# Patient Record
Sex: Male | Born: 1950 | Race: Black or African American | Hispanic: No | Marital: Married | Smoking: Former smoker
Health system: Southern US, Community
[De-identification: ages and names within clinical notes are randomized; demographics above are authoritative.]

## PROBLEM LIST (undated history)

## (undated) DIAGNOSIS — E785 Hyperlipidemia, unspecified: Secondary | ICD-10-CM

## (undated) DIAGNOSIS — I10 Essential (primary) hypertension: Secondary | ICD-10-CM

## (undated) DIAGNOSIS — Z973 Presence of spectacles and contact lenses: Secondary | ICD-10-CM

## (undated) DIAGNOSIS — H269 Unspecified cataract: Secondary | ICD-10-CM

## (undated) DIAGNOSIS — K219 Gastro-esophageal reflux disease without esophagitis: Secondary | ICD-10-CM

## (undated) DIAGNOSIS — H409 Unspecified glaucoma: Secondary | ICD-10-CM

## (undated) DIAGNOSIS — Z972 Presence of dental prosthetic device (complete) (partial): Secondary | ICD-10-CM

## (undated) DIAGNOSIS — M1990 Unspecified osteoarthritis, unspecified site: Secondary | ICD-10-CM

## (undated) DIAGNOSIS — F419 Anxiety disorder, unspecified: Secondary | ICD-10-CM

## (undated) DIAGNOSIS — D649 Anemia, unspecified: Secondary | ICD-10-CM

## (undated) DIAGNOSIS — N186 End stage renal disease: Secondary | ICD-10-CM

## (undated) DIAGNOSIS — J302 Other seasonal allergic rhinitis: Secondary | ICD-10-CM

## (undated) HISTORY — PX: MULTIPLE TOOTH EXTRACTIONS: SHX2053

## (undated) HISTORY — DX: Anemia, unspecified: D64.9

## (undated) HISTORY — PX: COLONOSCOPY: SHX174

## (undated) HISTORY — PX: HERNIA REPAIR: SHX51

---

## 2009-11-19 ENCOUNTER — Inpatient Hospital Stay (HOSPITAL_COMMUNITY): Admission: EM | Admit: 2009-11-19 | Discharge: 2009-12-13 | Payer: Self-pay | Admitting: Emergency Medicine

## 2009-11-19 ENCOUNTER — Ambulatory Visit: Payer: Self-pay | Admitting: Critical Care Medicine

## 2009-11-21 ENCOUNTER — Encounter (INDEPENDENT_AMBULATORY_CARE_PROVIDER_SITE_OTHER): Payer: Self-pay

## 2009-11-30 ENCOUNTER — Encounter (INDEPENDENT_AMBULATORY_CARE_PROVIDER_SITE_OTHER): Payer: Self-pay

## 2009-12-11 ENCOUNTER — Ambulatory Visit: Payer: Self-pay | Admitting: Vascular Surgery

## 2009-12-13 ENCOUNTER — Inpatient Hospital Stay: Admission: EM | Admit: 2009-12-13 | Discharge: 2010-01-03 | Payer: Self-pay | Admitting: Internal Medicine

## 2009-12-14 ENCOUNTER — Ambulatory Visit: Payer: Self-pay | Admitting: Infectious Diseases

## 2010-01-16 ENCOUNTER — Ambulatory Visit: Payer: Self-pay | Admitting: Vascular Surgery

## 2010-02-06 ENCOUNTER — Ambulatory Visit (HOSPITAL_COMMUNITY): Admission: RE | Admit: 2010-02-06 | Discharge: 2010-02-07 | Payer: Self-pay | Admitting: Surgery

## 2010-02-06 ENCOUNTER — Encounter (INDEPENDENT_AMBULATORY_CARE_PROVIDER_SITE_OTHER): Payer: Self-pay | Admitting: Surgery

## 2010-08-30 IMAGING — CR DG CHEST 2V
2 series · 2 of 2 positions shown · non-contrast
Comparison: 12/11/2009

CLINICAL DATA: Elevated white blood cell count.

CHEST - 2 VIEW

[w chest pa]
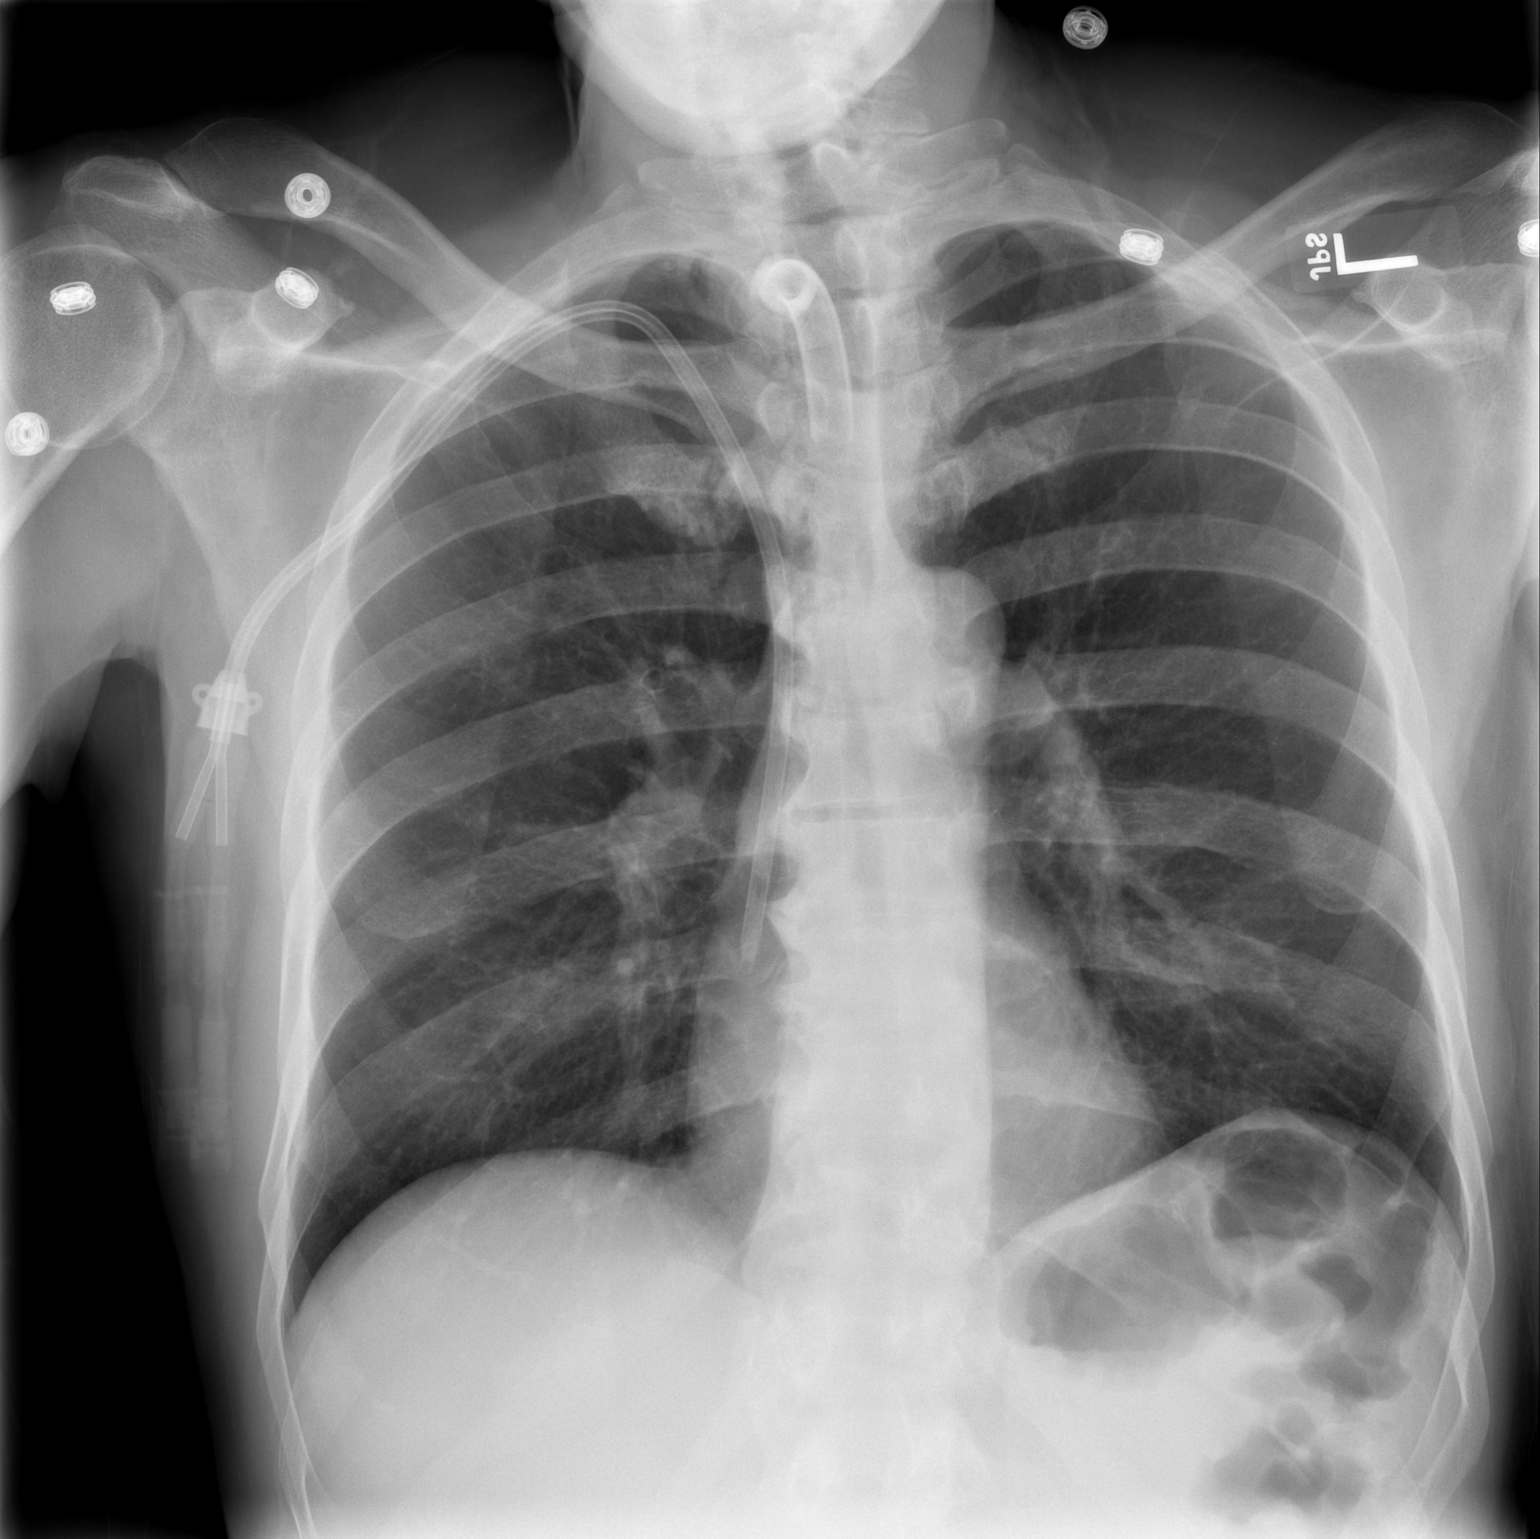

[w chest lat]
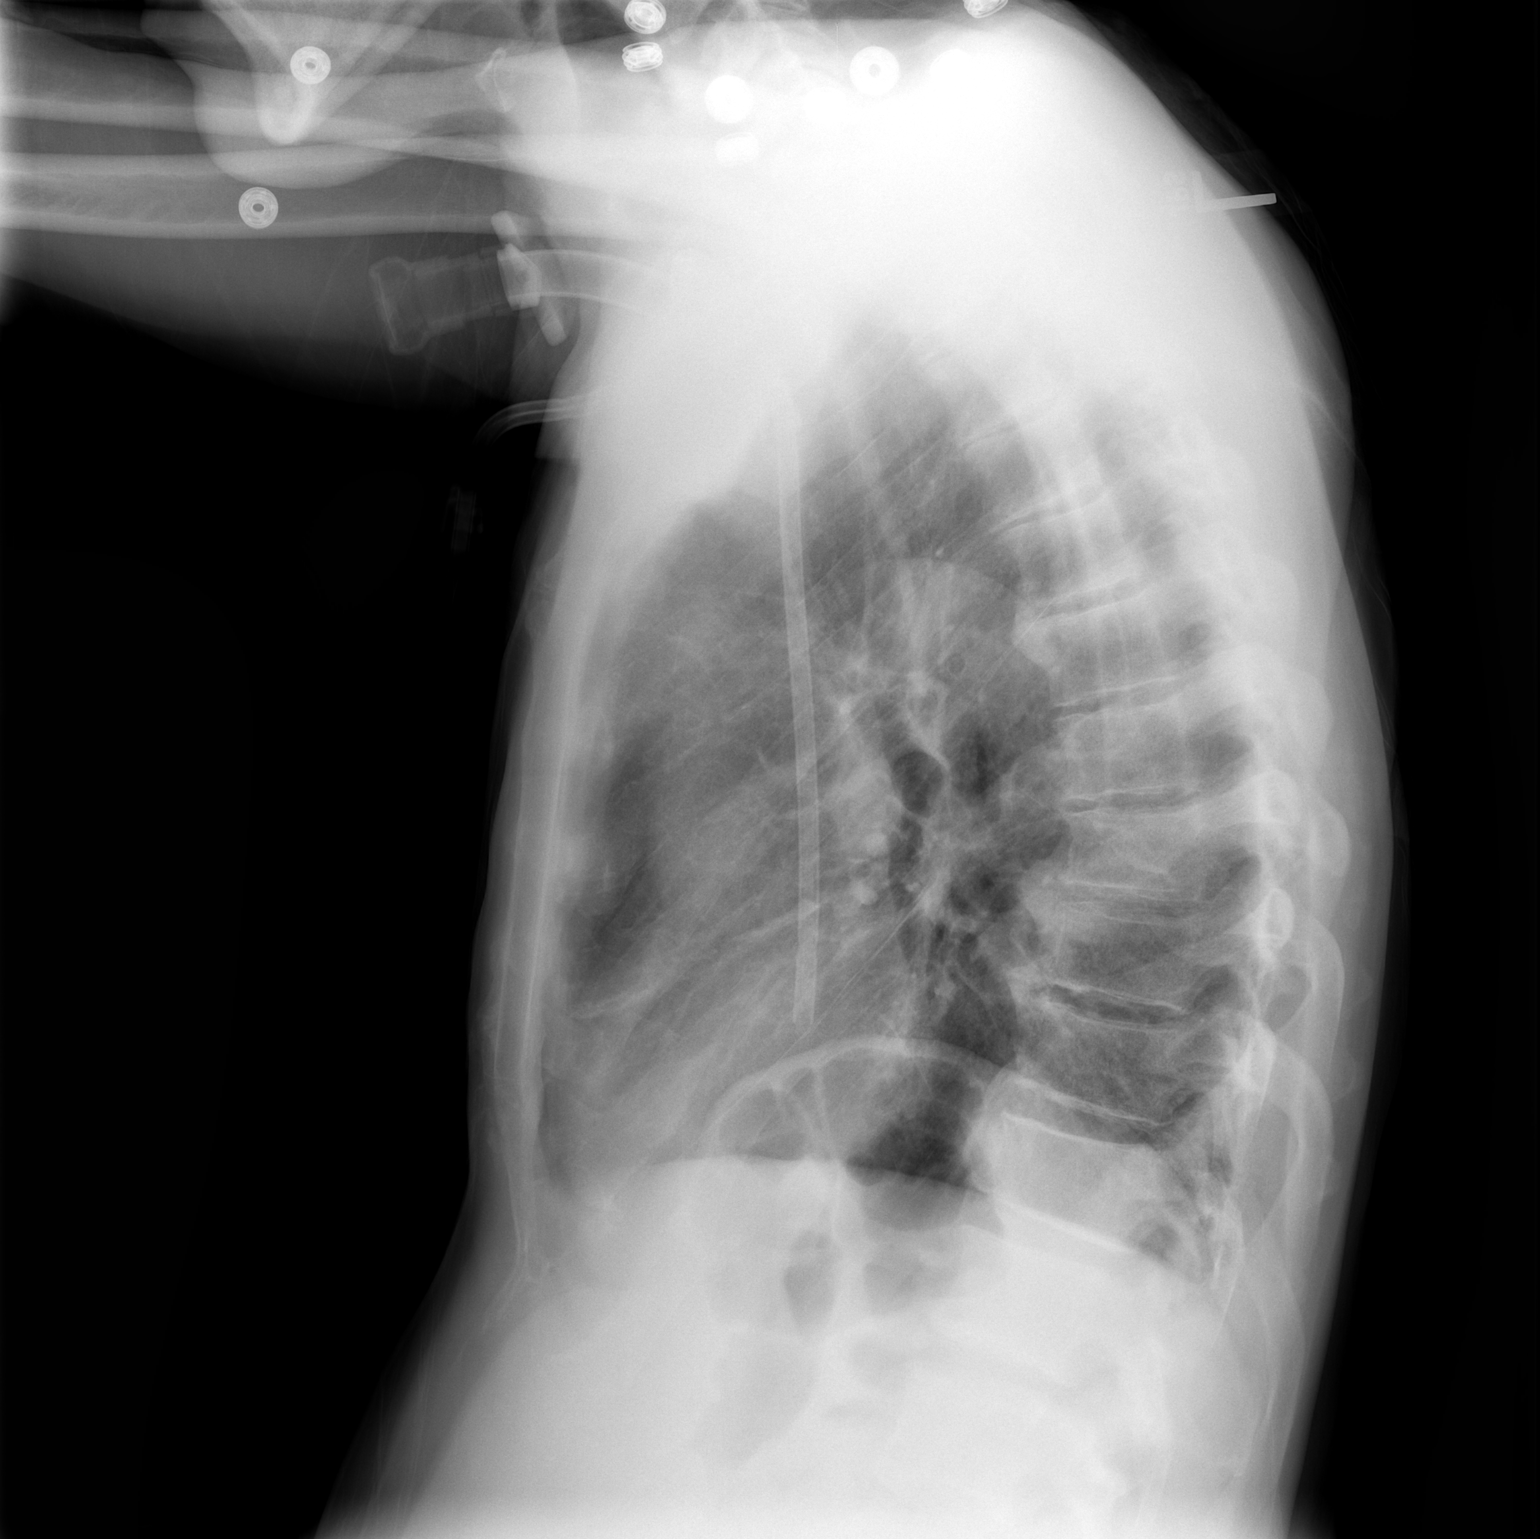

[2 of 2 positions shown; findings below may reference images not displayed]

FINDINGS: Right dialysis catheter and tracheostomy tube unchanged.
Heart and mediastinal contours are within normal limits.  No focal
opacities or effusions.  No acute bony abnormality.
IMPRESSION: No acute cardiopulmonary disease.

## 2010-09-01 IMAGING — NM NM LIVER FUNCTION STUDY
2 series · 12 of 12 positions shown · non-contrast
Comparison: Ultrasound 12/15/2009

CLINICAL DATA: Abdominal pain.  Evaluate for acute cholecystitis.

NUCLEAR MEDICINE HEPATOBILIARY IMAGING
TECHNIQUE: Sequential images of the abdomen were obtained [DATE] minutes following intravenous administration of
radiopharmaceutical.
Radiopharmaceutical:  5.0 mCi 3c-LLm Choletec

[he hepatobiliary · 3.43mm/px · 6 of 60 frames shown (1 of 2)]
[frame 6/60]
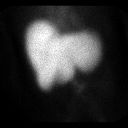
[frame 16/60]
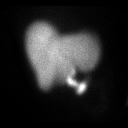
[frame 26/60]
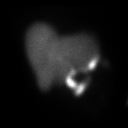
[frame 36/60]
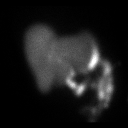
[frame 46/60]
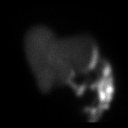
[frame 56/60]
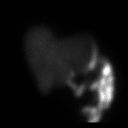

[he hepatobiliary · 3.43mm/px · 6 of 28 frames shown (2 of 2)]
[frame 3/28]
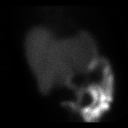
[frame 7/28]
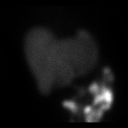
[frame 12/28]
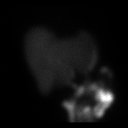
[frame 17/28]
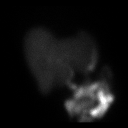
[frame 21/28]
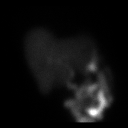
[frame 26/28]
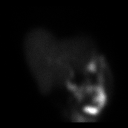

[12 of 12 positions shown; findings below may reference images not displayed]

FINDINGS: There is prompt uptake and excretion of radiotracer by
the liver.  Activity is seen in small bowel by 25 minutes.  No
filling of the gallbladder noted at 1 hour.  Therefore, the patient
was given 2.8 mg of morphine IV.  After 30 minutes, there is still
no filling of the gallbladder.
IMPRESSION: Nonfilling of the gallbladder despite morphine administration.
Findings compatible with cystic duct obstruction.

## 2011-01-08 LAB — COMPREHENSIVE METABOLIC PANEL
ALT: 21 U/L (ref 0–53)
AST: 25 U/L (ref 0–37)
Albumin: 4.6 g/dL (ref 3.5–5.2)
BUN: 7 mg/dL (ref 6–23)
Chloride: 102 mEq/L (ref 96–112)
Creatinine, Ser: 1 mg/dL (ref 0.4–1.5)
GFR calc non Af Amer: >60 mL/min (ref >60)
Sodium: 136 mEq/L (ref 135–145)
Total Bilirubin: 1 mg/dL (ref 0.3–1.2)

## 2011-01-08 LAB — DIFFERENTIAL
Basophils Absolute: 0 10*3/uL (ref 0.0–0.1)
Eosinophils Absolute: 0 10*3/uL (ref 0.0–0.7)
Eosinophils Relative: 0 % (ref 0–5)
Eosinophils Relative: 0 % (ref 0–5)
Lymphocytes Relative: 2 % — ABNORMAL LOW (ref 12–46)
Lymphs Abs: 0.1 10*3/uL — ABNORMAL LOW (ref 0.7–4.0)
Lymphs Abs: 0.4 10*3/uL — ABNORMAL LOW (ref 0.7–4.0)
Monocytes Absolute: 0.1 10*3/uL (ref 0.1–1.0)
Monocytes Relative: 2 % — ABNORMAL LOW (ref 3–12)
Neutrophils Relative %: 82 % — ABNORMAL HIGH (ref 43–77)

## 2011-01-08 LAB — BLOOD GAS, ARTERIAL
Acid-base deficit: 0.5 mmol/L (ref 0.0–2.0)
Drawn by: 13898
FIO2: 0.5 %
MECHVT: 600 mL
MECHVT: 600 mL
O2 Saturation: 84 %
PEEP: 8 cmH2O
Patient temperature: 101.6
RATE: 15 resp/min
TCO2: 24.9 mmol/L (ref 0–100)
TCO2: 25.5 mmol/L (ref 0–100)
pCO2 arterial: 43.5 mmHg (ref 35.0–45.0)
pCO2 arterial: 50.1 mmHg — ABNORMAL HIGH (ref 35.0–45.0)
pH, Arterial: 7.313 — ABNORMAL LOW (ref 7.350–7.450)
pO2, Arterial: 55.9 mmHg — ABNORMAL LOW (ref 80.0–100.0)

## 2011-01-08 LAB — CBC
HCT: 39 % (ref 39.0–52.0)
HCT: 42.5 % (ref 39.0–52.0)
Hemoglobin: 13.4 g/dL (ref 13.0–17.0)
Hemoglobin: 14.2 g/dL (ref 13.0–17.0)
MCHC: 33.5 g/dL (ref 30.0–36.0)
MCHC: 34.5 g/dL (ref 30.0–36.0)
MCV: 95.8 fL (ref 78.0–100.0)
Platelets: 128 10*3/uL — ABNORMAL LOW (ref 150–400)
RBC: 4 MIL/uL — ABNORMAL LOW (ref 4.22–5.81)
RBC: 4.44 MIL/uL (ref 4.22–5.81)
RDW: 12.1 % (ref 11.5–15.5)
RDW: 12.2 % (ref 11.5–15.5)
WBC: 5.8 10*3/uL (ref 4.0–10.5)

## 2011-01-08 LAB — CULTURE, BAL-QUANTITATIVE W GRAM STAIN: Colony Count: >=100000

## 2011-01-08 LAB — BASIC METABOLIC PANEL
BUN: 7 mg/dL (ref 6–23)
Calcium: 7.9 mg/dL — ABNORMAL LOW (ref 8.4–10.5)
Chloride: 106 mEq/L (ref 96–112)
Creatinine, Ser: 1.16 mg/dL (ref 0.4–1.5)
GFR calc Af Amer: >60 mL/min (ref >60)

## 2011-01-08 LAB — CULTURE, BLOOD (ROUTINE X 2)

## 2011-01-09 LAB — COMPREHENSIVE METABOLIC PANEL
Alkaline Phosphatase: 81 U/L (ref 39–117)
BUN: 11 mg/dL (ref 6–23)
Calcium: 8.2 mg/dL — ABNORMAL LOW (ref 8.4–10.5)
Creatinine, Ser: 1.9 mg/dL — ABNORMAL HIGH (ref 0.4–1.5)
Glucose, Bld: 78 mg/dL (ref 70–99)
Total Protein: 4.7 g/dL — ABNORMAL LOW (ref 6.0–8.3)

## 2011-01-09 LAB — CBC
HCT: 33.1 % — ABNORMAL LOW (ref 39.0–52.0)
Hemoglobin: 11.1 g/dL — ABNORMAL LOW (ref 13.0–17.0)
MCHC: 33.6 g/dL (ref 30.0–36.0)
MCV: 90.1 fL (ref 78.0–100.0)
RDW: 15.8 % — ABNORMAL HIGH (ref 11.5–15.5)

## 2011-01-10 LAB — DIFFERENTIAL
Basophils Absolute: 0 10*3/uL (ref 0.0–0.1)
Basophils Relative: 0 % (ref 0–1)
Eosinophils Absolute: 0.2 10*3/uL (ref 0.0–0.7)
Eosinophils Relative: 3 % (ref 0–5)
Lymphocytes Relative: 32 % (ref 12–46)

## 2011-01-10 LAB — COMPREHENSIVE METABOLIC PANEL
ALT: 24 U/L (ref 0–53)
AST: 21 U/L (ref 0–37)
CO2: 26 mEq/L (ref 19–32)
Chloride: 102 mEq/L (ref 96–112)
Creatinine, Ser: 2.09 mg/dL — ABNORMAL HIGH (ref 0.4–1.5)
GFR calc Af Amer: 40 mL/min — ABNORMAL LOW (ref >60)
GFR calc non Af Amer: 33 mL/min — ABNORMAL LOW (ref >60)
Total Bilirubin: 0.6 mg/dL (ref 0.3–1.2)

## 2011-01-10 LAB — CBC
HCT: 38.6 % — ABNORMAL LOW (ref 39.0–52.0)
MCV: 90.6 fL (ref 78.0–100.0)
RBC: 4.26 MIL/uL (ref 4.22–5.81)
WBC: 6.5 10*3/uL (ref 4.0–10.5)

## 2011-01-11 LAB — BASIC METABOLIC PANEL
BUN: 14 mg/dL (ref 6–23)
BUN: 19 mg/dL (ref 6–23)
BUN: 34 mg/dL — ABNORMAL HIGH (ref 6–23)
BUN: 56 mg/dL — ABNORMAL HIGH (ref 6–23)
BUN: 72 mg/dL — ABNORMAL HIGH (ref 6–23)
BUN: 88 mg/dL — ABNORMAL HIGH (ref 6–23)
CO2: 19 mEq/L (ref 19–32)
CO2: 22 mEq/L (ref 19–32)
CO2: 23 mEq/L (ref 19–32)
CO2: 24 mEq/L (ref 19–32)
CO2: 24 mEq/L (ref 19–32)
CO2: 30 mEq/L (ref 19–32)
Calcium: 7 mg/dL — ABNORMAL LOW (ref 8.4–10.5)
Calcium: 7.5 mg/dL — ABNORMAL LOW (ref 8.4–10.5)
Calcium: 7.5 mg/dL — ABNORMAL LOW (ref 8.4–10.5)
Calcium: 7.5 mg/dL — ABNORMAL LOW (ref 8.4–10.5)
Calcium: 8.1 mg/dL — ABNORMAL LOW (ref 8.4–10.5)
Calcium: 8.2 mg/dL — ABNORMAL LOW (ref 8.4–10.5)
Calcium: 8.6 mg/dL (ref 8.4–10.5)
Calcium: 8.7 mg/dL (ref 8.4–10.5)
Chloride: 100 mEq/L (ref 96–112)
Chloride: 111 mEq/L (ref 96–112)
Chloride: 111 mEq/L (ref 96–112)
Chloride: 112 mEq/L (ref 96–112)
Chloride: 112 mEq/L (ref 96–112)
Chloride: 97 mEq/L (ref 96–112)
Chloride: 97 mEq/L (ref 96–112)
Chloride: 99 mEq/L (ref 96–112)
Creatinine, Ser: 1.36 mg/dL (ref 0.4–1.5)
Creatinine, Ser: 11.66 mg/dL — ABNORMAL HIGH (ref 0.4–1.5)
Creatinine, Ser: 2.33 mg/dL — ABNORMAL HIGH (ref 0.4–1.5)
Creatinine, Ser: 5.21 mg/dL — ABNORMAL HIGH (ref 0.4–1.5)
Creatinine, Ser: 8.23 mg/dL — ABNORMAL HIGH (ref 0.4–1.5)
Creatinine, Ser: 8.64 mg/dL — ABNORMAL HIGH (ref 0.4–1.5)
Creatinine, Ser: 9.42 mg/dL — ABNORMAL HIGH (ref 0.4–1.5)
GFR calc Af Amer: 12 mL/min — ABNORMAL LOW (ref >60)
GFR calc Af Amer: 14 mL/min — ABNORMAL LOW (ref >60)
GFR calc Af Amer: 15 mL/min — ABNORMAL LOW (ref >60)
GFR calc Af Amer: 5 mL/min — ABNORMAL LOW (ref >60)
GFR calc Af Amer: 6 mL/min — ABNORMAL LOW (ref >60)
GFR calc Af Amer: 7 mL/min — ABNORMAL LOW (ref >60)
GFR calc Af Amer: 8 mL/min — ABNORMAL LOW (ref >60)
GFR calc Af Amer: 8 mL/min — ABNORMAL LOW (ref >60)
GFR calc non Af Amer: 10 mL/min — ABNORMAL LOW (ref >60)
GFR calc non Af Amer: 14 mL/min — ABNORMAL LOW (ref >60)
GFR calc non Af Amer: 29 mL/min — ABNORMAL LOW (ref >60)
GFR calc non Af Amer: 5 mL/min — ABNORMAL LOW (ref >60)
GFR calc non Af Amer: 54 mL/min — ABNORMAL LOW (ref >60)
GFR calc non Af Amer: 6 mL/min — ABNORMAL LOW (ref >60)
GFR calc non Af Amer: 6 mL/min — ABNORMAL LOW (ref >60)
GFR calc non Af Amer: 7 mL/min — ABNORMAL LOW (ref >60)
GFR calc non Af Amer: 8 mL/min — ABNORMAL LOW (ref >60)
Glucose, Bld: 110 mg/dL — ABNORMAL HIGH (ref 70–99)
Glucose, Bld: 125 mg/dL — ABNORMAL HIGH (ref 70–99)
Glucose, Bld: 131 mg/dL — ABNORMAL HIGH (ref 70–99)
Glucose, Bld: 142 mg/dL — ABNORMAL HIGH (ref 70–99)
Glucose, Bld: 152 mg/dL — ABNORMAL HIGH (ref 70–99)
Potassium: 3.4 mEq/L — ABNORMAL LOW (ref 3.5–5.1)
Potassium: 3.5 mEq/L (ref 3.5–5.1)
Potassium: 3.6 mEq/L (ref 3.5–5.1)
Potassium: 3.9 mEq/L (ref 3.5–5.1)
Potassium: 5.7 mEq/L — ABNORMAL HIGH (ref 3.5–5.1)
Sodium: 130 mEq/L — ABNORMAL LOW (ref 135–145)
Sodium: 132 mEq/L — ABNORMAL LOW (ref 135–145)
Sodium: 133 mEq/L — ABNORMAL LOW (ref 135–145)
Sodium: 136 mEq/L (ref 135–145)
Sodium: 137 mEq/L (ref 135–145)
Sodium: 138 mEq/L (ref 135–145)
Sodium: 139 mEq/L (ref 135–145)
Sodium: 139 mEq/L (ref 135–145)
Sodium: 139 mEq/L (ref 135–145)
Sodium: 139 mEq/L (ref 135–145)

## 2011-01-11 LAB — GLUCOSE, CAPILLARY
Glucose-Capillary: 100 mg/dL — ABNORMAL HIGH (ref 70–99)
Glucose-Capillary: 104 mg/dL — ABNORMAL HIGH (ref 70–99)
Glucose-Capillary: 105 mg/dL — ABNORMAL HIGH (ref 70–99)
Glucose-Capillary: 105 mg/dL — ABNORMAL HIGH (ref 70–99)
Glucose-Capillary: 105 mg/dL — ABNORMAL HIGH (ref 70–99)
Glucose-Capillary: 107 mg/dL — ABNORMAL HIGH (ref 70–99)
Glucose-Capillary: 109 mg/dL — ABNORMAL HIGH (ref 70–99)
Glucose-Capillary: 110 mg/dL — ABNORMAL HIGH (ref 70–99)
Glucose-Capillary: 116 mg/dL — ABNORMAL HIGH (ref 70–99)
Glucose-Capillary: 118 mg/dL — ABNORMAL HIGH (ref 70–99)
Glucose-Capillary: 120 mg/dL — ABNORMAL HIGH (ref 70–99)
Glucose-Capillary: 122 mg/dL — ABNORMAL HIGH (ref 70–99)
Glucose-Capillary: 123 mg/dL — ABNORMAL HIGH (ref 70–99)
Glucose-Capillary: 123 mg/dL — ABNORMAL HIGH (ref 70–99)
Glucose-Capillary: 125 mg/dL — ABNORMAL HIGH (ref 70–99)
Glucose-Capillary: 125 mg/dL — ABNORMAL HIGH (ref 70–99)
Glucose-Capillary: 126 mg/dL — ABNORMAL HIGH (ref 70–99)
Glucose-Capillary: 129 mg/dL — ABNORMAL HIGH (ref 70–99)
Glucose-Capillary: 132 mg/dL — ABNORMAL HIGH (ref 70–99)
Glucose-Capillary: 133 mg/dL — ABNORMAL HIGH (ref 70–99)
Glucose-Capillary: 133 mg/dL — ABNORMAL HIGH (ref 70–99)
Glucose-Capillary: 133 mg/dL — ABNORMAL HIGH (ref 70–99)
Glucose-Capillary: 134 mg/dL — ABNORMAL HIGH (ref 70–99)
Glucose-Capillary: 136 mg/dL — ABNORMAL HIGH (ref 70–99)
Glucose-Capillary: 136 mg/dL — ABNORMAL HIGH (ref 70–99)
Glucose-Capillary: 137 mg/dL — ABNORMAL HIGH (ref 70–99)
Glucose-Capillary: 138 mg/dL — ABNORMAL HIGH (ref 70–99)
Glucose-Capillary: 138 mg/dL — ABNORMAL HIGH (ref 70–99)
Glucose-Capillary: 138 mg/dL — ABNORMAL HIGH (ref 70–99)
Glucose-Capillary: 140 mg/dL — ABNORMAL HIGH (ref 70–99)
Glucose-Capillary: 141 mg/dL — ABNORMAL HIGH (ref 70–99)
Glucose-Capillary: 141 mg/dL — ABNORMAL HIGH (ref 70–99)
Glucose-Capillary: 141 mg/dL — ABNORMAL HIGH (ref 70–99)
Glucose-Capillary: 142 mg/dL — ABNORMAL HIGH (ref 70–99)
Glucose-Capillary: 144 mg/dL — ABNORMAL HIGH (ref 70–99)
Glucose-Capillary: 144 mg/dL — ABNORMAL HIGH (ref 70–99)
Glucose-Capillary: 146 mg/dL — ABNORMAL HIGH (ref 70–99)
Glucose-Capillary: 147 mg/dL — ABNORMAL HIGH (ref 70–99)
Glucose-Capillary: 149 mg/dL — ABNORMAL HIGH (ref 70–99)
Glucose-Capillary: 149 mg/dL — ABNORMAL HIGH (ref 70–99)
Glucose-Capillary: 151 mg/dL — ABNORMAL HIGH (ref 70–99)
Glucose-Capillary: 152 mg/dL — ABNORMAL HIGH (ref 70–99)
Glucose-Capillary: 152 mg/dL — ABNORMAL HIGH (ref 70–99)
Glucose-Capillary: 152 mg/dL — ABNORMAL HIGH (ref 70–99)
Glucose-Capillary: 154 mg/dL — ABNORMAL HIGH (ref 70–99)
Glucose-Capillary: 155 mg/dL — ABNORMAL HIGH (ref 70–99)
Glucose-Capillary: 155 mg/dL — ABNORMAL HIGH (ref 70–99)
Glucose-Capillary: 156 mg/dL — ABNORMAL HIGH (ref 70–99)
Glucose-Capillary: 156 mg/dL — ABNORMAL HIGH (ref 70–99)
Glucose-Capillary: 161 mg/dL — ABNORMAL HIGH (ref 70–99)
Glucose-Capillary: 167 mg/dL — ABNORMAL HIGH (ref 70–99)
Glucose-Capillary: 167 mg/dL — ABNORMAL HIGH (ref 70–99)
Glucose-Capillary: 168 mg/dL — ABNORMAL HIGH (ref 70–99)
Glucose-Capillary: 169 mg/dL — ABNORMAL HIGH (ref 70–99)
Glucose-Capillary: 172 mg/dL — ABNORMAL HIGH (ref 70–99)
Glucose-Capillary: 173 mg/dL — ABNORMAL HIGH (ref 70–99)
Glucose-Capillary: 181 mg/dL — ABNORMAL HIGH (ref 70–99)
Glucose-Capillary: 195 mg/dL — ABNORMAL HIGH (ref 70–99)
Glucose-Capillary: 227 mg/dL — ABNORMAL HIGH (ref 70–99)
Glucose-Capillary: 71 mg/dL (ref 70–99)
Glucose-Capillary: 72 mg/dL (ref 70–99)
Glucose-Capillary: 95 mg/dL (ref 70–99)
Glucose-Capillary: 95 mg/dL (ref 70–99)
Glucose-Capillary: 96 mg/dL (ref 70–99)
Glucose-Capillary: 98 mg/dL (ref 70–99)
Glucose-Capillary: 98 mg/dL (ref 70–99)

## 2011-01-11 LAB — BLOOD GAS, ARTERIAL
Acid-base deficit: 1.3 mmol/L (ref 0.0–2.0)
Acid-base deficit: 7 mmol/L — ABNORMAL HIGH (ref 0.0–2.0)
Acid-base deficit: 8.6 mmol/L — ABNORMAL HIGH (ref 0.0–2.0)
Acid-base deficit: 8.9 mmol/L — ABNORMAL HIGH (ref 0.0–2.0)
Bicarbonate: 16.1 mEq/L — ABNORMAL LOW (ref 20.0–24.0)
FIO2: 0.28 %
FIO2: 0.4 %
FIO2: 0.5 %
FIO2: 100 %
MECHVT: 450 mL
MECHVT: 450 mL
MECHVT: 450 mL
O2 Saturation: 91.6 %
O2 Saturation: 98 %
O2 Saturation: 99.5 %
PEEP: 5 cmH2O
PEEP: 5 cmH2O
Patient temperature: 100.2
RATE: 20 resp/min
RATE: 20 resp/min
RATE: 20 resp/min
TCO2: 17.1 mmol/L (ref 0–100)
TCO2: 17.4 mmol/L (ref 0–100)
TCO2: 24.7 mmol/L (ref 0–100)
pCO2 arterial: 33.3 mmHg — ABNORMAL LOW (ref 35.0–45.0)
pCO2 arterial: 35.6 mmHg (ref 35.0–45.0)
pCO2 arterial: 36.5 mmHg (ref 35.0–45.0)
pCO2 arterial: 39.1 mmHg (ref 35.0–45.0)
pH, Arterial: 7.42 (ref 7.350–7.450)
pO2, Arterial: 155 mmHg — ABNORMAL HIGH (ref 80.0–100.0)
pO2, Arterial: 56.9 mmHg — ABNORMAL LOW (ref 80.0–100.0)
pO2, Arterial: 64.2 mmHg — ABNORMAL LOW (ref 80.0–100.0)
pO2, Arterial: 89.2 mmHg (ref 80.0–100.0)

## 2011-01-11 LAB — CBC
HCT: 17.8 % — ABNORMAL LOW (ref 39.0–52.0)
HCT: 18.6 % — ABNORMAL LOW (ref 39.0–52.0)
HCT: 20.4 % — ABNORMAL LOW (ref 39.0–52.0)
HCT: 20.8 % — ABNORMAL LOW (ref 39.0–52.0)
HCT: 20.9 % — ABNORMAL LOW (ref 39.0–52.0)
HCT: 21.9 % — ABNORMAL LOW (ref 39.0–52.0)
HCT: 21.9 % — ABNORMAL LOW (ref 39.0–52.0)
HCT: 24.4 % — ABNORMAL LOW (ref 39.0–52.0)
HCT: 24.4 % — ABNORMAL LOW (ref 39.0–52.0)
HCT: 29.8 % — ABNORMAL LOW (ref 39.0–52.0)
HCT: 30 % — ABNORMAL LOW (ref 39.0–52.0)
HCT: 32.4 % — ABNORMAL LOW (ref 39.0–52.0)
Hemoglobin: 10.1 g/dL — ABNORMAL LOW (ref 13.0–17.0)
Hemoglobin: 10.2 g/dL — ABNORMAL LOW (ref 13.0–17.0)
Hemoglobin: 10.9 g/dL — ABNORMAL LOW (ref 13.0–17.0)
Hemoglobin: 6.3 g/dL — CL (ref 13.0–17.0)
Hemoglobin: 7 g/dL — ABNORMAL LOW (ref 13.0–17.0)
Hemoglobin: 7.3 g/dL — ABNORMAL LOW (ref 13.0–17.0)
Hemoglobin: 7.5 g/dL — ABNORMAL LOW (ref 13.0–17.0)
Hemoglobin: 7.6 g/dL — ABNORMAL LOW (ref 13.0–17.0)
Hemoglobin: 7.7 g/dL — ABNORMAL LOW (ref 13.0–17.0)
Hemoglobin: 7.9 g/dL — ABNORMAL LOW (ref 13.0–17.0)
Hemoglobin: 8.4 g/dL — ABNORMAL LOW (ref 13.0–17.0)
Hemoglobin: 8.5 g/dL — ABNORMAL LOW (ref 13.0–17.0)
Hemoglobin: 8.7 g/dL — ABNORMAL LOW (ref 13.0–17.0)
Hemoglobin: 9.4 g/dL — ABNORMAL LOW (ref 13.0–17.0)
Hemoglobin: 9.8 g/dL — ABNORMAL LOW (ref 13.0–17.0)
MCHC: 33.5 g/dL (ref 30.0–36.0)
MCHC: 34 g/dL (ref 30.0–36.0)
MCHC: 34 g/dL (ref 30.0–36.0)
MCHC: 34.2 g/dL (ref 30.0–36.0)
MCHC: 34.5 g/dL (ref 30.0–36.0)
MCHC: 34.8 g/dL (ref 30.0–36.0)
MCHC: 34.9 g/dL (ref 30.0–36.0)
MCHC: 35.2 g/dL (ref 30.0–36.0)
MCHC: 35.2 g/dL (ref 30.0–36.0)
MCHC: 35.4 g/dL (ref 30.0–36.0)
MCHC: 35.5 g/dL (ref 30.0–36.0)
MCV: 89.6 fL (ref 78.0–100.0)
MCV: 89.8 fL (ref 78.0–100.0)
MCV: 90 fL (ref 78.0–100.0)
MCV: 90.1 fL (ref 78.0–100.0)
MCV: 91.3 fL (ref 78.0–100.0)
MCV: 91.4 fL (ref 78.0–100.0)
MCV: 92 fL (ref 78.0–100.0)
MCV: 92.1 fL (ref 78.0–100.0)
MCV: 92.9 fL (ref 78.0–100.0)
MCV: 93.6 fL (ref 78.0–100.0)
MCV: 97.4 fL (ref 78.0–100.0)
MCV: 97.6 fL (ref 78.0–100.0)
MCV: 97.8 fL (ref 78.0–100.0)
MCV: 98.2 fL (ref 78.0–100.0)
Platelets: 124 10*3/uL — ABNORMAL LOW (ref 150–400)
Platelets: 177 10*3/uL (ref 150–400)
Platelets: 404 K/uL — ABNORMAL HIGH (ref 150–400)
Platelets: 501 10*3/uL — ABNORMAL HIGH (ref 150–400)
Platelets: 534 10*3/uL — ABNORMAL HIGH (ref 150–400)
Platelets: 534 10*3/uL — ABNORMAL HIGH (ref 150–400)
Platelets: 555 10*3/uL — ABNORMAL HIGH (ref 150–400)
Platelets: 559 10*3/uL — ABNORMAL HIGH (ref 150–400)
Platelets: 570 10*3/uL — ABNORMAL HIGH (ref 150–400)
Platelets: 609 10*3/uL — ABNORMAL HIGH (ref 150–400)
Platelets: 624 10*3/uL — ABNORMAL HIGH (ref 150–400)
Platelets: 635 10*3/uL — ABNORMAL HIGH (ref 150–400)
Platelets: 637 10*3/uL — ABNORMAL HIGH (ref 150–400)
Platelets: 756 10*3/uL — ABNORMAL HIGH (ref 150–400)
RBC: 2.13 MIL/uL — ABNORMAL LOW (ref 4.22–5.81)
RBC: 2.18 MIL/uL — ABNORMAL LOW (ref 4.22–5.81)
RBC: 2.25 MIL/uL — ABNORMAL LOW (ref 4.22–5.81)
RBC: 2.25 MIL/uL — ABNORMAL LOW (ref 4.22–5.81)
RBC: 2.38 MIL/uL — ABNORMAL LOW (ref 4.22–5.81)
RBC: 2.5 MIL/uL — ABNORMAL LOW (ref 4.22–5.81)
RBC: 2.54 MIL/uL — ABNORMAL LOW (ref 4.22–5.81)
RBC: 2.6 MIL/uL — ABNORMAL LOW (ref 4.22–5.81)
RBC: 2.68 MIL/uL — ABNORMAL LOW (ref 4.22–5.81)
RBC: 2.71 MIL/uL — ABNORMAL LOW (ref 4.22–5.81)
RBC: 2.87 MIL/uL — ABNORMAL LOW (ref 4.22–5.81)
RBC: 2.99 MIL/uL — ABNORMAL LOW (ref 4.22–5.81)
RBC: 3.02 MIL/uL — ABNORMAL LOW (ref 4.22–5.81)
RBC: 3.33 MIL/uL — ABNORMAL LOW (ref 4.22–5.81)
RDW: 12.9 % (ref 11.5–15.5)
RDW: 13 % (ref 11.5–15.5)
RDW: 13 % (ref 11.5–15.5)
RDW: 13 % (ref 11.5–15.5)
RDW: 13.1 % (ref 11.5–15.5)
RDW: 13.2 % (ref 11.5–15.5)
RDW: 13.9 % (ref 11.5–15.5)
RDW: 14 % (ref 11.5–15.5)
RDW: 14.2 % (ref 11.5–15.5)
RDW: 15.9 % — ABNORMAL HIGH (ref 11.5–15.5)
RDW: 16.3 % — ABNORMAL HIGH (ref 11.5–15.5)
RDW: 16.3 % — ABNORMAL HIGH (ref 11.5–15.5)
RDW: 17 % — ABNORMAL HIGH (ref 11.5–15.5)
WBC: 10.3 10*3/uL (ref 4.0–10.5)
WBC: 16.1 10*3/uL — ABNORMAL HIGH (ref 4.0–10.5)
WBC: 16.6 10*3/uL — ABNORMAL HIGH (ref 4.0–10.5)
WBC: 16.7 10*3/uL — ABNORMAL HIGH (ref 4.0–10.5)
WBC: 17.2 10*3/uL — ABNORMAL HIGH (ref 4.0–10.5)
WBC: 19 10*3/uL — ABNORMAL HIGH (ref 4.0–10.5)
WBC: 19.3 10*3/uL — ABNORMAL HIGH (ref 4.0–10.5)
WBC: 19.4 10*3/uL — ABNORMAL HIGH (ref 4.0–10.5)
WBC: 20.8 10*3/uL — ABNORMAL HIGH (ref 4.0–10.5)
WBC: 22.7 10*3/uL — ABNORMAL HIGH (ref 4.0–10.5)
WBC: 23.1 10*3/uL — ABNORMAL HIGH (ref 4.0–10.5)
WBC: 25.6 10*3/uL — ABNORMAL HIGH (ref 4.0–10.5)
WBC: 25.7 10*3/uL — ABNORMAL HIGH (ref 4.0–10.5)
WBC: 39.5 10*3/uL — ABNORMAL HIGH (ref 4.0–10.5)
WBC: 46.9 10*3/uL — ABNORMAL HIGH (ref 4.0–10.5)
WBC: 8.2 10*3/uL (ref 4.0–10.5)
WBC: 8.6 10*3/uL (ref 4.0–10.5)

## 2011-01-11 LAB — CULTURE, BLOOD (ROUTINE X 2)
Culture: NO GROWTH
Culture: NO GROWTH
Culture: NO GROWTH
Culture: NO GROWTH
Culture: NO GROWTH
Culture: NO GROWTH
Culture: NO GROWTH

## 2011-01-11 LAB — CROSSMATCH: ABO/RH(D): O POS

## 2011-01-11 LAB — POCT I-STAT EG7
Acid-base deficit: 3 mmol/L — ABNORMAL HIGH (ref 0.0–2.0)
Acid-base deficit: 4 mmol/L — ABNORMAL HIGH (ref 0.0–2.0)
Acid-base deficit: 9 mmol/L — ABNORMAL HIGH (ref 0.0–2.0)
Bicarbonate: 18.2 mEq/L — ABNORMAL LOW (ref 20.0–24.0)
Bicarbonate: 23 mEq/L (ref 20.0–24.0)
Bicarbonate: 25.1 mEq/L — ABNORMAL HIGH (ref 20.0–24.0)
Calcium, Ion: 0.46 mmol/L — CL (ref 1.12–1.32)
Calcium, Ion: 1.11 mmol/L — ABNORMAL LOW (ref 1.12–1.32)
Calcium, Ion: 1.14 mmol/L (ref 1.12–1.32)
HCT: 27 % — ABNORMAL LOW (ref 39.0–52.0)
HCT: 28 % — ABNORMAL LOW (ref 39.0–52.0)
HCT: 32 % — ABNORMAL LOW (ref 39.0–52.0)
Hemoglobin: 10.5 g/dL — ABNORMAL LOW (ref 13.0–17.0)
Hemoglobin: 9.2 g/dL — ABNORMAL LOW (ref 13.0–17.0)
Hemoglobin: 9.9 g/dL — ABNORMAL LOW (ref 13.0–17.0)
O2 Saturation: 50 %
O2 Saturation: 58 %
Patient temperature: 97.9
Patient temperature: 97.9
Patient temperature: 98
Potassium: 3.6 mEq/L (ref 3.5–5.1)
Potassium: 3.8 mEq/L (ref 3.5–5.1)
Sodium: 138 mEq/L (ref 135–145)
Sodium: 139 mEq/L (ref 135–145)
Sodium: 139 mEq/L (ref 135–145)
TCO2: 24 mmol/L (ref 0–100)
TCO2: 25 mmol/L (ref 0–100)
TCO2: 27 mmol/L (ref 0–100)
pCO2, Ven: 43.6 mmHg — ABNORMAL LOW (ref 45.0–50.0)
pCO2, Ven: 47.5 mmHg (ref 45.0–50.0)
pCO2, Ven: 49.6 mmHg (ref 45.0–50.0)
pH, Ven: 7.227 — ABNORMAL LOW (ref 7.250–7.300)
pH, Ven: 7.271 (ref 7.250–7.300)
pH, Ven: 7.282 (ref 7.250–7.300)
pO2, Ven: 32 mmHg (ref 30.0–45.0)
pO2, Ven: 33 mmHg (ref 30.0–45.0)
pO2, Ven: 34 mmHg (ref 30.0–45.0)

## 2011-01-11 LAB — IRON AND TIBC
Iron: 17 ug/dL — ABNORMAL LOW (ref 42–135)
TIBC: 217 ug/dL (ref 215–435)

## 2011-01-11 LAB — MAGNESIUM
Magnesium: 1.8 mg/dL (ref 1.5–2.5)
Magnesium: 1.9 mg/dL (ref 1.5–2.5)
Magnesium: 2.1 mg/dL (ref 1.5–2.5)
Magnesium: 2.2 mg/dL (ref 1.5–2.5)
Magnesium: 2.3 mg/dL (ref 1.5–2.5)
Magnesium: 2.6 mg/dL — ABNORMAL HIGH (ref 1.5–2.5)
Magnesium: 2.8 mg/dL — ABNORMAL HIGH (ref 1.5–2.5)

## 2011-01-11 LAB — COMPREHENSIVE METABOLIC PANEL
ALT: 12 U/L (ref 0–53)
ALT: 152 U/L — ABNORMAL HIGH (ref 0–53)
ALT: 28 U/L (ref 0–53)
ALT: 30 U/L (ref 0–53)
ALT: 33 U/L (ref 0–53)
AST: 24 U/L (ref 0–37)
AST: 53 U/L — ABNORMAL HIGH (ref 0–37)
AST: 64 U/L — ABNORMAL HIGH (ref 0–37)
AST: 85 U/L — ABNORMAL HIGH (ref 0–37)
Albumin: 1.7 g/dL — ABNORMAL LOW (ref 3.5–5.2)
Albumin: 1.7 g/dL — ABNORMAL LOW (ref 3.5–5.2)
Albumin: 1.7 g/dL — ABNORMAL LOW (ref 3.5–5.2)
Albumin: 2 g/dL — ABNORMAL LOW (ref 3.5–5.2)
Albumin: 2.3 g/dL — ABNORMAL LOW (ref 3.5–5.2)
Alkaline Phosphatase: 183 U/L — ABNORMAL HIGH (ref 39–117)
Alkaline Phosphatase: 201 U/L — ABNORMAL HIGH (ref 39–117)
Alkaline Phosphatase: 300 U/L — ABNORMAL HIGH (ref 39–117)
Alkaline Phosphatase: 54 U/L (ref 39–117)
Alkaline Phosphatase: 85 U/L (ref 39–117)
BUN: 32 mg/dL — ABNORMAL HIGH (ref 6–23)
BUN: 40 mg/dL — ABNORMAL HIGH (ref 6–23)
BUN: 65 mg/dL — ABNORMAL HIGH (ref 6–23)
BUN: 65 mg/dL — ABNORMAL HIGH (ref 6–23)
BUN: 94 mg/dL — ABNORMAL HIGH (ref 6–23)
CO2: 21 mEq/L (ref 19–32)
CO2: 24 mEq/L (ref 19–32)
CO2: 24 mEq/L (ref 19–32)
CO2: 25 mEq/L (ref 19–32)
Calcium: 8.2 mg/dL — ABNORMAL LOW (ref 8.4–10.5)
Calcium: 9.2 mg/dL (ref 8.4–10.5)
Chloride: 104 mEq/L (ref 96–112)
Chloride: 111 mEq/L (ref 96–112)
Chloride: 90 mEq/L — ABNORMAL LOW (ref 96–112)
Chloride: 90 mEq/L — ABNORMAL LOW (ref 96–112)
Chloride: 91 mEq/L — ABNORMAL LOW (ref 96–112)
Chloride: 94 mEq/L — ABNORMAL LOW (ref 96–112)
Chloride: 94 mEq/L — ABNORMAL LOW (ref 96–112)
Chloride: 98 mEq/L (ref 96–112)
Creatinine, Ser: 10.55 mg/dL — ABNORMAL HIGH (ref 0.4–1.5)
Creatinine, Ser: 5.2 mg/dL — ABNORMAL HIGH (ref 0.4–1.5)
Creatinine, Ser: 5.64 mg/dL — ABNORMAL HIGH (ref 0.4–1.5)
Creatinine, Ser: 5.83 mg/dL — ABNORMAL HIGH (ref 0.4–1.5)
Creatinine, Ser: 7.04 mg/dL — ABNORMAL HIGH (ref 0.4–1.5)
Creatinine, Ser: 9.91 mg/dL — ABNORMAL HIGH (ref 0.4–1.5)
Creatinine, Ser: 9.97 mg/dL — ABNORMAL HIGH (ref 0.4–1.5)
GFR calc Af Amer: 10 mL/min — ABNORMAL LOW (ref >60)
GFR calc Af Amer: 12 mL/min — ABNORMAL LOW (ref >60)
GFR calc Af Amer: 13 mL/min — ABNORMAL LOW (ref >60)
GFR calc Af Amer: 14 mL/min — ABNORMAL LOW (ref >60)
GFR calc Af Amer: 6 mL/min — ABNORMAL LOW (ref >60)
GFR calc non Af Amer: 10 mL/min — ABNORMAL LOW (ref >60)
GFR calc non Af Amer: 10 mL/min — ABNORMAL LOW (ref >60)
GFR calc non Af Amer: 11 mL/min — ABNORMAL LOW (ref >60)
GFR calc non Af Amer: 5 mL/min — ABNORMAL LOW (ref >60)
GFR calc non Af Amer: 8 mL/min — ABNORMAL LOW (ref >60)
Glucose, Bld: 119 mg/dL — ABNORMAL HIGH (ref 70–99)
Glucose, Bld: 138 mg/dL — ABNORMAL HIGH (ref 70–99)
Glucose, Bld: 138 mg/dL — ABNORMAL HIGH (ref 70–99)
Glucose, Bld: 153 mg/dL — ABNORMAL HIGH (ref 70–99)
Glucose, Bld: 154 mg/dL — ABNORMAL HIGH (ref 70–99)
Potassium: 3.5 mEq/L (ref 3.5–5.1)
Potassium: 4.5 mEq/L (ref 3.5–5.1)
Potassium: 4.7 mEq/L (ref 3.5–5.1)
Potassium: 4.7 mEq/L (ref 3.5–5.1)
Potassium: 6.1 mEq/L — ABNORMAL HIGH (ref 3.5–5.1)
Sodium: 130 mEq/L — ABNORMAL LOW (ref 135–145)
Sodium: 135 mEq/L (ref 135–145)
Sodium: 137 mEq/L (ref 135–145)
Sodium: 137 mEq/L (ref 135–145)
Sodium: 138 mEq/L (ref 135–145)
Total Bilirubin: 0.8 mg/dL (ref 0.3–1.2)
Total Bilirubin: 0.9 mg/dL (ref 0.3–1.2)
Total Bilirubin: 1.2 mg/dL (ref 0.3–1.2)
Total Bilirubin: 1.4 mg/dL — ABNORMAL HIGH (ref 0.3–1.2)
Total Bilirubin: 1.7 mg/dL — ABNORMAL HIGH (ref 0.3–1.2)
Total Bilirubin: 1.7 mg/dL — ABNORMAL HIGH (ref 0.3–1.2)
Total Bilirubin: 1.7 mg/dL — ABNORMAL HIGH (ref 0.3–1.2)
Total Bilirubin: 2.5 mg/dL — ABNORMAL HIGH (ref 0.3–1.2)
Total Protein: 5.4 g/dL — ABNORMAL LOW (ref 6.0–8.3)
Total Protein: 6.8 g/dL (ref 6.0–8.3)
Total Protein: 8.5 g/dL — ABNORMAL HIGH (ref 6.0–8.3)
Total Protein: 9.4 g/dL — ABNORMAL HIGH (ref 6.0–8.3)

## 2011-01-11 LAB — RENAL FUNCTION PANEL
Albumin: 1.8 g/dL — ABNORMAL LOW (ref 3.5–5.2)
Albumin: 2.1 g/dL — ABNORMAL LOW (ref 3.5–5.2)
Albumin: 2.4 g/dL — ABNORMAL LOW (ref 3.5–5.2)
BUN: 124 mg/dL — ABNORMAL HIGH (ref 6–23)
CO2: 18 mEq/L — ABNORMAL LOW (ref 19–32)
CO2: 22 mEq/L (ref 19–32)
Calcium: 7.8 mg/dL — ABNORMAL LOW (ref 8.4–10.5)
Calcium: 8.2 mg/dL — ABNORMAL LOW (ref 8.4–10.5)
Chloride: 93 mEq/L — ABNORMAL LOW (ref 96–112)
Chloride: 96 mEq/L (ref 96–112)
Creatinine, Ser: 6.69 mg/dL — ABNORMAL HIGH (ref 0.4–1.5)
Creatinine, Ser: 9.44 mg/dL — ABNORMAL HIGH (ref 0.4–1.5)
GFR calc Af Amer: 10 mL/min — ABNORMAL LOW (ref >60)
GFR calc Af Amer: 5 mL/min — ABNORMAL LOW (ref >60)
GFR calc non Af Amer: 4 mL/min — ABNORMAL LOW (ref >60)
GFR calc non Af Amer: 6 mL/min — ABNORMAL LOW (ref >60)
GFR calc non Af Amer: 9 mL/min — ABNORMAL LOW (ref >60)
Glucose, Bld: 138 mg/dL — ABNORMAL HIGH (ref 70–99)
Phosphorus: 15.3 mg/dL (ref 2.3–4.6)
Phosphorus: 4.4 mg/dL (ref 2.3–4.6)
Potassium: 5 mEq/L (ref 3.5–5.1)
Potassium: 5.6 mEq/L — ABNORMAL HIGH (ref 3.5–5.1)
Sodium: 133 mEq/L — ABNORMAL LOW (ref 135–145)

## 2011-01-11 LAB — CHOLESTEROL, TOTAL: Cholesterol: 118 mg/dL (ref 0–200)

## 2011-01-11 LAB — HEPATITIS PANEL, ACUTE
HCV Ab: NEGATIVE
Hep A IgM: NEGATIVE
Hep B C IgM: NEGATIVE
Hepatitis B Surface Ag: NEGATIVE

## 2011-01-11 LAB — DIFFERENTIAL
Basophils Absolute: 0 10*3/uL (ref 0.0–0.1)
Basophils Absolute: 0 10*3/uL (ref 0.0–0.1)
Basophils Absolute: 0 10*3/uL (ref 0.0–0.1)
Basophils Absolute: 0 10*3/uL (ref 0.0–0.1)
Basophils Absolute: 0 10*3/uL (ref 0.0–0.1)
Basophils Absolute: 0.2 10*3/uL — ABNORMAL HIGH (ref 0.0–0.1)
Basophils Relative: 0 % (ref 0–1)
Basophils Relative: 0 % (ref 0–1)
Basophils Relative: 0 % (ref 0–1)
Basophils Relative: 0 % (ref 0–1)
Basophils Relative: 0 % (ref 0–1)
Basophils Relative: 0 % (ref 0–1)
Basophils Relative: 0 % (ref 0–1)
Basophils Relative: 1 % (ref 0–1)
Eosinophils Absolute: 0.1 10*3/uL (ref 0.0–0.7)
Eosinophils Absolute: 0.1 10*3/uL (ref 0.0–0.7)
Eosinophils Absolute: 0.2 10*3/uL (ref 0.0–0.7)
Eosinophils Absolute: 0.3 10*3/uL (ref 0.0–0.7)
Eosinophils Absolute: 0.4 10*3/uL (ref 0.0–0.7)
Eosinophils Relative: 0 % (ref 0–5)
Eosinophils Relative: 0 % (ref 0–5)
Eosinophils Relative: 0 % (ref 0–5)
Eosinophils Relative: 1 % (ref 0–5)
Eosinophils Relative: 2 % (ref 0–5)
Lymphocytes Relative: 3 % — ABNORMAL LOW (ref 12–46)
Lymphocytes Relative: 5 % — ABNORMAL LOW (ref 12–46)
Lymphocytes Relative: 6 % — ABNORMAL LOW (ref 12–46)
Lymphs Abs: 0.9 10*3/uL (ref 0.7–4.0)
Lymphs Abs: 1.6 10*3/uL (ref 0.7–4.0)
Lymphs Abs: 1.7 10*3/uL (ref 0.7–4.0)
Lymphs Abs: 2.3 10*3/uL (ref 0.7–4.0)
Metamyelocytes Relative: 0 %
Monocytes Absolute: 1.3 10*3/uL — ABNORMAL HIGH (ref 0.1–1.0)
Monocytes Absolute: 1.5 10*3/uL — ABNORMAL HIGH (ref 0.1–1.0)
Monocytes Absolute: 1.6 10*3/uL — ABNORMAL HIGH (ref 0.1–1.0)
Monocytes Absolute: 1.7 10*3/uL — ABNORMAL HIGH (ref 0.1–1.0)
Monocytes Absolute: 2.4 10*3/uL — ABNORMAL HIGH (ref 0.1–1.0)
Monocytes Absolute: 2.9 10*3/uL — ABNORMAL HIGH (ref 0.1–1.0)
Monocytes Relative: 15 % — ABNORMAL HIGH (ref 3–12)
Monocytes Relative: 17 % — ABNORMAL HIGH (ref 3–12)
Monocytes Relative: 6 % (ref 3–12)
Monocytes Relative: 6 % (ref 3–12)
Monocytes Relative: 9 % (ref 3–12)
Monocytes Relative: 9 % (ref 3–12)
Neutro Abs: 14.6 10*3/uL — ABNORMAL HIGH (ref 1.7–7.7)
Neutro Abs: 14.9 10*3/uL — ABNORMAL HIGH (ref 1.7–7.7)
Neutro Abs: 15.1 10*3/uL — ABNORMAL HIGH (ref 1.7–7.7)
Neutro Abs: 23 10*3/uL — ABNORMAL HIGH (ref 1.7–7.7)
Neutro Abs: 23.8 10*3/uL — ABNORMAL HIGH (ref 1.7–7.7)
Neutro Abs: 35.5 10*3/uL — ABNORMAL HIGH (ref 1.7–7.7)
Neutro Abs: 42.7 10*3/uL — ABNORMAL HIGH (ref 1.7–7.7)
Neutro Abs: 7.8 10*3/uL — ABNORMAL HIGH (ref 1.7–7.7)
Neutrophils Relative %: 76 % (ref 43–77)
Neutrophils Relative %: 76 % (ref 43–77)
Neutrophils Relative %: 80 % — ABNORMAL HIGH (ref 43–77)
Neutrophils Relative %: 85 % — ABNORMAL HIGH (ref 43–77)
Neutrophils Relative %: 90 % — ABNORMAL HIGH (ref 43–77)
Smear Review: INCREASED
nRBC: 0 /100 WBC

## 2011-01-11 LAB — POCT I-STAT 3, ART BLOOD GAS (G3+)
Bicarbonate: 24.2 mEq/L — ABNORMAL HIGH (ref 20.0–24.0)
Patient temperature: 97.9
TCO2: 26 mmol/L (ref 0–100)
pCO2 arterial: 39.5 mmHg (ref 35.0–45.0)
pCO2 arterial: 42.5 mmHg (ref 35.0–45.0)
pH, Arterial: 7.326 — ABNORMAL LOW (ref 7.350–7.450)
pH, Arterial: 7.364 (ref 7.350–7.450)

## 2011-01-11 LAB — URINE MICROSCOPIC-ADD ON

## 2011-01-11 LAB — CLOSTRIDIUM DIFFICILE EIA

## 2011-01-11 LAB — BASIC METABOLIC PANEL WITH GFR
CO2: 24 meq/L (ref 19–32)
Calcium: 8.7 mg/dL (ref 8.4–10.5)
Glucose, Bld: 124 mg/dL — ABNORMAL HIGH (ref 70–99)
Potassium: 5.1 meq/L (ref 3.5–5.1)
Sodium: 139 meq/L (ref 135–145)

## 2011-01-11 LAB — URINE CULTURE
Colony Count: NO GROWTH
Culture: NO GROWTH

## 2011-01-11 LAB — COMPREHENSIVE METABOLIC PANEL WITH GFR
ALT: 27 U/L (ref 0–53)
Albumin: 1.4 g/dL — CL (ref 3.5–5.2)
Alkaline Phosphatase: 73 U/L (ref 39–117)
BUN: 44 mg/dL — ABNORMAL HIGH (ref 6–23)
Calcium: 8 mg/dL — ABNORMAL LOW (ref 8.4–10.5)
Potassium: 3.4 meq/L — ABNORMAL LOW (ref 3.5–5.1)
Sodium: 138 meq/L (ref 135–145)
Total Protein: 5.5 g/dL — ABNORMAL LOW (ref 6.0–8.3)

## 2011-01-11 LAB — PHOSPHORUS
Phosphorus: 2.6 mg/dL (ref 2.3–4.6)
Phosphorus: 2.7 mg/dL (ref 2.3–4.6)
Phosphorus: 3.1 mg/dL (ref 2.3–4.6)
Phosphorus: 3.9 mg/dL (ref 2.3–4.6)
Phosphorus: 3.9 mg/dL (ref 2.3–4.6)
Phosphorus: 4.2 mg/dL (ref 2.3–4.6)
Phosphorus: 4.8 mg/dL — ABNORMAL HIGH (ref 2.3–4.6)
Phosphorus: 5.1 mg/dL — ABNORMAL HIGH (ref 2.3–4.6)
Phosphorus: 5.3 mg/dL — ABNORMAL HIGH (ref 2.3–4.6)
Phosphorus: 5.4 mg/dL — ABNORMAL HIGH (ref 2.3–4.6)
Phosphorus: 5.5 mg/dL — ABNORMAL HIGH (ref 2.3–4.6)
Phosphorus: 5.6 mg/dL — ABNORMAL HIGH (ref 2.3–4.6)
Phosphorus: 6.2 mg/dL — ABNORMAL HIGH (ref 2.3–4.6)
Phosphorus: 6.9 mg/dL — ABNORMAL HIGH (ref 2.3–4.6)
Phosphorus: 7.2 mg/dL — ABNORMAL HIGH (ref 2.3–4.6)

## 2011-01-11 LAB — URINALYSIS, MICROSCOPIC ONLY
Nitrite: NEGATIVE
Protein, ur: >300 mg/dL — AB
Specific Gravity, Urine: 1.028 (ref 1.005–1.030)
Urobilinogen, UA: 1 mg/dL (ref 0.0–1.0)

## 2011-01-11 LAB — CULTURE, RESPIRATORY W GRAM STAIN

## 2011-01-11 LAB — TRIGLYCERIDES: Triglycerides: 250 mg/dL — ABNORMAL HIGH (ref <150)

## 2011-01-11 LAB — PREALBUMIN
Prealbumin: 11 mg/dL — ABNORMAL LOW (ref 18.0–45.0)
Prealbumin: 5.6 mg/dL — ABNORMAL LOW (ref 18.0–45.0)

## 2011-01-11 LAB — URINALYSIS, ROUTINE W REFLEX MICROSCOPIC
Nitrite: POSITIVE — AB
Protein, ur: >300 mg/dL — AB
Specific Gravity, Urine: 1.035 — ABNORMAL HIGH (ref 1.005–1.030)
Urobilinogen, UA: 1 mg/dL (ref 0.0–1.0)

## 2011-01-11 LAB — HEPATIC FUNCTION PANEL
ALT: 34 U/L (ref 0–53)
AST: 56 U/L — ABNORMAL HIGH (ref 0–37)
Albumin: 1.6 g/dL — ABNORMAL LOW (ref 3.5–5.2)
Bilirubin, Direct: 0.9 mg/dL — ABNORMAL HIGH (ref 0.0–0.3)
Total Bilirubin: 1.6 mg/dL — ABNORMAL HIGH (ref 0.3–1.2)

## 2011-01-11 LAB — PROTIME-INR
INR: 1.05 (ref 0.00–1.49)
INR: 1.07 (ref 0.00–1.49)
INR: 1.27 (ref 0.00–1.49)
Prothrombin Time: 13.8 seconds (ref 11.6–15.2)
Prothrombin Time: 15.8 seconds — ABNORMAL HIGH (ref 11.6–15.2)

## 2011-01-11 LAB — POCT I-STAT 4, (NA,K, GLUC, HGB,HCT)
HCT: 33 % — ABNORMAL LOW (ref 39.0–52.0)
Hemoglobin: 11.2 g/dL — ABNORMAL LOW (ref 13.0–17.0)
Potassium: 4.1 mEq/L (ref 3.5–5.1)
Sodium: 141 mEq/L (ref 135–145)

## 2011-01-11 LAB — APTT: aPTT: 40 seconds — ABNORMAL HIGH (ref 24–37)

## 2011-01-11 LAB — BODY FLUID CULTURE

## 2011-01-11 LAB — PROTEIN ELECTROPHORESIS, SERUM
Alpha-2-Globulin: 20.2 % — ABNORMAL HIGH (ref 7.1–11.8)
Beta 2: 7 % — ABNORMAL HIGH (ref 3.2–6.5)
Beta Globulin: 4.2 % — ABNORMAL LOW (ref 4.7–7.2)
M-Spike, %: NOT DETECTED g/dL

## 2011-01-11 LAB — ANAEROBIC CULTURE

## 2011-01-11 LAB — HEPATITIS B SURFACE ANTIBODY,QUALITATIVE: Hep B S Ab: NEGATIVE

## 2011-01-11 LAB — HEMOGLOBIN A1C
Hgb A1c MFr Bld: 4.5 % — ABNORMAL LOW (ref 4.6–6.1)
Mean Plasma Glucose: 82 mg/dL

## 2011-01-11 LAB — HEPATITIS B SURFACE ANTIGEN: Hepatitis B Surface Ag: NEGATIVE

## 2011-01-11 LAB — CULTURE, BAL-QUANTITATIVE W GRAM STAIN

## 2011-01-11 LAB — FERRITIN: Ferritin: 1871 ng/mL — ABNORMAL HIGH (ref 22–322)

## 2011-01-11 LAB — CREATININE, URINE, RANDOM: Creatinine, Urine: 139.7 mg/dL

## 2011-01-11 LAB — FOLATE: Folate: 10.3 ng/mL

## 2011-01-11 LAB — POTASSIUM: Potassium: 3.6 mEq/L (ref 3.5–5.1)

## 2011-01-11 LAB — PTH, INTACT AND CALCIUM: Calcium, Total (PTH): 8.3 mg/dL — ABNORMAL LOW (ref 8.4–10.5)

## 2011-01-15 LAB — COMPREHENSIVE METABOLIC PANEL
AST: 29 U/L (ref 0–37)
AST: 33 U/L (ref 0–37)
Albumin: 1.8 g/dL — ABNORMAL LOW (ref 3.5–5.2)
Albumin: 1.8 g/dL — ABNORMAL LOW (ref 3.5–5.2)
Alkaline Phosphatase: 161 U/L — ABNORMAL HIGH (ref 39–117)
BUN: 17 mg/dL (ref 6–23)
BUN: 32 mg/dL — ABNORMAL HIGH (ref 6–23)
BUN: 37 mg/dL — ABNORMAL HIGH (ref 6–23)
CO2: 27 mEq/L (ref 19–32)
Chloride: 91 mEq/L — ABNORMAL LOW (ref 96–112)
Chloride: 94 mEq/L — ABNORMAL LOW (ref 96–112)
Creatinine, Ser: 5.5 mg/dL — ABNORMAL HIGH (ref 0.4–1.5)
Creatinine, Ser: 9.03 mg/dL — ABNORMAL HIGH (ref 0.4–1.5)
GFR calc Af Amer: 10 mL/min — ABNORMAL LOW (ref >60)
GFR calc Af Amer: 7 mL/min — ABNORMAL LOW (ref >60)
GFR calc non Af Amer: 6 mL/min — ABNORMAL LOW (ref >60)
Glucose, Bld: 93 mg/dL (ref 70–99)
Potassium: 5.3 mEq/L — ABNORMAL HIGH (ref 3.5–5.1)
Total Bilirubin: 0.6 mg/dL (ref 0.3–1.2)
Total Bilirubin: 0.8 mg/dL (ref 0.3–1.2)
Total Bilirubin: 0.9 mg/dL (ref 0.3–1.2)
Total Protein: 5.1 g/dL — ABNORMAL LOW (ref 6.0–8.3)

## 2011-01-15 LAB — CROSSMATCH

## 2011-01-15 LAB — BASIC METABOLIC PANEL
CO2: 27 mEq/L (ref 19–32)
CO2: 27 mEq/L (ref 19–32)
Calcium: 8.3 mg/dL — ABNORMAL LOW (ref 8.4–10.5)
Chloride: 93 mEq/L — ABNORMAL LOW (ref 96–112)
Creatinine, Ser: 8.22 mg/dL — ABNORMAL HIGH (ref 0.4–1.5)
GFR calc non Af Amer: 11 mL/min — ABNORMAL LOW (ref >60)
Glucose, Bld: 141 mg/dL — ABNORMAL HIGH (ref 70–99)
Glucose, Bld: 92 mg/dL (ref 70–99)
Potassium: 2.9 mEq/L — ABNORMAL LOW (ref 3.5–5.1)
Potassium: 3.5 mEq/L (ref 3.5–5.1)
Sodium: 133 mEq/L — ABNORMAL LOW (ref 135–145)
Sodium: 136 mEq/L (ref 135–145)

## 2011-01-15 LAB — CBC
HCT: 21.8 % — ABNORMAL LOW (ref 39.0–52.0)
HCT: 30.3 % — ABNORMAL LOW (ref 39.0–52.0)
HCT: 31.4 % — ABNORMAL LOW (ref 39.0–52.0)
HCT: 31.7 % — ABNORMAL LOW (ref 39.0–52.0)
Hemoglobin: 10.4 g/dL — ABNORMAL LOW (ref 13.0–17.0)
Hemoglobin: 10.8 g/dL — ABNORMAL LOW (ref 13.0–17.0)
Hemoglobin: 10.8 g/dL — ABNORMAL LOW (ref 13.0–17.0)
Hemoglobin: 9.3 g/dL — ABNORMAL LOW (ref 13.0–17.0)
Hemoglobin: 9.6 g/dL — ABNORMAL LOW (ref 13.0–17.0)
MCHC: 33.3 g/dL (ref 30.0–36.0)
MCHC: 34.3 g/dL (ref 30.0–36.0)
MCHC: 34.8 g/dL (ref 30.0–36.0)
MCV: 91.2 fL (ref 78.0–100.0)
MCV: 91.3 fL (ref 78.0–100.0)
MCV: 91.3 fL (ref 78.0–100.0)
Platelets: 377 10*3/uL (ref 150–400)
Platelets: 580 10*3/uL — ABNORMAL HIGH (ref 150–400)
RBC: 2.92 MIL/uL — ABNORMAL LOW (ref 4.22–5.81)
RBC: 3.06 MIL/uL — ABNORMAL LOW (ref 4.22–5.81)
RBC: 3.32 MIL/uL — ABNORMAL LOW (ref 4.22–5.81)
RBC: 3.49 MIL/uL — ABNORMAL LOW (ref 4.22–5.81)
RDW: 15.9 % — ABNORMAL HIGH (ref 11.5–15.5)
RDW: 16.3 % — ABNORMAL HIGH (ref 11.5–15.5)
WBC: 11.5 10*3/uL — ABNORMAL HIGH (ref 4.0–10.5)
WBC: 17.4 10*3/uL — ABNORMAL HIGH (ref 4.0–10.5)
WBC: 9.8 10*3/uL (ref 4.0–10.5)

## 2011-01-15 LAB — PHOSPHORUS
Phosphorus: 4.4 mg/dL (ref 2.3–4.6)
Phosphorus: 4.8 mg/dL — ABNORMAL HIGH (ref 2.3–4.6)
Phosphorus: 6.3 mg/dL — ABNORMAL HIGH (ref 2.3–4.6)

## 2011-01-15 LAB — DIFFERENTIAL
Basophils Absolute: 0 10*3/uL (ref 0.0–0.1)
Basophils Absolute: 0.1 10*3/uL (ref 0.0–0.1)
Basophils Relative: 0 % (ref 0–1)
Basophils Relative: 0 % (ref 0–1)
Eosinophils Absolute: 0.1 10*3/uL (ref 0.0–0.7)
Eosinophils Relative: 1 % (ref 0–5)
Eosinophils Relative: 1 % (ref 0–5)
Lymphocytes Relative: 15 % (ref 12–46)
Lymphs Abs: 1.2 10*3/uL (ref 0.7–4.0)
Lymphs Abs: 1.3 10*3/uL (ref 0.7–4.0)
Monocytes Absolute: 1.3 10*3/uL — ABNORMAL HIGH (ref 0.1–1.0)
Monocytes Absolute: 1.5 10*3/uL — ABNORMAL HIGH (ref 0.1–1.0)
Monocytes Relative: 10 % (ref 3–12)
Monocytes Relative: 11 % (ref 3–12)
Neutro Abs: 7.3 10*3/uL (ref 1.7–7.7)
Neutro Abs: 8.6 10*3/uL — ABNORMAL HIGH (ref 1.7–7.7)
Neutrophils Relative %: 75 % (ref 43–77)
Neutrophils Relative %: 75 % (ref 43–77)

## 2011-01-15 LAB — AMYLASE: Amylase: 235 U/L — ABNORMAL HIGH (ref 0–105)

## 2011-01-15 LAB — LIPASE, BLOOD: Lipase: 155 U/L — ABNORMAL HIGH (ref 11–59)

## 2011-01-15 LAB — PROTIME-INR: Prothrombin Time: 15.8 seconds — ABNORMAL HIGH (ref 11.6–15.2)

## 2015-12-19 DIAGNOSIS — N183 Chronic kidney disease, stage 3 unspecified: Secondary | ICD-10-CM | POA: Insufficient documentation

## 2015-12-23 ENCOUNTER — Encounter: Payer: Self-pay | Admitting: Gastroenterology

## 2016-01-18 ENCOUNTER — Encounter: Payer: Self-pay | Admitting: Gastroenterology

## 2016-01-18 ENCOUNTER — Telehealth: Payer: Self-pay | Admitting: *Deleted

## 2016-01-18 NOTE — Telephone Encounter (Signed)
Please schedule him for office visit first at next available appt, ok to schedule with AP.

## 2016-01-18 NOTE — Telephone Encounter (Signed)
Dr. Silverio Decamp,  This patient is scheduled for a colonoscopy with you Tuesday 02/07/16. He is referred to [place] by Dr. Coletta Memos. Notes that were faxed to [place] but not yet scanned into the computer state: [id number] he had a complicated abdominal surgery which ended up with aspiration pneumonia, hypertension, tracheotomy and dialysis due to acute renal failure. Hospitalized 47 days. No other information. Do you want to proceed with Direct here at [facility] or would you like an office visit with him first. Additional notes regarding surgery?   Please advise. Thank you, Olivia Mackie

## 2016-01-18 NOTE — Telephone Encounter (Signed)
Phoned pt to schedule OV, pt denies any current problems and desires to discuss below with MD. OV scheduled 04/04/16. Records to be scanned from Dr. Coletta Memos. Encouraged patient to acquire and hospitalization an operative notes from 2011 and bring to appt with Dr. Silverio Decamp.

## 2016-02-07 ENCOUNTER — Encounter: Payer: Self-pay | Admitting: Gastroenterology

## 2016-04-04 ENCOUNTER — Ambulatory Visit: Payer: Self-pay | Admitting: Gastroenterology

## 2016-05-25 ENCOUNTER — Encounter (HOSPITAL_COMMUNITY): Payer: Self-pay | Admitting: Emergency Medicine

## 2016-05-25 ENCOUNTER — Emergency Department (HOSPITAL_COMMUNITY)
Admission: EM | Admit: 2016-05-25 | Discharge: 2016-05-25 | Disposition: A | Discharge status: Home / Self Care | Payer: Managed Care, Other (non HMO) | Attending: Emergency Medicine | Admitting: Emergency Medicine

## 2016-05-25 DIAGNOSIS — I10 Essential (primary) hypertension: Secondary | ICD-10-CM | POA: Diagnosis not present

## 2016-05-25 DIAGNOSIS — T783XXA Angioneurotic edema, initial encounter: Secondary | ICD-10-CM | POA: Diagnosis present

## 2016-05-25 HISTORY — DX: Essential (primary) hypertension: I10

## 2016-05-25 MED ORDER — PREDNISONE 20 MG PO TABS: 60.0000 mg | ORAL_TABLET | Freq: Every day | ORAL | 0 refills | Status: AC

## 2016-05-25 MED ORDER — PREDNISONE 20 MG PO TABS
60.0000 mg | ORAL_TABLET | Freq: Once | ORAL | Status: AC
  Administered 2016-05-25: 60 mg via ORAL
  Filled 2016-05-25: qty 3

## 2016-05-25 NOTE — ED Notes (Signed)
Upper and lower lip swollen. So swelling to tongue or throat.

## 2016-05-25 NOTE — ED Provider Notes (Signed)
[facility] DEPT Provider Note   CSN: [place]:[id number] Arrival date & time: 05/25/16  0114  First Provider Contact:  First MD Initiated Contact with Patient 05/25/16 0505        History   Chief Complaint Chief Complaint  Patient presents with  . Angioedema    HPI Jesus Ayala is a 65 y.o. male.  Patient presents with swelling of his upper lower lip which began yesterday afternoon.  He states that the swelling of his upper and lower lip began abruptly and since then has improved.  He states the same thing happened 6 months ago he went to an urgent care and no diagnosis was given and no changes to his medications occurred.  He is on enalapril.  No other complaints.  No shortness of breath.  Denies significant tongue swelling.  No shortness of breath.  Symptoms are improving and mild in severity   The history is provided by the patient.    Past Medical History:  Diagnosis Date  . Hypertension     There are no active problems to display for this patient.   History reviewed. No pertinent surgical history.     Home Medications    . amLODIPine (NORVASC) 10 MG tablet Take 1 tablet by mouth daily.  [name] BYSTOLIC 20 mg Tab Take 1 tablet by mouth daily.  . enalapril (VASOTEC) 10 MG tablet Take 1 tablet by mouth daily.  . furosemide (LASIX) 80 MG tablet Take 1.5 tablets by mouth daily.  [name] loratadine (CLARITIN) 10 mg tablet Take 1 tablet (10 mg total) by mouth daily. 30 tablet 11  . latanoprost (XALATAN) 0.005 % ophthalmic solution Administer 1 drop to both eyes daily. 1  . methocarbamol (ROBAXIN) 500 MG tablet Take 1 tablet by mouth three (3) times a day (at 6am, noon and 6pm). 0    Family History No family history on file.  Social History Social History  Substance Use Topics  . Smoking status: Never Smoker  . Smokeless tobacco: Not on file  . Alcohol use Yes     Allergies   Review of patient's allergies indicates no known allergies.   Review of Systems Review of  Systems  All other systems reviewed and are negative.    Physical Exam Updated Vital Signs BP 181/93 (BP Location: Left Arm)   Pulse 108   Temp 98.7 F (37.1 C) (Oral)   Resp 16   SpO2 100%   Physical Exam  Constitutional: He is oriented to person, place, and time. He appears well-developed and well-nourished.  HENT:  Swelling of both upper and lower lip.  Very mild swelling of the tongue.  Uvula is midline.  Oral airway patent.  Tolerating secretions.  Speech is normal.  Eyes: EOM are normal.  Neck: Normal range of motion.  Pulmonary/Chest: Effort normal.  Abdominal: He exhibits no distension.  Musculoskeletal: Normal range of motion.  Neurological: He is alert and oriented to person, place, and time.  Psychiatric: He has a normal mood and affect.  Nursing note and vitals reviewed.    ED Treatments / Results  Labs (all labs ordered are listed, but only abnormal results are displayed) Labs Reviewed - No data to display  EKG  EKG Interpretation None       Radiology No results found.  Procedures Procedures (including critical care time)  Medications Ordered in ED Medications - No data to display   Initial Impression / Assessment and Plan / ED Course  I have reviewed the triage  vital signs and the nursing notes.  Pertinent labs & imaging results that were available during my care of the patient were reviewed by me and considered in my medical decision making (see chart for details).  Clinical Course    Patient angioedema secondary to enalapril.  Patient told to discontinue enalapril.  He will contact his primary care physician for additional recommendations for blood pressure medications.  Home with 2 additional days of steroids.  Enalapril added to his allergy list.  Final Clinical Impressions(s) / ED Diagnoses   Final diagnoses:  Angioedema, initial encounter    New Prescriptions New Prescriptions   PREDNISONE (DELTASONE) 20 MG TABLET    Take 3  tablets (60 mg total) by mouth daily.     Jola Schmidt, MD 05/25/16 [phone]

## 2016-05-25 NOTE — ED Triage Notes (Signed)
Pt. reports upper/lower lip swelling onset yesterday afternoon , denies SOB , airway intact , denies injury .

## 2016-07-12 DIAGNOSIS — H6981 Other specified disorders of Eustachian tube, right ear: Secondary | ICD-10-CM | POA: Insufficient documentation

## 2016-07-12 DIAGNOSIS — H6991 Unspecified Eustachian tube disorder, right ear: Secondary | ICD-10-CM | POA: Insufficient documentation

## 2017-01-03 DIAGNOSIS — Z0000 Encounter for general adult medical examination without abnormal findings: Secondary | ICD-10-CM | POA: Insufficient documentation

## 2017-07-19 DIAGNOSIS — Z23 Encounter for immunization: Secondary | ICD-10-CM | POA: Diagnosis not present

## 2017-07-19 DIAGNOSIS — Z0000 Encounter for general adult medical examination without abnormal findings: Secondary | ICD-10-CM | POA: Diagnosis not present

## 2017-09-11 DIAGNOSIS — S46911A Strain of unspecified muscle, fascia and tendon at shoulder and upper arm level, right arm, initial encounter: Secondary | ICD-10-CM | POA: Diagnosis not present

## 2018-01-02 DIAGNOSIS — M542 Cervicalgia: Secondary | ICD-10-CM | POA: Diagnosis not present

## 2018-01-14 DIAGNOSIS — H2513 Age-related nuclear cataract, bilateral: Secondary | ICD-10-CM | POA: Diagnosis not present

## 2018-01-14 DIAGNOSIS — H463 Toxic optic neuropathy: Secondary | ICD-10-CM | POA: Diagnosis not present

## 2018-02-26 DIAGNOSIS — N183 Chronic kidney disease, stage 3 (moderate): Secondary | ICD-10-CM | POA: Diagnosis not present

## 2018-02-26 DIAGNOSIS — I129 Hypertensive chronic kidney disease with stage 1 through stage 4 chronic kidney disease, or unspecified chronic kidney disease: Secondary | ICD-10-CM | POA: Diagnosis not present

## 2018-02-26 DIAGNOSIS — T464X5A Adverse effect of angiotensin-converting-enzyme inhibitors, initial encounter: Secondary | ICD-10-CM | POA: Diagnosis not present

## 2018-02-26 DIAGNOSIS — T783XXA Angioneurotic edema, initial encounter: Secondary | ICD-10-CM | POA: Diagnosis not present

## 2018-03-13 DIAGNOSIS — N183 Chronic kidney disease, stage 3 (moderate): Secondary | ICD-10-CM | POA: Diagnosis not present

## 2018-09-11 DIAGNOSIS — Z23 Encounter for immunization: Secondary | ICD-10-CM | POA: Diagnosis not present

## 2018-12-15 DIAGNOSIS — M1811 Unilateral primary osteoarthritis of first carpometacarpal joint, right hand: Secondary | ICD-10-CM | POA: Insufficient documentation

## 2018-12-15 DIAGNOSIS — M79641 Pain in right hand: Secondary | ICD-10-CM | POA: Diagnosis not present

## 2018-12-15 DIAGNOSIS — R609 Edema, unspecified: Secondary | ICD-10-CM | POA: Diagnosis not present

## 2018-12-26 DIAGNOSIS — R609 Edema, unspecified: Secondary | ICD-10-CM | POA: Diagnosis not present

## 2018-12-26 DIAGNOSIS — M1811 Unilateral primary osteoarthritis of first carpometacarpal joint, right hand: Secondary | ICD-10-CM | POA: Diagnosis not present

## 2018-12-26 DIAGNOSIS — M79641 Pain in right hand: Secondary | ICD-10-CM | POA: Diagnosis not present

## 2019-01-07 DIAGNOSIS — M1A0410 Idiopathic chronic gout, right hand, without tophus (tophi): Secondary | ICD-10-CM | POA: Diagnosis not present

## 2019-03-27 DIAGNOSIS — N183 Chronic kidney disease, stage 3 (moderate): Secondary | ICD-10-CM | POA: Diagnosis not present

## 2019-03-27 DIAGNOSIS — M5489 Other dorsalgia: Secondary | ICD-10-CM | POA: Diagnosis not present

## 2019-03-27 DIAGNOSIS — R079 Chest pain, unspecified: Secondary | ICD-10-CM | POA: Diagnosis not present

## 2019-03-27 DIAGNOSIS — E7800 Pure hypercholesterolemia, unspecified: Secondary | ICD-10-CM | POA: Diagnosis not present

## 2019-03-27 DIAGNOSIS — R0789 Other chest pain: Secondary | ICD-10-CM | POA: Diagnosis not present

## 2019-03-27 DIAGNOSIS — Z87891 Personal history of nicotine dependence: Secondary | ICD-10-CM | POA: Diagnosis not present

## 2019-03-27 DIAGNOSIS — R0600 Dyspnea, unspecified: Secondary | ICD-10-CM | POA: Diagnosis not present

## 2019-03-27 DIAGNOSIS — I129 Hypertensive chronic kidney disease with stage 1 through stage 4 chronic kidney disease, or unspecified chronic kidney disease: Secondary | ICD-10-CM | POA: Diagnosis not present

## 2019-04-02 DIAGNOSIS — I129 Hypertensive chronic kidney disease with stage 1 through stage 4 chronic kidney disease, or unspecified chronic kidney disease: Secondary | ICD-10-CM | POA: Diagnosis not present

## 2019-04-02 DIAGNOSIS — E7800 Pure hypercholesterolemia, unspecified: Secondary | ICD-10-CM | POA: Diagnosis not present

## 2019-04-02 DIAGNOSIS — R079 Chest pain, unspecified: Secondary | ICD-10-CM | POA: Diagnosis not present

## 2019-04-02 DIAGNOSIS — N189 Chronic kidney disease, unspecified: Secondary | ICD-10-CM | POA: Diagnosis not present

## 2019-04-08 DIAGNOSIS — R079 Chest pain, unspecified: Secondary | ICD-10-CM | POA: Diagnosis not present

## 2019-04-18 DIAGNOSIS — R000 Tachycardia, unspecified: Secondary | ICD-10-CM | POA: Diagnosis not present

## 2019-04-18 DIAGNOSIS — I10 Essential (primary) hypertension: Secondary | ICD-10-CM | POA: Diagnosis not present

## 2019-04-18 DIAGNOSIS — R079 Chest pain, unspecified: Secondary | ICD-10-CM | POA: Diagnosis not present

## 2019-04-18 DIAGNOSIS — R0600 Dyspnea, unspecified: Secondary | ICD-10-CM | POA: Diagnosis not present

## 2019-04-19 ENCOUNTER — Encounter (HOSPITAL_COMMUNITY): Payer: Self-pay | Admitting: Internal Medicine

## 2019-04-19 ENCOUNTER — Observation Stay (HOSPITAL_COMMUNITY): Payer: Medicare Other

## 2019-04-19 ENCOUNTER — Inpatient Hospital Stay (HOSPITAL_COMMUNITY)
Admission: EM | Admit: 2019-04-19 | Discharge: 2019-04-22 | DRG: 683 | Disposition: A | Discharge status: Home / Self Care | Payer: Medicare Other | Attending: Internal Medicine | Admitting: Internal Medicine

## 2019-04-19 ENCOUNTER — Other Ambulatory Visit (HOSPITAL_COMMUNITY): Payer: Medicare Other

## 2019-04-19 ENCOUNTER — Other Ambulatory Visit: Payer: Self-pay

## 2019-04-19 ENCOUNTER — Emergency Department (HOSPITAL_COMMUNITY): Payer: Medicare Other

## 2019-04-19 DIAGNOSIS — D631 Anemia in chronic kidney disease: Secondary | ICD-10-CM | POA: Diagnosis present

## 2019-04-19 DIAGNOSIS — N184 Chronic kidney disease, stage 4 (severe): Secondary | ICD-10-CM | POA: Diagnosis not present

## 2019-04-19 DIAGNOSIS — Z87891 Personal history of nicotine dependence: Secondary | ICD-10-CM

## 2019-04-19 DIAGNOSIS — Z8249 Family history of ischemic heart disease and other diseases of the circulatory system: Secondary | ICD-10-CM

## 2019-04-19 DIAGNOSIS — I10 Essential (primary) hypertension: Secondary | ICD-10-CM | POA: Diagnosis not present

## 2019-04-19 DIAGNOSIS — N183 Chronic kidney disease, stage 3 unspecified: Secondary | ICD-10-CM | POA: Diagnosis present

## 2019-04-19 DIAGNOSIS — I200 Unstable angina: Secondary | ICD-10-CM | POA: Diagnosis present

## 2019-04-19 DIAGNOSIS — R079 Chest pain, unspecified: Secondary | ICD-10-CM | POA: Diagnosis present

## 2019-04-19 DIAGNOSIS — E872 Acidosis: Secondary | ICD-10-CM | POA: Diagnosis not present

## 2019-04-19 DIAGNOSIS — E785 Hyperlipidemia, unspecified: Secondary | ICD-10-CM | POA: Diagnosis not present

## 2019-04-19 DIAGNOSIS — D649 Anemia, unspecified: Secondary | ICD-10-CM | POA: Diagnosis not present

## 2019-04-19 DIAGNOSIS — R0602 Shortness of breath: Secondary | ICD-10-CM | POA: Diagnosis not present

## 2019-04-19 DIAGNOSIS — E8889 Other specified metabolic disorders: Secondary | ICD-10-CM | POA: Diagnosis present

## 2019-04-19 DIAGNOSIS — N179 Acute kidney failure, unspecified: Principal | ICD-10-CM | POA: Diagnosis present

## 2019-04-19 DIAGNOSIS — Z79899 Other long term (current) drug therapy: Secondary | ICD-10-CM

## 2019-04-19 DIAGNOSIS — Z888 Allergy status to other drugs, medicaments and biological substances status: Secondary | ICD-10-CM

## 2019-04-19 DIAGNOSIS — E876 Hypokalemia: Secondary | ICD-10-CM | POA: Diagnosis not present

## 2019-04-19 DIAGNOSIS — Z1159 Encounter for screening for other viral diseases: Secondary | ICD-10-CM

## 2019-04-19 DIAGNOSIS — R0789 Other chest pain: Secondary | ICD-10-CM | POA: Diagnosis not present

## 2019-04-19 DIAGNOSIS — Z7982 Long term (current) use of aspirin: Secondary | ICD-10-CM

## 2019-04-19 DIAGNOSIS — R600 Localized edema: Secondary | ICD-10-CM | POA: Diagnosis present

## 2019-04-19 DIAGNOSIS — Z801 Family history of malignant neoplasm of trachea, bronchus and lung: Secondary | ICD-10-CM

## 2019-04-19 DIAGNOSIS — I129 Hypertensive chronic kidney disease with stage 1 through stage 4 chronic kidney disease, or unspecified chronic kidney disease: Secondary | ICD-10-CM | POA: Diagnosis not present

## 2019-04-19 DIAGNOSIS — R809 Proteinuria, unspecified: Secondary | ICD-10-CM | POA: Diagnosis present

## 2019-04-19 HISTORY — DX: Hyperlipidemia, unspecified: E78.5

## 2019-04-19 LAB — IRON AND TIBC
Iron: 175 ug/dL (ref 45–182)
Saturation Ratios: 68 % — ABNORMAL HIGH (ref 17.9–39.5)
TIBC: 258 ug/dL (ref 250–450)
UIBC: 83 ug/dL

## 2019-04-19 LAB — BASIC METABOLIC PANEL
Anion gap: 17 — ABNORMAL HIGH (ref 5–15)
Anion gap: 11 (ref 5–15)
BUN: 92 mg/dL — ABNORMAL HIGH (ref 8–23)
BUN: 91 mg/dL — ABNORMAL HIGH (ref 8–23)
CO2: 14 mmol/L — ABNORMAL LOW (ref 22–32)
CO2: 18 mmol/L — ABNORMAL LOW (ref 22–32)
Calcium: 8.2 mg/dL — ABNORMAL LOW (ref 8.9–10.3)
Calcium: 8.4 mg/dL — ABNORMAL LOW (ref 8.9–10.3)
Chloride: 103 mmol/L (ref 98–111)
Chloride: 107 mmol/L (ref 98–111)
Creatinine, Ser: 9.72 mg/dL — ABNORMAL HIGH (ref 0.61–1.24)
Creatinine, Ser: 9.55 mg/dL — ABNORMAL HIGH (ref 0.61–1.24)
GFR calc Af Amer: 6 mL/min — ABNORMAL LOW (ref >60)
GFR calc Af Amer: 6 mL/min — ABNORMAL LOW (ref >60)
GFR calc non Af Amer: 5 mL/min — ABNORMAL LOW (ref >60)
GFR calc non Af Amer: 5 mL/min — ABNORMAL LOW (ref >60)
Glucose, Bld: 120 mg/dL — ABNORMAL HIGH (ref 70–99)
Glucose, Bld: 131 mg/dL — ABNORMAL HIGH (ref 70–99)
Potassium: 3.4 mmol/L — ABNORMAL LOW (ref 3.5–5.1)
Potassium: 3.6 mmol/L (ref 3.5–5.1)
Sodium: 134 mmol/L — ABNORMAL LOW (ref 135–145)
Sodium: 136 mmol/L (ref 135–145)

## 2019-04-19 LAB — LIPID PANEL
Cholesterol: 89 mg/dL (ref 0–200)
HDL: 47 mg/dL (ref >40)
LDL Cholesterol: 35 mg/dL (ref 0–99)
Total CHOL/HDL Ratio: 1.9 RATIO
Triglycerides: 36 mg/dL (ref <150)
VLDL: 7 mg/dL (ref 0–40)

## 2019-04-19 LAB — CBC
HCT: 18.6 % — ABNORMAL LOW (ref 39.0–52.0)
Hemoglobin: 6 g/dL — CL (ref 13.0–17.0)
MCH: 28.8 pg (ref 26.0–34.0)
MCHC: 32.3 g/dL (ref 30.0–36.0)
MCV: 89.4 fL (ref 80.0–100.0)
Platelets: 206 10*3/uL (ref 150–400)
RBC: 2.08 MIL/uL — ABNORMAL LOW (ref 4.22–5.81)
RDW: 12.7 % (ref 11.5–15.5)
WBC: 6.4 10*3/uL (ref 4.0–10.5)
nRBC: 0 % (ref 0.0–0.2)

## 2019-04-19 LAB — RETICULOCYTES
Immature Retic Fract: 14.1 % (ref 2.3–15.9)
RBC.: 2.39 MIL/uL — ABNORMAL LOW (ref 4.22–5.81)
Retic Count, Absolute: 32.5 10*3/uL (ref 19.0–186.0)
Retic Ct Pct: 1.4 % (ref 0.4–3.1)

## 2019-04-19 LAB — URINALYSIS, ROUTINE W REFLEX MICROSCOPIC
Bilirubin Urine: NEGATIVE
Glucose, UA: NEGATIVE mg/dL
Ketones, ur: NEGATIVE mg/dL
Leukocytes,Ua: NEGATIVE
Nitrite: NEGATIVE
Protein, ur: 100 mg/dL — AB
Specific Gravity, Urine: 1.01 (ref 1.005–1.030)
pH: 5 (ref 5.0–8.0)

## 2019-04-19 LAB — TROPONIN I (HIGH SENSITIVITY)
Troponin I (High Sensitivity): 12 ng/L (ref <18)
Troponin I (High Sensitivity): 40 ng/L — ABNORMAL HIGH (ref <18)

## 2019-04-19 LAB — FOLATE: Folate: 6.8 ng/mL (ref >5.9)

## 2019-04-19 LAB — POC OCCULT BLOOD, ED: Fecal Occult Bld: NEGATIVE

## 2019-04-19 LAB — PREPARE RBC (CROSSMATCH)

## 2019-04-19 LAB — RAPID URINE DRUG SCREEN, HOSP PERFORMED
Amphetamines: NOT DETECTED
Barbiturates: NOT DETECTED
Benzodiazepines: NOT DETECTED
Cocaine: NOT DETECTED
Opiates: NOT DETECTED
Tetrahydrocannabinol: NOT DETECTED

## 2019-04-19 LAB — PROTIME-INR
INR: 1 (ref 0.8–1.2)
Prothrombin Time: 13.4 seconds (ref 11.4–15.2)

## 2019-04-19 LAB — CREATININE, URINE, RANDOM: Creatinine, Urine: 79.43 mg/dL

## 2019-04-19 LAB — MAGNESIUM: Magnesium: 1.9 mg/dL (ref 1.7–2.4)

## 2019-04-19 LAB — HEMOGLOBIN AND HEMATOCRIT, BLOOD
HCT: 23.8 % — ABNORMAL LOW (ref 39.0–52.0)
Hemoglobin: 8.2 g/dL — ABNORMAL LOW (ref 13.0–17.0)

## 2019-04-19 LAB — VITAMIN B12: Vitamin B-12: 254 pg/mL (ref 180–914)

## 2019-04-19 LAB — HEMOGLOBIN A1C
Hgb A1c MFr Bld: 4.8 % (ref 4.8–5.6)
Mean Plasma Glucose: 91.06 mg/dL

## 2019-04-19 LAB — FERRITIN: Ferritin: 328 ng/mL (ref 24–336)

## 2019-04-19 MED ORDER — HEPARIN SODIUM (PORCINE) 5000 UNIT/ML IJ SOLN: 5000.0000 [IU] | Freq: Three times a day (TID) | INTRAMUSCULAR | Status: DC

## 2019-04-19 MED ORDER — ASPIRIN 81 MG PO CHEW
81.0000 mg | CHEWABLE_TABLET | Freq: Every day | ORAL | Status: DC
  Administered 2019-04-19 – 2019-04-22 (×4): 81 mg via ORAL
  Filled 2019-04-19 (×4): qty 1

## 2019-04-19 MED ORDER — SODIUM CHLORIDE 0.9 % IV SOLN
10.0000 mL/h | Freq: Once | INTRAVENOUS | Status: AC
  Administered 2019-04-19: 10 mL/h via INTRAVENOUS

## 2019-04-19 MED ORDER — ACETAMINOPHEN 325 MG PO TABS: 650.0000 mg | ORAL_TABLET | Freq: Four times a day (QID) | ORAL | Status: DC | PRN

## 2019-04-19 MED ORDER — ATORVASTATIN CALCIUM 40 MG PO TABS
40.0000 mg | ORAL_TABLET | Freq: Every day | ORAL | Status: DC
  Administered 2019-04-19 – 2019-04-21 (×3): 40 mg via ORAL
  Filled 2019-04-19 (×3): qty 1

## 2019-04-19 MED ORDER — ONDANSETRON HCL 4 MG/2ML IJ SOLN
4.0000 mg | Freq: Once | INTRAMUSCULAR | Status: AC
  Administered 2019-04-19: 4 mg via INTRAVENOUS
  Filled 2019-04-19: qty 2

## 2019-04-19 MED ORDER — ONDANSETRON HCL 4 MG/2ML IJ SOLN: 4.0000 mg | Freq: Three times a day (TID) | INTRAMUSCULAR | Status: DC | PRN

## 2019-04-19 MED ORDER — FUROSEMIDE 10 MG/ML IJ SOLN
80.0000 mg | Freq: Two times a day (BID) | INTRAMUSCULAR | Status: DC
  Administered 2019-04-19 – 2019-04-20 (×3): 80 mg via INTRAVENOUS
  Filled 2019-04-19 (×3): qty 8

## 2019-04-19 MED ORDER — PRAVASTATIN SODIUM 10 MG PO TABS: 20.0000 mg | ORAL_TABLET | Freq: Every day | ORAL | Status: DC

## 2019-04-19 MED ORDER — SODIUM CHLORIDE 0.9% IV SOLUTION
Freq: Once | INTRAVENOUS | Status: AC
  Administered 2019-04-19: 11:00:00 via INTRAVENOUS

## 2019-04-19 MED ORDER — MORPHINE SULFATE (PF) 4 MG/ML IV SOLN
4.0000 mg | Freq: Once | INTRAVENOUS | Status: AC
  Administered 2019-04-19: 4 mg via INTRAVENOUS
  Filled 2019-04-19: qty 1

## 2019-04-19 MED ORDER — HYDRALAZINE HCL 20 MG/ML IJ SOLN: 5.0000 mg | INTRAMUSCULAR | Status: DC | PRN

## 2019-04-19 MED ORDER — NITROGLYCERIN 0.4 MG SL SUBL: 0.4000 mg | SUBLINGUAL_TABLET | SUBLINGUAL | Status: DC | PRN

## 2019-04-19 MED ORDER — METOPROLOL TARTRATE 100 MG PO TABS
100.0000 mg | ORAL_TABLET | Freq: Two times a day (BID) | ORAL | Status: DC
  Administered 2019-04-19 – 2019-04-22 (×7): 100 mg via ORAL
  Filled 2019-04-19 (×8): qty 1

## 2019-04-19 MED ORDER — HYDRALAZINE HCL 25 MG PO TABS
25.0000 mg | ORAL_TABLET | Freq: Three times a day (TID) | ORAL | Status: DC
  Administered 2019-04-19 – 2019-04-22 (×11): 25 mg via ORAL
  Filled 2019-04-19 (×11): qty 1

## 2019-04-19 MED ORDER — HEPARIN SODIUM (PORCINE) 5000 UNIT/ML IJ SOLN
5000.0000 [IU] | Freq: Three times a day (TID) | INTRAMUSCULAR | Status: DC
  Administered 2019-04-19 – 2019-04-22 (×10): 5000 [IU] via SUBCUTANEOUS
  Filled 2019-04-19 (×10): qty 1

## 2019-04-19 MED ORDER — AMLODIPINE BESYLATE 10 MG PO TABS
10.0000 mg | ORAL_TABLET | Freq: Every day | ORAL | Status: DC
  Administered 2019-04-19 – 2019-04-22 (×4): 10 mg via ORAL
  Filled 2019-04-19 (×5): qty 1

## 2019-04-19 MED ORDER — POTASSIUM CHLORIDE CRYS ER 20 MEQ PO TBCR
40.0000 meq | EXTENDED_RELEASE_TABLET | Freq: Once | ORAL | Status: AC
  Administered 2019-04-19: 40 meq via ORAL
  Filled 2019-04-19: qty 2

## 2019-04-19 MED ORDER — MORPHINE SULFATE (PF) 2 MG/ML IV SOLN: 2.0000 mg | INTRAVENOUS | Status: DC | PRN

## 2019-04-19 MED ORDER — SODIUM CHLORIDE 0.9% FLUSH: 3.0000 mL | Freq: Once | INTRAVENOUS | Status: DC

## 2019-04-19 NOTE — Plan of Care (Signed)
  Problem: Pain Managment: Goal: General experience of comfort will improve Outcome: Progressing   Problem: Safety: Goal: Ability to remain free from injury will improve Outcome: Progressing   Problem: Skin Integrity: Goal: Risk for impaired skin integrity will decrease Outcome: Progressing   Problem: Clinical Measurements: Goal: Will remain free from infection Outcome: Progressing

## 2019-04-19 NOTE — Plan of Care (Signed)
  Problem: Clinical Measurements: Goal: Ability to maintain clinical measurements within normal limits will improve Outcome: Progressing Goal: Will remain free from infection Outcome: Progressing Goal: Diagnostic test results will improve Outcome: Progressing Goal: Respiratory complications will improve Outcome: Not Applicable Goal: Cardiovascular complication will be avoided Outcome: Progressing

## 2019-04-19 NOTE — Progress Notes (Signed)
PROGRESS NOTE    Jesus Ayala  YYT:[id number] DOB: 1951/07/06 DOA: 04/19/2019 PCP: Rudy Jew  Brief Narrative: 68 year old male with history of hypertension, stage III-IV kidney disease, dyslipidemia Presented to the emergency room with chest pain ongoing off-and-on with exertion for 2 weeks. -He was sent to see a cardiologist in [facility] who had ordered a stress test on the patient which was supposed to be done tomorrow, Presented to the emergency room with chest pain ongoing off-and-on with exertion for 2 weeks, and had an echocardiogram done on 6/17 in [facility] which showed EF of 55%, mildly dilated left atrium and mild MR was prescribed sublingual nitroglycerin and he had been using this for exertional chest pain with relief -Presented to the emergency room he EKG showed ST depressions in multiple leads, LVH, troponin was negative, chest x-ray was unremarkable, labs noted hemoglobin of 6.0, creatinine of 9.7  Assessment & Plan:   Chest pain concerning for unstable angina -Troponin negative x1, follow trend -Recent echocardiogram at outside hospital noted normal EF -EKG shows LVH with repolarization abnormalities, ST depression in inferior leads -Cardiology consulted and following  Acute normocytic anemia -No recent labs in our system, last CBC from 2011 ranged from 10-11 -No overt bleeding, Hemoccult negative, normal MCV and RDW argue against significant iron deficiency -Follow-up anemia panel, given 1 unit of PRBC, ordered another unit this morning -Suspect this may be from progressive CKD -Monitor  Acute kidney injury on stage IV CKD -Recent baseline creatinine was 2.5 per [place] MD notes, on admission creatinine was 9.7, with metabolic acidosis -Was on ARB and Lasix at baseline which were held, denies NSAID use -Has trace edema but his lungs are clear -Nephrology consulted, urine output is 600 cc overnight, renal ultrasound without hydronephrosis suggestive  of CKD -Has mild proteinuria on urinalysis, otherwise bland -no acute indications for dialysis at this time  Hypertension -Stable, losartan held, continue amlodipine and Lopressor  Dyslipidemia -Started on Lipitor  Hypokalemia -Replace  DVT prophylaxis: Add subcutaneous heparin Code Status: Full code Family Communication: No family at bedside Disposition Plan: Home pending improvement in AKI, anemia  Consultants:   Cardiology  Renal   Procedures:   Antimicrobials:    Subjective: -Denies any chest pain or dyspnea at this time  Objective: Vitals:   04/19/19 0415 04/19/19 0416 04/19/19 0616 04/19/19 0726  BP: (!) 144/80 (!) 144/80 131/85 134/84  Pulse: 83 85 77 82  Resp: 18 18 18 18   Temp: 98.2 F (36.8 C) 98.2 F (36.8 C) 97.8 F (36.6 C) 98.2 F (36.8 C)  TempSrc: Oral Oral Oral Oral  SpO2: 98% 98% 96% 95%  Weight: 78.5 kg     Height: 5\' 11"  (1.803 m)       Intake/Output Summary (Last 24 hours) at 04/19/2019 1018 Last data filed at 04/19/2019 1001 Gross per 24 hour  Intake 331.67 ml  Output 1000 ml  Net -668.33 ml   Filed Weights   04/19/19 0024 04/19/19 0415  Weight: 76.7 kg 78.5 kg    Examination:  General exam: Appears calm and comfortable  Respiratory system: Clear to auscultation. Respiratory effort normal. Cardiovascular system: S1 & S2 heard, RRR. No JVD, murmurs, rubs, gallops Gastrointestinal system: Abdomen is nondistended, soft and nontender.Normal bowel sounds heard. Central nervous system: Alert and oriented. No focal neurological deficits. Extremities: Symmetric 5 x 5 power. Skin: No rashes, lesions or ulcers Psychiatry: Judgement and insight appear normal. Mood & affect appropriate.  Data Reviewed:   CBC: Recent Labs  Lab 04/19/19 0033  WBC 6.4  HGB 6.0*  HCT 18.6*  MCV 89.4  PLT 496   Basic Metabolic Panel: Recent Labs  Lab 04/19/19 0033  NA 134*  K 3.4*  CL 103  CO2 14*  GLUCOSE 120*  BUN 92*  CREATININE  9.72*  CALCIUM 8.2*   GFR: Estimated Creatinine Clearance: 8 mL/min (A) (by C-G formula based on SCr of 9.72 mg/dL (H)). Liver Function Tests: No results for input(s): AST, ALT, ALKPHOS, BILITOT, PROT, ALBUMIN in the last 168 hours. No results for input(s): LIPASE, AMYLASE in the last 168 hours. No results for input(s): AMMONIA in the last 168 hours. Coagulation Profile: Recent Labs  Lab 04/19/19 0033  INR 1.0   Cardiac Enzymes: No results for input(s): CKTOTAL, CKMB, CKMBINDEX, TROPONINI in the last 168 hours. BNP (last 3 results) No results for input(s): PROBNP in the last 8760 hours. HbA1C: No results for input(s): HGBA1C in the last 72 hours. CBG: No results for input(s): GLUCAP in the last 168 hours. Lipid Profile: No results for input(s): CHOL, HDL, LDLCALC, TRIG, CHOLHDL, LDLDIRECT in the last 72 hours. Thyroid Function Tests: No results for input(s): TSH, T4TOTAL, FREET4, T3FREE, THYROIDAB in the last 72 hours. Anemia Panel: Recent Labs    04/19/19 0848  RETICCTPCT 1.4   Urine analysis:    Component Value Date/Time   COLORURINE STRAW (A) 04/19/2019 0505   APPEARANCEUR CLEAR 04/19/2019 0505   LABSPEC 1.010 04/19/2019 0505   PHURINE 5.0 04/19/2019 0505   GLUCOSEU NEGATIVE 04/19/2019 0505   HGBUR MODERATE (A) 04/19/2019 0505   BILIRUBINUR NEGATIVE 04/19/2019 0505   KETONESUR NEGATIVE 04/19/2019 0505   PROTEINUR 100 (A) 04/19/2019 0505   UROBILINOGEN 1.0 12/12/2009 0201   NITRITE NEGATIVE 04/19/2019 0505   LEUKOCYTESUR NEGATIVE 04/19/2019 0505   Sepsis Labs: @LABRCNTIP (procalcitonin:4,lacticidven:4)  )No results found for this or any previous visit (from the past 240 hour(s)).       Radiology Studies: Dg Chest 2 View  Result Date: 04/19/2019 CLINICAL DATA:  Chest pain EXAM: CHEST - 2 VIEW COMPARISON:  March 27, 2019 FINDINGS: The heart size and mediastinal contours are within normal limits. Both lungs are clear. The visualized skeletal structures are  unremarkable. There is stable height loss of several thoracic vertebral bodies. IMPRESSION: No active cardiopulmonary disease. Electronically Signed   By: Constance Holster M.D.   On: 04/19/2019 01:45   US Renal  Result Date: 04/19/2019 CLINICAL DATA:  Hypertension, CKD stage 3 EXAM: RENAL / URINARY TRACT ULTRASOUND COMPLETE COMPARISON:  None. FINDINGS: Right Kidney: Renal measurements: 8.9 x 4.2 x 4.3 cm = volume: 84 mL. Cortex is diffusely echogenic. No mass or hydronephrosis visualized. Left Kidney: Renal measurements: 8 x 5 x 4.8 cm = volume: 100 mL. Cortex is diffusely echogenic. No mass or hydronephrosis visualized. Bladder: Appears normal for degree of bladder distention. No ureteral jets visualized. IMPRESSION: 1. Both kidneys are diffusely echogenic compatible with given history of chronic kidney disease. 2. No acute findings.  No hydronephrosis. Electronically Signed   By: Franki Cabot M.D.   On: 04/19/2019 07:12        Scheduled Meds: . sodium chloride   Intravenous Once  . amLODipine  10 mg Oral Daily  . aspirin  81 mg Oral Daily  . atorvastatin  40 mg Oral q1800  . hydrALAZINE  25 mg Oral Q8H  . metoprolol tartrate  100 mg Oral BID  . sodium chloride flush  3 mL Intravenous Once   Continuous Infusions:   LOS: 0 days    Time spent: 32min    Domenic Polite, MD [facility] Page via [email or web address], password [email or web address] After 7PM please contact night-coverage  04/19/2019, 10:18 AM

## 2019-04-19 NOTE — Progress Notes (Signed)
Per CCMD pt had an accelerated idioventricular rhythm non sustained. Upon reassessing pt, he stated that he does not have any chest pains and states that does not feels any different. monitor EKG printed and placed in pts chart.   Spoke with Broadus John MD. EKG is to be done when pt has this non-sustained VT rhythms. Will pass this information to night shift nurse.

## 2019-04-19 NOTE — ED Provider Notes (Signed)
TIME SEEN: 12:40 AM  CHIEF COMPLAINT: Chest pain, shortness of breath  HPI: Patient is a 68 year old male with history of hypertension, hyperlipidemia, previous tobacco use who presents to the emergency department with complaints of chest pain and shortness of breath.  Chest pain is in the center of his chest without radiation and is described as a sharp, tight pain that is worse with exertion.  He has had shortness of breath, nausea and diaphoresis but no dizziness.  States pain is been intermittent for several days and worse with exertion but tonight came on at rest at 10:30 PM.  Denies any fevers or cough.  States he is scheduled for a stress test with his cardiologist with [facility] on Monday, June 29.  No history of CAD.  No history of cardiac catheterization.  No history of PE or DVT.  Took 3 nitroglycerin at home without relief.  Was given 2 nitroglycerin with EMS with minimal relief.  Also given 325 mg of aspirin with EMS.  spouse Susano Cleckler @ [phone]    ROS: See HPI Constitutional: no fever  Eyes: no drainage  ENT: no runny nose   Cardiovascular:   chest pain  Resp:  SOB  GI: no vomiting GU: no dysuria Integumentary: no rash  Allergy: no hives  Musculoskeletal: no leg swelling  Neurological: no slurred speech ROS otherwise negative  PAST MEDICAL HISTORY/PAST SURGICAL HISTORY:  Past Medical History:  Diagnosis Date  . Hypertension     MEDICATIONS:  Prior to Admission medications   Not on File    ALLERGIES:  Allergies  Allergen Reactions  . Enalapril Swelling    ANGIOEDEMA of lips    SOCIAL HISTORY:  Social History   Tobacco Use  . Smoking status: Never Smoker  Substance Use Topics  . Alcohol use: Yes    FAMILY HISTORY: No family history on file.  EXAM: BP (!) 146/91   Pulse (!) 102   Temp 98.5 F (36.9 C)   Resp 19   Ht 5\' 11"  (1.803 m)   Wt 76.7 kg   SpO2 100%   BMI 23.57 kg/m  CONSTITUTIONAL: Alert and oriented and  responds appropriately to questions. Well-appearing; well-nourished HEAD: Normocephalic EYES: Conjunctivae clear, pupils appear equal, EOMI ENT: normal nose; moist mucous membranes NECK: Supple, no meningismus, no nuchal rigidity, no LAD  CARD: RRR; S1 and S2 appreciated; no murmurs, no clicks, no rubs, no gallops RESP: Normal chest excursion without splinting or tachypnea; breath sounds clear and equal bilaterally; no wheezes, no rhonchi, no rales, no hypoxia or respiratory distress, speaking full sentences ABD/GI: Normal bowel sounds; non-distended; soft, non-tender, no rebound, no guarding, no peritoneal signs, no hepatosplenomegaly RECTAL:  Normal rectal tone, no gross blood or melena, guaiac negative, no hemorrhoids appreciated, nontender rectal exam, no fecal impaction BACK:  The back appears normal and is non-tender to palpation, there is no CVA tenderness EXT: Normal ROM in all joints; non-tender to palpation; no edema; normal capillary refill; no cyanosis, no calf tenderness or swelling    SKIN: Normal color for age and race; warm; no rash NEURO: Moves all extremities equally PSYCH: The patient's mood and manner are appropriate. Grooming and personal hygiene are appropriate.  MEDICAL DECISION MAKING: Patient here with chest pain and shortness of breath.  He has multiple risk factors for ACS.  Intermittent chest pain for the past several days worse with exertion but came on tonight at 10:30 PM while at rest.  EKG shows no ischemic abnormality.  Will obtain cardiac labs.  Will give morphine for pain control.  Doubt PE, dissection.  ED PROGRESS: Patient found to be anemic with a hemoglobin of 6.0.  His symptoms today may be related to symptomatic anemia.  He is guaiac negative and denies bloody stools or melena.  He is not on antiplatelets or anticoagulants.  It appears that he has a creatinine of greater than 9.  Looks like he has not had his creatinine checked since 2018.  His potassium  today is normal.  Will admit for acute renal failure and symptomatic anemia.  I have ordered 1 unit of packed red blood cells.  Patient's high sensitive troponin is 12.  2:30 AM  Updated patient's wife.  She states that he had renal failure 10 years ago and was in a coma for 1 month at [facility] after incarcerated inguinal hernia repair.  He saw a nephrologist in [place] afterwards.  Never was on dialysis.     2:41 AM Discussed patient's case with hospitalist, Dr. Blaine Hamper.  I have recommended admission and patient (and family if present) agree with this plan. Admitting physician will place admission orders.   I reviewed all nursing notes, vitals, pertinent previous records, EKGs, lab and urine results, imaging (as available).  Patient does not need urgent or emergent dialysis.  His blood pressure is normal.  He is not hyperkalemic.  He is not volume overloaded.   EKG Interpretation  Date/Time:  Sunday April 19 2019 00:51:45 EDT Ventricular Rate:  93 PR Interval:    QRS Duration: 106 QT Interval:  391 QTC Calculation: 487 R Axis:   -32 Text Interpretation:  Sinus rhythm Left atrial enlargement Abnormal R-wave progression, early transition LVH with secondary repolarization abnormality Borderline prolonged QT interval No significant change since last tracing Confirmed by Pryor Curia [phone]) on 04/19/2019 12:57:08 AM        CRITICAL CARE Performed by: Cyril Mourning Laquonda Welby   Total critical care time: 55 minutes  Critical care time was exclusive of separately billable procedures and treating other patients.  Critical care was necessary to treat or prevent imminent or life-threatening deterioration.  Critical care was time spent personally by me on the following activities: development of treatment plan with patient and/or surrogate as well as nursing, discussions with consultants, evaluation of patient's response to treatment, examination of patient, obtaining history from patient or  surrogate, ordering and performing treatments and interventions, ordering and review of laboratory studies, ordering and review of radiographic studies, pulse oximetry and re-evaluation of patient's condition.      Jaden Abreu, Delice Bison, DO 04/19/19 [phone]

## 2019-04-19 NOTE — ED Triage Notes (Signed)
Pt presents to ED with complaint of chest pain. Pt reports pt began early in the day and worsened through out. Pt states pain is centrally located and describes it as a pressure like sensation. He reports associated shortness of breath.  Pt reports taking nitro x3 and 324 ASA prior to arrival. Pain improves with nitro. VSS. Respirations even and unlabored.

## 2019-04-19 NOTE — Consult Note (Addendum)
Renal Service Consult Note Jesus Ayala  Jesus Ayala 04/19/2019 Sol Blazing Requesting Physician: Jesus Ayala  Reason for Consult:  AoCKD3 HPI: The patient is a 68 y.o. year-old with hx of HTN, HL and CKD III admitted for chest pain last night. In ED creat was 9.7, last creat in system from 2011 was AKI episode peak creat 12 and dc creat 1.9.  We are asked to see for renal failure.   Patient states was f/b Jesus Ayala at [facility] , last visit last year, has appt w/ new MD at [facility] ([place] in early July.  Does not know his baseline renal function. They had not talked about dialysis he says. Denies any recent CT scan/ nsaids or antibiotics.  Voiding ok w/o issues, no change in urine color. No fatigue or SOB, no loss of appetite or nausea/ vomiting.    EPIC  Date    Creat  1/29 - 12/20/2009 1.0 > 12.41> 1.9 at dc  April 2011  1.9- 2.1  04/19/19  9.72  ROS  denies CP  no joint pain   no HA  no blurry vision  no rash  no diarrhea  no nausea/ vomiting  no dysuria  no difficulty voiding  no change in urine color    Past Medical History  Past Medical History:  Diagnosis Date  . CKD (chronic kidney disease), stage III ([place])   . HLD (hyperlipidemia)   . Hypertension    Past Surgical History  Past Surgical History:  Procedure Laterality Date  . HERNIA REPAIR     Family History  Family History  Problem Relation Age of Onset  . Lung cancer Father   . Hypertension Brother    Social History  reports that he has quit smoking. He has never used smokeless tobacco. He reports current alcohol use. He reports that he does not use drugs. Allergies  Allergies  Allergen Reactions  . Enalapril Swelling    ANGIOEDEMA of lips ANGIOEDEMA of lips ANGIOEDEMA of lips ANGIOEDEMA of lips   Home medications Prior to Admission medications   Medication Sig Start Date End Date Taking? Authorizing Provider  amLODipine (NORVASC) 10 MG tablet Take 10 mg by mouth daily. 04/12/19  Yes  [provider]  aspirin 81 MG chewable tablet Chew 81 mg by mouth daily.   Yes [provider]  furosemide (LASIX) 80 MG tablet Take 80 mg by mouth daily. 01/22/19  Yes [provider]  losartan (COZAAR) 50 MG tablet Take 50 mg by mouth daily. 04/12/19  Yes [provider]  metoprolol tartrate (LOPRESSOR) 100 MG tablet Take 100 mg by mouth 2 (two) times a day. 04/12/19  Yes [provider]  nitroGLYCERIN (NITROSTAT) 0.4 MG SL tablet Place 0.4 mg under the tongue every 5 (five) minutes as needed for chest pain.  03/27/19  Yes [provider]  pravastatin (PRAVACHOL) 20 MG tablet Take 20 mg by mouth daily. 03/27/19  Yes [provider]   Liver Function Tests No results for input(s): AST, ALT, ALKPHOS, BILITOT, PROT, ALBUMIN in the last 168 hours. No results for input(s): LIPASE, AMYLASE in the last 168 hours. CBC Recent Labs  Lab 04/19/19 0033  WBC 6.4  HGB 6.0*  HCT 18.6*  MCV 89.4  PLT 449   Basic Metabolic Panel Recent Labs  Lab 04/19/19 0033  NA 134*  K 3.4*  CL 103  CO2 14*  GLUCOSE 120*  BUN 92*  CREATININE 9.72*  CALCIUM 8.2*   Iron/TIBC/Ferritin/ %  Sat    Component Value Date/Time   IRON 17 (L) 12/05/2009 1330   TIBC 217 12/05/2009 1330   FERRITIN 1871 Result confirmed by automatic dilution. (H) 12/05/2009 1330   IRONPCTSAT 8 (L) 12/05/2009 1330    Vitals:   04/19/19 0415 04/19/19 0416 04/19/19 0616 04/19/19 0726  BP: (!) 144/80 (!) 144/80 131/85 134/84  Pulse: 83 85 77 82  Resp: _0 Temp: 98.2 F (36.8 C) 98.2 F (36.8 C) 97.8 F (36.6 C) 98.2 F (36.8 C)  TempSrc: Oral Oral Oral Oral  SpO2: 98% 98% 96% 95%  Weight: 78.5 kg     Height: _1  (1.803 m)       Exam Gen alert, no distress, calm No rash, cyanosis or gangrene Sclera anicteric, throat clear  +JVD to angle of jaw Chest clear bilat to bases, no rales or wheezing RRR no MRG Abd soft ntnd no mass or ascites +bs GU normal  male MS no joint effusions or deformity Ext 1-2+ pretib/ pedal edema Neuro is alert, Ox 3 , nf, no asterixis   Na 134  K 3.4  BUN 92  Cr 9.72  eGFR 5   Hb 6  plt 206   Home meds:  - amlodipine 10/ metoprolol 100 bid/ losartan 50 qd/ furosemide 80 qd  - aspirin 81/ sl ntg prn/ pravastatin 20 qd  UA 100 prot, 0-5 rbc, no wbc  CXR 6/28 > IMPRESSION: No active cardiopulmonary disease  Renal US 6/28 > Right Kidney: 8.9 cm, Cortex is diffusely echogenic. No mass or hydronephrosis visualized. Left Kidney: 8 cm, cortex is diffusely echogenic. No mass or hydronephrosis visualized.  Bladder: Appears normal for degree of bladder distention. No ureteral jets visualized.  Assessment/ Plan: 1. Renal failure - pt w/ known CKD III from 2011 when Cr was 2.0. Last creat 1 yr ago in 2019 was 2.4 at [place].  Not sure cause of bump in creatinine. No dehydrating illness, nsaids/ abx.  Renal US > no hydro, echogenic smallish kidneys.  Looks vol overloaded on exam w/ jvd/ LE edema. CXR clear.  Get ECHO. Will start IV lasix 80 bid. Repeat creat in am.  No uremic symptoms just yet. Will follow.  2. HTN - holding ARB, cont other meds   3. HL      Kelly Splinter  MD 04/19/2019, 10:43 AM

## 2019-04-19 NOTE — H&P (Signed)
History and Physical    Jesus Ayala EHU:[id number] DOB: [birth year] DOA: 04/19/2019  Referring MD/NP/PA:   PCP: Seward Carol, PA-C   Patient coming from:  The patient is coming from home.  At baseline, pt is independent for most of ADL.        Chief Complaint: chest pain  HPI: Jesus Ayala is a 68 y.o. male with medical history significant of Hypertension, hyperlipidemia, former smoker, CKD stage III, who presents with chest pain.  Patient states that he has been having intermittent chest pain for several days, which has worsened today.  The chest pain is located in the substernal area, initially 8 out of 10 severity, currently 1 out of 10 in severity, pressure-like, nonradiating. He took aspirin 324 mg and nitroglycerin at home.  It is associated with mild shortness of breath.  No cough, fever or chills.  No runny nose or sore throat.  Patient denies any nausea vomiting, diarrhea, abdominal pain, symptoms of UTI or unilateral weakness.  No dark stool or bloody stool noted.  Patient states that she used to see Dr. Florene Glen [facility] [place] [facility], last seen was more than 1 year ago.  ED Course: pt was found to have pending COVID-19 test, high sensitive troponin 12, negative FOBT, hemoglobin dropped from 12.1 on 12/10/2016 6.0, worsening renal function with creatinine up from 1.57 on 12/10/2016 9.72, BUN 92, WBC 6.4, potassium 3.4, temperature normal, heart rate in 90s, oxygen saturation 97% on room air, blood pressure 141/87, chest x-ray negative.  Patient is placed on telemetry bed for observation.  Review of Systems:   General: no fevers, chills, no body weight gain, has fatigue HEENT: no blurry vision, hearing changes or sore throat Respiratory: has dyspnea, no coughing, wheezing CV: has chest pain, no palpitations GI: no nausea, vomiting, abdominal pain, diarrhea, constipation GU: no dysuria, burning on urination, increased urinary frequency, hematuria  Ext: has trace leg edema  Neuro: no unilateral weakness, numbness, or tingling, no vision change or hearing loss Skin: no rash, no skin tear. MSK: No muscle spasm, no deformity, no limitation of range of movement in spin Heme: No easy bruising.  Travel history: No recent long distant travel.  Allergy:  Allergies  Allergen Reactions  . Enalapril Swelling    ANGIOEDEMA of lips ANGIOEDEMA of lips ANGIOEDEMA of lips ANGIOEDEMA of lips    Past Medical History:  Diagnosis Date  . CKD (chronic kidney disease), stage III ([place])   . HLD (hyperlipidemia)   . Hypertension     Past Surgical History:  Procedure Laterality Date  . HERNIA REPAIR      Social History:  reports that he has quit smoking. He has never used smokeless tobacco. He reports current alcohol use. He reports that he does not use drugs.  Family History:  Family History  Problem Relation Age of Onset  . Lung cancer Father   . Hypertension Brother      Prior to Admission medications   Medication Sig Start Date End Date Taking? Authorizing Provider  amLODipine (NORVASC) 10 MG tablet Take 10 mg by mouth daily. 04/12/19  Yes [provider]  aspirin 81 MG chewable tablet Chew 81 mg by mouth daily.   Yes [provider]  furosemide (LASIX) 80 MG tablet Take 80 mg by mouth daily. 01/22/19  Yes [provider]  losartan (COZAAR) 50 MG tablet Take 50 mg by mouth daily. 04/12/19  Yes [provider]  metoprolol tartrate (LOPRESSOR) 100 MG tablet Take  100 mg by mouth 2 (two) times a day. 04/12/19  Yes [provider]  nitroGLYCERIN (NITROSTAT) 0.4 MG SL tablet Place 0.4 mg under the tongue every 5 (five) minutes as needed for chest pain.  03/27/19  Yes [provider]  pravastatin (PRAVACHOL) 20 MG tablet Take 20 mg by mouth daily. 03/27/19  Yes [provider]    Physical Exam: Vitals:   04/19/19 0251 04/19/19 0306 04/19/19 0315 04/19/19 0330  BP: 137/82 (!) 145/88 (!) 145/82 138/83  Pulse:  93 97 95 90  Resp: 16 15 19 20   Temp: 98.6 F (37 C) 98.4 F (36.9 C) 98.6 F (37 C)   TempSrc: Oral Oral    SpO2: 98% 98% 99% 98%  Weight:      Height:       General: Not in acute distress. Pale looking. HEENT:       Eyes: PERRL, EOMI, no scleral icterus.       ENT: No discharge from the ears and nose, no pharynx injection, no tonsillar enlargement.        Neck: No JVD, no bruit, no mass felt. Heme: No neck lymph node enlargement. Cardiac: S1/S2, RRR, No murmurs, No gallops or rubs. Respiratory: No rales, wheezing, rhonchi or rubs. GI: Soft, nondistended, nontender, no rebound pain, no organomegaly, BS present. GU: No hematuria Ext: has trace leg edema bilaterally. 2+DP/PT pulse bilaterally. Musculoskeletal: No joint deformities, No joint redness or warmth, no limitation of ROM in spin. Skin: No rashes.  Neuro: Alert, oriented X3, cranial nerves II-XII grossly intact, moves all extremities normally.  Psych: Patient is not psychotic, no suicidal or hemocidal ideation.  Labs on Admission: I have personally reviewed following labs and imaging studies  CBC: Recent Labs  Lab 04/19/19 0033  WBC 6.4  HGB 6.0*  HCT 18.6*  MCV 89.4  PLT 295   Basic Metabolic Panel: Recent Labs  Lab 04/19/19 0033  NA 134*  K 3.4*  CL 103  CO2 14*  GLUCOSE 120*  BUN 92*  CREATININE 9.72*  CALCIUM 8.2*   GFR: Estimated Creatinine Clearance: 8 mL/min (A) (by C-G formula based on SCr of 9.72 mg/dL (H)). Liver Function Tests: No results for input(s): AST, ALT, ALKPHOS, BILITOT, PROT, ALBUMIN in the last 168 hours. No results for input(s): LIPASE, AMYLASE in the last 168 hours. No results for input(s): AMMONIA in the last 168 hours. Coagulation Profile: Recent Labs  Lab 04/19/19 0033  INR 1.0   Cardiac Enzymes: No results for input(s): CKTOTAL, CKMB, CKMBINDEX, TROPONINI in the last 168 hours. BNP (last 3 results) No results for input(s): PROBNP in the last 8760 hours. HbA1C: No  results for input(s): HGBA1C in the last 72 hours. CBG: No results for input(s): GLUCAP in the last 168 hours. Lipid Profile: No results for input(s): CHOL, HDL, LDLCALC, TRIG, CHOLHDL, LDLDIRECT in the last 72 hours. Thyroid Function Tests: No results for input(s): TSH, T4TOTAL, FREET4, T3FREE, THYROIDAB in the last 72 hours. Anemia Panel: No results for input(s): VITAMINB12, FOLATE, FERRITIN, TIBC, IRON, RETICCTPCT in the last 72 hours. Urine analysis:    Component Value Date/Time   COLORURINE AMBER BIOCHEMICALS MAY BE AFFECTED BY COLOR (A) 12/12/2009 0201   APPEARANCEUR CLOUDY (A) 12/12/2009 0201   LABSPEC 1.035 (H) 12/12/2009 0201   PHURINE 5.0 12/12/2009 0201   GLUCOSEU 100 (A) 12/12/2009 0201   HGBUR TRACE (A) 12/12/2009 0201   BILIRUBINUR MODERATE (A) 12/12/2009 0201   KETONESUR 15 (A) 12/12/2009 0201   PROTEINUR >  300 (A) 12/12/2009 0201   UROBILINOGEN 1.0 12/12/2009 0201   NITRITE POSITIVE (A) 12/12/2009 0201   LEUKOCYTESUR SMALL (A) 12/12/2009 0201   Sepsis Labs: @LABRCNTIP (procalcitonin:4,lacticidven:4) )No results found for this or any previous visit (from the past 240 hour(s)).   Radiological Exams on Admission: Dg Chest 2 View  Result Date: 04/19/2019 CLINICAL DATA:  Chest pain EXAM: CHEST - 2 VIEW COMPARISON:  March 27, 2019 FINDINGS: The heart size and mediastinal contours are within normal limits. Both lungs are clear. The visualized skeletal structures are unremarkable. There is stable height loss of several thoracic vertebral bodies. IMPRESSION: No active cardiopulmonary disease. Electronically Signed   By: Constance Holster M.D.   On: 04/19/2019 01:45     EKG: Independently reviewed.  Sinus rhythm, QTC 487, LVH, LAD, LAE, early R wave progression.   Assessment/Plan Principal Problem:   Chest pain Active Problems:   HLD (hyperlipidemia)   Hypertension   Acute renal failure superimposed on stage 3 chronic kidney disease (HCC)   Hypokalemia   Normocytic  anemia   Chest pain: Patient has been having intermittent chest pain for several days, but troponin negative, EKG has no obvious ischemic change.  Chest x-ray negative.  Her chest pain has subsided after taking nitroglycerin and aspirin.  Possibly due to demand ischemia secondary to severe anemia with hemoglobin 6.0.  - will place on Tele bed for obs - trend trop, repeat EKG in the am  - prn Nitroglycerin, Morphine, and aspirin, pravastatin, metoprolol - Risk factor stratification: will check FLP and A1C, UDS - inpt card consult was requested via Epic  HLD (hyperlipidemia): -Pravastatin  Hypertension: Patient had 2D echo 04/07/2009 which showed EF of 55-60%. -Continue metoprolol -Hold Cozaar and Lasix due to worsening renal function -Start oral hydralazine 25 mg 3 times daily -IV hydralazine as needed  AoCKD-III: Baseline Cre is 1.57 on 12/10/16. Per Dr. Merilyn Baba clinic note in [place], pt had Cre of 2.5 in 2019. Pt's Cre is 9.72 and BUN 92 on admission.  Etiology is not clear.  Possibly due to progression of her chronic kidney disease.  Patient used to see Dr. Florene Glen for [place] [facility], but did not see him for more than 1 year.  Patient is taking Lasix and Cozaar, which may have contributed partially. - will not give more IVF, since patient is at high risk of developing fluid overload - pt is being transfused with 1 unit of blood - Follow up renal function by BMP - Check FeUrea - US-renal - check SPEP - please call renal in AM  Hypokalemia: mild. K=3.4 -will not give K now due to severely worsened renal function -Check magnesium level -Follow-up by BMP  Normocytic anemia: Hemoglobin dropped from 12.1 on 12/10/2016 to 6.0 today.  Patient did not notice any dark stool or bloody stool.  FOBT negative.  May be due to anemia secondary to chronic kidney disease. -Anemia panel -1U of blood was ordered by EDP     DVT ppx: SCD Code Status: Full code Family Communication: None at bed side  Disposition Plan:  Anticipate discharge back to previous home environment Consults called:  none Admission status: Obs / tele     Date of Service 04/19/2019    [facility]   If 7PM-7AM, please contact night-coverage [email or web address] Password [facility] 04/19/2019, 3:46 AM

## 2019-04-19 NOTE — Consult Note (Addendum)
Cardiology Consult    Patient ID: ANTAWAN MCHUGH MRN: [id number], DOB/AGE: 1951-01-01   Admit date: 04/19/2019 Date of Consult: 04/19/2019  Primary Physician: Seward Carol, PA-C Primary Cardiologist: [place] Requesting Provider: Ivor Costa, MD  Patient Profile    [place] Ayala is a 68 y.o. African-American male with a history of hypertension, hyperlipidemia, and CKD stage III, who is being seen today for the evaluation of chest pain at the request of Dr. Blaine Hamper.  History of Present Illness    [place] Ayala is a 68 year old male with the above history. He was recently seen by Dr. Layne Benton at [facility] on 04/02/2019 for intermittent chest pain that started 3-4 weeks prior to the visit. At that time, he described the pain as a sharp pain and reported that it was completely exertion. He would have pain every time he walked or tried to increased his pace that would resolve 3-4 minutes after stopping. He reported associated mild shortness of breath with the pain. At that visit, patient had a creatinine of 2.5. Echocardiogram was done on 04/08/2019 which showed [facility] showed LVEF of 55-60% mildly dilated left atrium and mild mitral regurgitation. Dr. Maryan Rued also ordered a stress test which is scheduled for tomorrow.  Patient presented to the ED early this morning around midnight for evaluation of chest pain. Patient reports continued exertional non-radiating chest pain since seeing Dr. Maryan Rued that occurs almost every other day now. Pain is almost always associated with exercise and resolves with rest. He has also noted improvement of chest pain with the use of sublingual Nitro. Patient states he has used a whole bottle of sublingual Nitro since seeing Dr. Maryan Rued. He reports he had an episode of exertional chest pain yesterday that resolved with Nitro but then chest pain returned as he was trying to go to sleep so patient decided to come to  the ED for further evaluation. He has some associated mild shortness of breath with the chest pain and notes feeling clammy but denies any true diaphoresis, nausea, or vomiting. He reports occasional palpitation ("fluttering") but no lightheadedness, dizziness, or syncope. He reports occasional episodes of PND but denies any orthopnea or edema. He states he snores at night but has never been diagnosed with sleep apnea. No recent fevers or illnesses. He reports he normal has mild nasal congestion in the morning that resolves throughout the day but denies any cough or known exposure to the coronavirus.   In the ED: Vitals stable. EKG showed normal sinus rhythm with minimal ST depression in multiple leads and LVH with some repolarization changes noted in anterior leads. Initial high-sensitivity troponin negative a 12. Chest x-ray showed no acute findings. WBC 6.4, Hgb 6.0, Plts 206. Na 134, K 3.4, Glucose 120, SCr 9.72. Negative hemoccult. COVID-19 testing pending. Patient was admitted for chest pain observation and further evaluation of acute renal failure and anemia. Patient received 1 unit of PRBCs this morning.  At the time of this evaluation, patient reports feeling tired but denies any chest pain or shortness of breath.   Patient has a remote smoking history but states he quit about 40 years ago. He reports drinking a couple of beers a night but denies any use of hard liquor or any recreational drug use. He has no known family history of heart disease.   Past Medical History   Past Medical History:  Diagnosis Date   CKD (chronic kidney disease),  stage III (HCC)    HLD (hyperlipidemia)    Hypertension     Past Surgical History:  Procedure Laterality Date   HERNIA REPAIR       Allergies  Allergies  Allergen Reactions   Enalapril Swelling    ANGIOEDEMA of lips ANGIOEDEMA of lips ANGIOEDEMA of lips ANGIOEDEMA of lips    Inpatient Medications     sodium chloride   Intravenous  Once   amLODipine  10 mg Oral Daily   aspirin  81 mg Oral Daily   hydrALAZINE  25 mg Oral Q8H   metoprolol tartrate  100 mg Oral BID   pravastatin  20 mg Oral q1800   sodium chloride flush  3 mL Intravenous Once    Family History    Family History  Problem Relation Age of Onset   Lung cancer Father    Hypertension Brother    He indicated that the status of his father is unknown. He indicated that the status of his brother is unknown.   Social History    Social History   Socioeconomic History   Marital status: Married    Spouse name: Not on file   Number of children: Not on file   Years of education: Not on file   Highest education level: Not on file  Occupational History   Not on file  Social Needs   Financial resource strain: Not on file   Food insecurity    Worry: Not on file    Inability: Not on file   Transportation needs    Medical: Not on file    Non-medical: Not on file  Tobacco Use   Smoking status: Former Smoker   Smokeless tobacco: Never Used  Substance and Sexual Activity   Alcohol use: Yes   Drug use: No   Sexual activity: Not on file  Lifestyle   Physical activity    Days per week: Not on file    Minutes per session: Not on file   Stress: Not on file  Relationships   Social connections    Talks on phone: Not on file    Gets together: Not on file    Attends religious service: Not on file    Active member of club or organization: Not on file    Attends meetings of clubs or organizations: Not on file    Relationship status: Not on file   Intimate partner violence    Fear of current or ex partner: Not on file    Emotionally abused: Not on file    Physically abused: Not on file    Forced sexual activity: Not on file  Other Topics Concern   Not on file  Social History Narrative   Not on file     Review of Systems    Review of Systems  Constitutional: Positive for malaise/fatigue. Negative for diaphoresis and  fever.  HENT: Positive for congestion.   Respiratory: Positive for shortness of breath. Negative for cough, hemoptysis and sputum production.   Cardiovascular: Positive for chest pain, palpitations and PND. Negative for orthopnea and leg swelling.  Gastrointestinal: Negative for blood in stool, melena, nausea and vomiting.  Genitourinary: Negative for hematuria.  Musculoskeletal: Negative for falls.  Neurological: Negative for dizziness and loss of consciousness.  Endo/Heme/Allergies: Does not bruise/bleed easily.  Psychiatric/Behavioral: Positive for substance abuse. Suicidal ideas: alcohol use and former tobacco use.    Physical Exam    Blood pressure 134/84, pulse 82, temperature 98.2 F (36.8 C), temperature source  Oral, resp. rate 18, height 5\' 11"  (1.803 m), weight 78.5 kg, SpO2 95 %.  General: 68 y.o. African-American male resting comfortably in no acute distress. Pleasant and cooperative. HEENT: Normocephalic and atraumatic. Sclera clear.  Neck: Supple. Prominent neck veins. Lungs: No increased work of breathing. Clear to auscultation bilaterally. No wheezes, rhonchi, or rales. Heart: RRR. Distinct S1 and S2. No significant murmurs, gallops, or rubs.  Abdomen: Soft, non-distended, and non-tender to palpation. Bowel sounds present. Extremities: No clubbing, cyanosis or edema. Radial  and distal pedal pulses 2+ and equal bilaterally. Skin: Warm and dry. Neuro: Alert and oriented x3. No focal deficits. Moves all extremities spontaneously. Psych: Normal affect. Responds appropriately.  Labs    Troponin (Point of Care Test) No results for input(s): TROPIPOC in the last 72 hours. No results for input(s): CKTOTAL, CKMB, TROPONINI in the last 72 hours. Lab Results  Component Value Date   WBC 6.4 04/19/2019   HGB 6.0 (LL) 04/19/2019   HCT 18.6 (L) 04/19/2019   MCV 89.4 04/19/2019   PLT 206 04/19/2019    Recent Labs  Lab 04/19/19 0033  NA 134*  K 3.4*  CL 103  CO2 14*    BUN 92*  CREATININE 9.72*  CALCIUM 8.2*  GLUCOSE 120*   Lab Results  Component Value Date   CHOL  11/28/2009    118        ATP III CLASSIFICATION:  <200     mg/dL   Desirable  200-239  mg/dL   Borderline High  >=240    mg/dL   High          TRIG 219 (H) 12/02/2009   No results found for: [facility]   Radiology Studies    Dg Chest 2 View  Result Date: 04/19/2019 CLINICAL DATA:  Chest pain EXAM: CHEST - 2 VIEW COMPARISON:  March 27, 2019 FINDINGS: The heart size and mediastinal contours are within normal limits. Both lungs are clear. The visualized skeletal structures are unremarkable. There is stable height loss of several thoracic vertebral bodies. IMPRESSION: No active cardiopulmonary disease. Electronically Signed   By: Constance Holster M.D.   On: 04/19/2019 01:45   US Renal  Result Date: 04/19/2019 CLINICAL DATA:  Hypertension, CKD stage 3 EXAM: RENAL / URINARY TRACT ULTRASOUND COMPLETE COMPARISON:  None. FINDINGS: Right Kidney: Renal measurements: 8.9 x 4.2 x 4.3 cm = volume: 84 mL. Cortex is diffusely echogenic. No mass or hydronephrosis visualized. Left Kidney: Renal measurements: 8 x 5 x 4.8 cm = volume: 100 mL. Cortex is diffusely echogenic. No mass or hydronephrosis visualized. Bladder: Appears normal for degree of bladder distention. No ureteral jets visualized. IMPRESSION: 1. Both kidneys are diffusely echogenic compatible with given history of chronic kidney disease. 2. No acute findings.  No hydronephrosis. Electronically Signed   By: Franki Cabot M.D.   On: 04/19/2019 07:12    EKG     EKG: EKG was personally reviewed and demonstrates: Normal sinus rhythm, rate 93 bpm, with LVH and early repolarization changes in the anterior leads, probable left atrial enlargement, poor R wave progression, and minimal <77mm ST depression in leads II, aVF, and V4-V6. No prior tracings available for comparison.   Telemetry: Telemetry was personally reviewed and demonstrates: Sinus rhythm  with rates in the high 70's to low 100's.  Cardiac Imaging    Echocardiogram 04/08/2019 [facility]): The left ventricular size is normal. Left ventricular systolic function is normal. LV ejection fraction = 55-60%. The right ventricular systolic function  is normal. The left atrium is mildly dilated. There is mild mitral regurgitation. The aortic sinus is normal size. IVC size was normal. There is no pericardial effusion. There is no comparison study available.  Assessment & Plan    Chest Pain Concerning for Unstable Angina - Patient presents with exertional chest pain over the last month that improves with rest and Nitroglycerin. Patient was seen by Dr. Maryan Rued at [facility] on 04/02/2019 and a stress test was ordered. Stress test scheduled but patient presented to the ED early this morning after an episode of chest pain at rest. - EKG shows normal sinus rhythm with LVH and early repolarization changes in the anterior leads, probable left atrial enlargement, poor R wave progression, and minimal <89mm ST depression in leads II, aVF, and V4-V6. No prior tracings available for comparison. - Initial high-sensitivity troponin negative in the ED. Continue to trend.  - Recent Echo from 04/08/2019 at [facility] showed LVEF of 55-60% mildly dilated left atrium and mild mitral regurgitation.  - Patient is currently chest pain free.  - Presentation concerning for unstable angina. Patient would ideally undergo cardiac catheterization; however, creatinine 9.72 on admission so cardiac catheterization not an option at this time. Per Dr. Maryan Rued note on 04/02/2019, creatinine was 2.5. Patient also had hemoglobin of 6.0 on admission. Even if we were to proceed with stress test, we could not perform intervention with current renal function. Will continue to treat medically at this time. Patient on low dose Aspirin (no signs of active bleeding), beta-blocker, and statin. MD to see.  Acute Anemia - Hemoglobin  6.0 on presentation. Last hemoglobin in our system from in 01/2010 was 11.1.  - Hemoccult negative. Patient denies any abnormal bleeding including hematochezia, melena, hematuria, and hemoptysis.  - Iron panel, Reticulocytes, Folate, and Vitamin B12 pending. - Patient received 1 unit of PRBCs this morning. - Management per primary team.  Acute Renal Failure Superimposed on CKD Stage III - Creatinine 9.72 on admission. Last creatinine in our system was 1.90 in 01/2010. Per Per Dr. Maryan Rued note on 04/02/2019, creatinine was 2.5. He was previously seen by Dr. Florene Glen at [facility] but has not been seen in over a year. He states he has a visit scheduled for early July. - Continue to avoid Nephrotoxic medications. - Primary team has consulted Nephrology. - Management per Nephrology.  Hypertension - BP relatively well controlled. Systolic BP in the 277'A to 140's.  - Home Losartan held on admission due to renal function. - Continue home Amlodipine 10mg  daily and Lopressor 100mg  twice daily.  - Primary team also added Hydralazine 25mg  three times daily for additional control.  Hyperlipidemia - Lipid panel pending. - Patient on Pravastatin 20mg  daily at home. Will go ahead and switch to Lipitor 40mg  daily in favor of high-intensity statin.  Otherwise, per primary team.   Signed, Darreld Mclean, PA-C 04/19/2019, 7:53 AM Pager: [phone] For questions or updates, please contact   Please consult [email or web address] for contact info under Cardiology/STEMI.  Attending note  Patient seen and discussed with PA Sarajane Jews, I agree with her documentation above. 68 yo male history of CKD, HTN, HL admitted with chest pain.    Followed at [facility]. Consult dated 04/02/2019 at [facility] seen for chest pain suggestive of angina.  03/2019 echo [facility]: LVEF 55-60%, no significant pathology -notes mention plans for nuclear stress test but I don't see where it has been completed    K 3.4 Cr 9.72 BUN 92 WBC  6.4  Hgb 6 Plt 206  UDS neg hstrop 12 -->40--> EKG SR, without acute ischemic changes CXR no acute process 03/2019 echo [facility]: LVEF 55-60%, no significant pathology  Presents with chest pain. He has severe renal failure, Cr 9.7. Looks like last Cr 11/2016 was 1.57 and severe anemia Hgb of 6 on admission. EKG without acute ischemic changes, fairly benign hstrop particularly when you consider the degree of renal failure and anemia, ACS is unlikely. Could have some chronic disease exacerbated by severe anemia and limited supply.  With renal failure and anemia really nothing diagnostic or intervention wise we could do. He was to have an outpatient nuclear stress at [facility]. I would optizimize his Hgb to 9-10 and follow symptoms, further workup of renal failure by primary team and nephrology. Can call us back if acute cardiac change this admission, otherwise would follow up with his [facility] cardiologist.   We will sign off inpatient care.    Carlyle Dolly MD

## 2019-04-19 NOTE — ED Notes (Signed)
ED TO INPATIENT HANDOFF REPORT  ED Nurse Name and Phone #: [phone] Name/Age/Gender [place] 68 y.o. male Room/Bed: 027C/027C  Code Status   Code Status: Not on file  Home/SNF/Other Home Patient oriented to: self, place, time and situation Is this baseline? Yes   Triage Complete: Triage complete  Chief Complaint Chest Pain   Triage Note Pt presents to ED with complaint of chest pain. Pt reports pt began early in the day and worsened through out. Pt states pain is centrally located and describes it as a pressure like sensation. He reports associated shortness of breath.  Pt reports taking nitro x3 and 324 ASA prior to arrival. Pain improves with nitro. VSS. Respirations even and unlabored.   Allergies Allergies  Allergen Reactions  . Enalapril Swelling    ANGIOEDEMA of lips ANGIOEDEMA of lips ANGIOEDEMA of lips ANGIOEDEMA of lips    Level of Care/Admitting Diagnosis ED Disposition    ED Disposition Condition Comment   Admit  Hospital Area: [facility] [[id number]]  Level of Care: Telemetry Cardiac [103]  I expect the patient will be discharged within 24 hours: No (not a candidate for 5C-Observation unit)  Covid Evaluation: Screening Protocol (No Symptoms)  Diagnosis: Chest pain [376283]  Admitting Physician: Ivor Costa [[id number]]  Attending Physician: Ivor Costa [[id number]]  PT Class (Do Not Modify): Observation [104]  PT Acc Code (Do Not Modify): Observation [10022]       B Medical/Surgery History Past Medical History:  Diagnosis Date  . CKD (chronic kidney disease), stage III ([place])   . HLD (hyperlipidemia)   . Hypertension    Past Surgical History:  Procedure Laterality Date  . HERNIA REPAIR       A IV Location/Drains/Wounds Patient Lines/Drains/Airways Status   Active Line/Drains/Airways    Name:   Placement date:   Placement time:   Site:   Days:   Peripheral IV 04/19/19 Left Arm   04/19/19    0036    Arm   less than 1           Intake/Output Last 24 hours No intake or output data in the 24 hours ending 04/19/19 0344  Labs/Imaging Results for orders placed or performed during the hospital encounter of 04/19/19 (from the past 48 hour(s))  Basic metabolic panel     Status: Abnormal   Collection Time: 04/19/19 12:33 AM  Result Value Ref Range   Sodium 134 (L) 135 - 145 mmol/L   Potassium 3.4 (L) 3.5 - 5.1 mmol/L   Chloride 103 98 - 111 mmol/L   CO2 14 (L) 22 - 32 mmol/L   Glucose, Bld 120 (H) 70 - 99 mg/dL   BUN 92 (H) 8 - 23 mg/dL   Creatinine, Ser 9.72 (H) 0.61 - 1.24 mg/dL   Calcium 8.2 (L) 8.9 - 10.3 mg/dL   GFR calc non Af Amer 5 (L) >60 mL/min   GFR calc Af Amer 6 (L) >60 mL/min   Anion gap 17 (H) 5 - 15    Comment: Performed at [facility], [address], [place], [place] [address]  CBC     Status: Abnormal   Collection Time: 04/19/19 12:33 AM  Result Value Ref Range   WBC 6.4 4.0 - 10.5 K/uL   RBC 2.08 (L) 4.22 - 5.81 MIL/uL   Hemoglobin 6.0 (LL) 13.0 - 17.0 g/dL    Comment: REPEATED TO VERIFY THIS CRITICAL RESULT HAS VERIFIED AND BEEN CALLED TO Kisha Messman  GUSMAN,RN BY ZELDA BEECH ON 06 28 2020 AT 0103, AND HAS BEEN READ BACK.     HCT 18.6 (L) 39.0 - 52.0 %   MCV 89.4 80.0 - 100.0 fL   MCH 28.8 26.0 - 34.0 pg   MCHC 32.3 30.0 - 36.0 g/dL   RDW 12.7 11.5 - 15.5 %   Platelets 206 150 - 400 K/uL   nRBC 0.0 0.0 - 0.2 %    Comment: Performed at [facility], [place] [address] [place], [place] [address]  Troponin I (High Sensitivity)     Status: None   Collection Time: 04/19/19 12:33 AM  Result Value Ref Range   Troponin I (High Sensitivity) 12 <18 ng/L    Comment: (NOTE) Elevated high sensitivity troponin I (hsTnI) values and significant  changes across serial measurements may suggest ACS but many other  chronic and acute conditions are known to elevate hsTnI results.  Refer to the "Links" section for chest pain algorithms and additional  guidance. Performed at [facility], [place] [address], [place], [place] [address]   Protime-INR     Status: None   Collection Time: 04/19/19 12:33 AM  Result Value Ref Range   Prothrombin Time 13.4 11.4 - 15.2 seconds   INR 1.0 0.8 - 1.2    Comment: (NOTE) INR goal varies based on device and disease states. Performed at [facility], [place] [address], [place], [place] [address]   Type and screen [place]     Status: None (Preliminary result)   Collection Time: 04/19/19  1:14 AM  Result Value Ref Range   ABO/RH(D) O POS    Antibody Screen NEG    Sample Expiration 04/22/2019,2359    Unit Number [id number]    Blood Component Type RED CELLS,LR    Unit division 00    Status of Unit ISSUED    Transfusion Status OK TO TRANSFUSE    Crossmatch Result      Compatible Performed at [facility], [place] [address] [place], [place] [address]   Prepare RBC     Status: None   Collection Time: 04/19/19  1:14 AM  Result Value Ref Range   Order Confirmation      ORDER PROCESSED BY BLOOD BANK Performed at [facility], [place] [address], [place], [place] [address]   POC occult blood, ED     Status: None   Collection Time: 04/19/19  1:56 AM  Result Value Ref Range   Fecal Occult Bld NEGATIVE NEGATIVE   Dg Chest 2 View  Result Date: 04/19/2019 CLINICAL DATA:  Chest pain EXAM: CHEST - 2 VIEW COMPARISON:  March 27, 2019 FINDINGS: The heart size and mediastinal contours are within normal limits. Both lungs are clear. The visualized skeletal structures are unremarkable. There is stable height loss of several thoracic vertebral bodies. IMPRESSION: No active cardiopulmonary disease. Electronically Signed   By: Constance Holster M.D.   On: 04/19/2019 01:45    Pending Labs Unresulted Labs (From admission, onward)    Start     Ordered   04/19/19 0500  Magnesium  Tomorrow morning,   R     04/19/19 0303   04/19/19 0500  Hemoglobin A1c  Tomorrow morning,   R     04/19/19 0303   04/19/19 0500   Lipid panel  Tomorrow morning,   R    Comments: Please obtain as a fasting lipid panel - should not have eaten/  drank food for 8 hours prior to labs.    04/19/19 0303   04/19/19 0303  Rapid urine drug screen (hospital performed)  Once,   STAT     04/19/19 0303   04/19/19 0303  Troponin I (High Sensitivity)  STAT Now then every 2 hours,   R (with STAT occurrences)    Question:  Indication  Answer:  Suspect ACS   04/19/19 0303   04/19/19 0302  Protein electrophoresis, serum  Once,   STAT     04/19/19 0303   04/19/19 0302  Creatinine, urine, random  Once,   STAT     04/19/19 0303   04/19/19 0302  Urea nitrogen, urine  Once,   STAT     04/19/19 0303   04/19/19 0301  Vitamin B12  (Anemia Panel (PNL))  Once,   STAT     04/19/19 0303   04/19/19 0301  Folate  (Anemia Panel (PNL))  Once,   STAT     04/19/19 0303   04/19/19 0301  Iron and TIBC  (Anemia Panel (PNL))  Once,   STAT     04/19/19 0303   04/19/19 0301  Ferritin  (Anemia Panel (PNL))  Once,   STAT     04/19/19 0303   04/19/19 0301  Reticulocytes  (Anemia Panel (PNL))  Once,   STAT     04/19/19 0303   04/19/19 0135  Novel Coronavirus,NAA,(SEND-OUT TO REF LAB - TAT 24-48 hrs); Hosp Order  (Asymptomatic Patients Labs)  ONCE - STAT,   STAT    Question:  Rule Out  Answer:  Yes   04/19/19 0134   Signed and Held  Urinalysis, Routine w reflex microscopic  Once,   R     Signed and Held   Signed and Held  HIV antibody (Routine Testing)  Once,   R     Signed and Held          Vitals/Pain Today's Vitals   04/19/19 0251 04/19/19 0257 04/19/19 0306 04/19/19 0315  BP: 137/82  (!) 145/88 (!) 145/82  Pulse: 93  97 95  Resp: 16  15 19   Temp: 98.6 F (37 C)  98.4 F (36.9 C) 98.6 F (37 C)  TempSrc: Oral  Oral   SpO2: 98%  98% 99%  Weight:      Height:      PainSc:  1       Isolation Precautions No active isolations  Medications Medications  sodium chloride flush (NS) 0.9 % injection 3 mL (has no administration in time range)   morphine 2 MG/ML injection 2 mg (has no administration in time range)  nitroGLYCERIN (NITROSTAT) SL tablet 0.4 mg (has no administration in time range)  aspirin chewable tablet 81 mg (has no administration in time range)  amLODipine (NORVASC) tablet 10 mg (has no administration in time range)  metoprolol tartrate (LOPRESSOR) tablet 100 mg (has no administration in time range)  pravastatin (PRAVACHOL) tablet 20 mg (has no administration in time range)  hydrALAZINE (APRESOLINE) tablet 25 mg (has no administration in time range)  morphine 4 MG/ML injection 4 mg (4 mg Intravenous Given 04/19/19 0129)  ondansetron (ZOFRAN) injection 4 mg (4 mg Intravenous Given 04/19/19 0127)  0.9 %  sodium chloride infusion (10 mL/hr Intravenous New Bag/Given 04/19/19 0132)    Mobility walks Low fall risk   Focused Assessments .   R Recommendations: See Admitting Provider Note  Report given to:   Additional Notes:

## 2019-04-20 ENCOUNTER — Observation Stay (HOSPITAL_COMMUNITY): Payer: Medicare Other

## 2019-04-20 DIAGNOSIS — E876 Hypokalemia: Secondary | ICD-10-CM

## 2019-04-20 DIAGNOSIS — I200 Unstable angina: Secondary | ICD-10-CM | POA: Diagnosis not present

## 2019-04-20 DIAGNOSIS — E785 Hyperlipidemia, unspecified: Secondary | ICD-10-CM | POA: Diagnosis not present

## 2019-04-20 DIAGNOSIS — N186 End stage renal disease: Secondary | ICD-10-CM | POA: Diagnosis not present

## 2019-04-20 DIAGNOSIS — Z888 Allergy status to other drugs, medicaments and biological substances status: Secondary | ICD-10-CM | POA: Diagnosis not present

## 2019-04-20 DIAGNOSIS — Z7982 Long term (current) use of aspirin: Secondary | ICD-10-CM | POA: Diagnosis not present

## 2019-04-20 DIAGNOSIS — I361 Nonrheumatic tricuspid (valve) insufficiency: Secondary | ICD-10-CM | POA: Diagnosis not present

## 2019-04-20 DIAGNOSIS — R809 Proteinuria, unspecified: Secondary | ICD-10-CM | POA: Diagnosis not present

## 2019-04-20 DIAGNOSIS — E8889 Other specified metabolic disorders: Secondary | ICD-10-CM | POA: Diagnosis present

## 2019-04-20 DIAGNOSIS — Z1159 Encounter for screening for other viral diseases: Secondary | ICD-10-CM | POA: Diagnosis not present

## 2019-04-20 DIAGNOSIS — I129 Hypertensive chronic kidney disease with stage 1 through stage 4 chronic kidney disease, or unspecified chronic kidney disease: Secondary | ICD-10-CM | POA: Diagnosis not present

## 2019-04-20 DIAGNOSIS — R079 Chest pain, unspecified: Secondary | ICD-10-CM | POA: Diagnosis not present

## 2019-04-20 DIAGNOSIS — N183 Chronic kidney disease, stage 3 (moderate): Secondary | ICD-10-CM | POA: Diagnosis not present

## 2019-04-20 DIAGNOSIS — Z8249 Family history of ischemic heart disease and other diseases of the circulatory system: Secondary | ICD-10-CM | POA: Diagnosis not present

## 2019-04-20 DIAGNOSIS — E872 Acidosis: Secondary | ICD-10-CM | POA: Diagnosis not present

## 2019-04-20 DIAGNOSIS — D649 Anemia, unspecified: Secondary | ICD-10-CM | POA: Diagnosis not present

## 2019-04-20 DIAGNOSIS — N179 Acute kidney failure, unspecified: Secondary | ICD-10-CM | POA: Diagnosis not present

## 2019-04-20 DIAGNOSIS — N184 Chronic kidney disease, stage 4 (severe): Secondary | ICD-10-CM | POA: Diagnosis not present

## 2019-04-20 DIAGNOSIS — D631 Anemia in chronic kidney disease: Secondary | ICD-10-CM | POA: Diagnosis not present

## 2019-04-20 DIAGNOSIS — Z801 Family history of malignant neoplasm of trachea, bronchus and lung: Secondary | ICD-10-CM | POA: Diagnosis not present

## 2019-04-20 DIAGNOSIS — I340 Nonrheumatic mitral (valve) insufficiency: Secondary | ICD-10-CM

## 2019-04-20 DIAGNOSIS — Z79899 Other long term (current) drug therapy: Secondary | ICD-10-CM | POA: Diagnosis not present

## 2019-04-20 DIAGNOSIS — Z87891 Personal history of nicotine dependence: Secondary | ICD-10-CM | POA: Diagnosis not present

## 2019-04-20 DIAGNOSIS — R600 Localized edema: Secondary | ICD-10-CM | POA: Diagnosis not present

## 2019-04-20 LAB — CBC
HCT: 23.7 % — ABNORMAL LOW (ref 39.0–52.0)
Hemoglobin: 8.1 g/dL — ABNORMAL LOW (ref 13.0–17.0)
MCH: 29.5 pg (ref 26.0–34.0)
MCHC: 34.2 g/dL (ref 30.0–36.0)
MCV: 86.2 fL (ref 80.0–100.0)
Platelets: 208 10*3/uL (ref 150–400)
RBC: 2.75 MIL/uL — ABNORMAL LOW (ref 4.22–5.81)
RDW: 12.6 % (ref 11.5–15.5)
WBC: 6.2 10*3/uL (ref 4.0–10.5)
nRBC: 0 % (ref 0.0–0.2)

## 2019-04-20 LAB — BASIC METABOLIC PANEL
Anion gap: 13 (ref 5–15)
BUN: 90 mg/dL — ABNORMAL HIGH (ref 8–23)
CO2: 17 mmol/L — ABNORMAL LOW (ref 22–32)
Calcium: 8.4 mg/dL — ABNORMAL LOW (ref 8.9–10.3)
Chloride: 107 mmol/L (ref 98–111)
Creatinine, Ser: 9.36 mg/dL — ABNORMAL HIGH (ref 0.61–1.24)
GFR calc Af Amer: 6 mL/min — ABNORMAL LOW (ref >60)
GFR calc non Af Amer: 5 mL/min — ABNORMAL LOW (ref >60)
Glucose, Bld: 88 mg/dL (ref 70–99)
Potassium: 3.8 mmol/L (ref 3.5–5.1)
Sodium: 137 mmol/L (ref 135–145)

## 2019-04-20 LAB — TYPE AND SCREEN
ABO/RH(D): O POS
Antibody Screen: NEGATIVE
Unit division: 0
Unit division: 0

## 2019-04-20 LAB — BPAM RBC
Blood Product Expiration Date: 202007012359
Blood Product Expiration Date: 202007262359
ISSUE DATE / TIME: 202006280236
ISSUE DATE / TIME: 202006281056
Unit Type and Rh: 5100
Unit Type and Rh: 5100

## 2019-04-20 LAB — HIV ANTIBODY (ROUTINE TESTING W REFLEX): HIV Screen 4th Generation wRfx: NONREACTIVE

## 2019-04-20 LAB — ECHOCARDIOGRAM COMPLETE
Height: 71 in
Weight: 2734.4 oz

## 2019-04-20 LAB — UREA NITROGEN, URINE: Urea Nitrogen, Ur: 410 mg/dL

## 2019-04-20 MED ORDER — DARBEPOETIN ALFA 100 MCG/0.5ML IJ SOSY
100.0000 ug | PREFILLED_SYRINGE | Freq: Once | INTRAMUSCULAR | Status: AC
  Administered 2019-04-20: 100 ug via SUBCUTANEOUS
  Filled 2019-04-20: qty 0.5

## 2019-04-20 MED ORDER — ZOLPIDEM TARTRATE 5 MG PO TABS
5.0000 mg | ORAL_TABLET | Freq: Every evening | ORAL | Status: DC | PRN
  Administered 2019-04-20 – 2019-04-21 (×2): 5 mg via ORAL
  Filled 2019-04-20 (×2): qty 1

## 2019-04-20 NOTE — Progress Notes (Addendum)
PROGRESS NOTE    Jesus Ayala  GGY:[id number] DOB: July 11, 1951 DOA: 04/19/2019 PCP: Rudy Jew  Brief Narrative: 68 year old male with history of hypertension, stage III-IV kidney disease, dyslipidemia Presented to the emergency room with chest pain ongoing off-and-on with exertion for 2 weeks. -He was sent to see a cardiologist in [facility] who had ordered a stress test on the patient which was supposed to be done tomorrow, Presented to the emergency room with chest pain ongoing off-and-on with exertion for 2 weeks, and had an echocardiogram done on 6/17 in [facility] which showed EF of 55%, mildly dilated left atrium and mild MR was prescribed sublingual nitroglycerin and he had been using this for exertional chest pain with relief -Presented to the emergency room he EKG showed ST depressions in multiple leads, LVH, troponin was negative, chest x-ray was unremarkable, labs noted hemoglobin of 6.0, creatinine of 9.7  Assessment & Plan:   Chest pain concerning for unstable angina -Troponin minimally elevated, no ACS -Recent echocardiogram at outside hospital noted normal EF -EKG shows LVH with repolarization abnormalities, ST depression in inferior leads -Cardiology consulted, recommended medical management considering severe AKI and anemia not appropriate for heart catheterization at this time  Acute normocytic anemia -No recent labs in our system, last CBC from 2011 ranged from 10-11 -No overt bleeding, Hemoccult negative, normal MCV and RDW  -Anemia panel suggestive of chronic disease, likely from CKD4 -Status post 2 units of PRBC, hemoglobin appropriately improved to 8 g/dL, nephrology following, will benefit from EPO  Acute kidney injury on stage IV CKD -Recent baseline creatinine was 2.5 per [place] MD notes, on admission creatinine was 9.7, with metabolic acidosis -Was on ARB and Lasix at baseline which were held, denies NSAID use -Has trace edema but his lungs are  clear -Nephrology consulted, given high dose IV lasix x1, urine output is 2000 last 24 hours, no indication for acute hemodialysis yet, but will need this in the near future  Hypertension -Stable, losartan held, continue amlodipine and Lopressor  Dyslipidemia -Started on Lipitor  Hypokalemia -Replace  DVT prophylaxis:  subcutaneous heparin Code Status: Full code Family Communication: No family at bedside Disposition Plan: Home pending improvement in AKI, anemia  Consultants:   Cardiology  Renal   Procedures:   Antimicrobials:    Subjective: -Feels better today, more energy after blood transfusion yesterday  Objective: Vitals:   04/20/19 0140 04/20/19 0528 04/20/19 0655 04/20/19 0904  BP: (!) 143/82 134/82  (!) 153/82  Pulse: 80 80  81  Resp: 20 20    Temp: 98.5 F (36.9 C) 98.2 F (36.8 C)  98.4 F (36.9 C)  TempSrc: Oral Oral  Oral  SpO2: 99% 98%    Weight:   77.5 kg   Height:        Intake/Output Summary (Last 24 hours) at 04/20/2019 1048 Last data filed at 04/20/2019 0600 Gross per 24 hour  Intake 1314 ml  Output 1650 ml  Net -336 ml   Filed Weights   04/19/19 0024 04/19/19 0415 04/20/19 0655  Weight: 76.7 kg 78.5 kg 77.5 kg    Examination:  Gen: Awake, Alert, Oriented X 3,  HEENT: PERRLA, Neck supple, no JVD Lungs: Clear bilaterally CVS: RRR,No Gallops,Rubs or new Murmurs Abd: soft, Non tender, non distended, BS present Extremities: Trace edema Skin: no new rashes Psychiatry: Judgement and insight appear normal. Mood & affect appropriate.     Data Reviewed:   CBC: Recent Labs  Lab 04/19/19  0033 04/19/19 1813 04/20/19 0548  WBC 6.4  --  6.2  HGB 6.0* 8.2* 8.1*  HCT 18.6* 23.8* 23.7*  MCV 89.4  --  86.2  PLT 206  --  163   Basic Metabolic Panel: Recent Labs  Lab 04/19/19 0033 04/19/19 0848 04/19/19 1813 04/20/19 0548  NA 134*  --  136 137  K 3.4*  --  3.6 3.8  CL 103  --  107 107  CO2 14*  --  18* 17*  GLUCOSE 120*  --   131* 88  BUN 92*  --  91* 90*  CREATININE 9.72*  --  9.55* 9.36*  CALCIUM 8.2*  --  8.4* 8.4*  MG  --  1.9  --   --    GFR: Estimated Creatinine Clearance: 8.3 mL/min (A) (by C-G formula based on SCr of 9.36 mg/dL (H)). Liver Function Tests: No results for input(s): AST, ALT, ALKPHOS, BILITOT, PROT, ALBUMIN in the last 168 hours. No results for input(s): LIPASE, AMYLASE in the last 168 hours. No results for input(s): AMMONIA in the last 168 hours. Coagulation Profile: Recent Labs  Lab 04/19/19 0033  INR 1.0   Cardiac Enzymes: No results for input(s): CKTOTAL, CKMB, CKMBINDEX, TROPONINI in the last 168 hours. BNP (last 3 results) No results for input(s): PROBNP in the last 8760 hours. HbA1C: Recent Labs    04/19/19 0848  HGBA1C 4.8   CBG: No results for input(s): GLUCAP in the last 168 hours. Lipid Profile: Recent Labs    04/19/19 0848  CHOL 89  HDL 47  LDLCALC 35  TRIG 36  CHOLHDL 1.9   Thyroid Function Tests: No results for input(s): TSH, T4TOTAL, FREET4, T3FREE, THYROIDAB in the last 72 hours. Anemia Panel: Recent Labs    04/19/19 0848  VITAMINB12 254  FOLATE 6.8  FERRITIN 328  TIBC 258  IRON 175  RETICCTPCT 1.4   Urine analysis:    Component Value Date/Time   COLORURINE STRAW (A) 04/19/2019 0505   APPEARANCEUR CLEAR 04/19/2019 0505   LABSPEC 1.010 04/19/2019 0505   PHURINE 5.0 04/19/2019 0505   GLUCOSEU NEGATIVE 04/19/2019 0505   HGBUR MODERATE (A) 04/19/2019 0505   BILIRUBINUR NEGATIVE 04/19/2019 0505   KETONESUR NEGATIVE 04/19/2019 0505   PROTEINUR 100 (A) 04/19/2019 0505   UROBILINOGEN 1.0 12/12/2009 0201   NITRITE NEGATIVE 04/19/2019 0505   LEUKOCYTESUR NEGATIVE 04/19/2019 0505   Sepsis Labs: @LABRCNTIP (procalcitonin:4,lacticidven:4)  )No results found for this or any previous visit (from the past 240 hour(s)).       Radiology Studies: Dg Chest 2 View  Result Date: 04/19/2019 CLINICAL DATA:  Chest pain EXAM: CHEST - 2 VIEW  COMPARISON:  March 27, 2019 FINDINGS: The heart size and mediastinal contours are within normal limits. Both lungs are clear. The visualized skeletal structures are unremarkable. There is stable height loss of several thoracic vertebral bodies. IMPRESSION: No active cardiopulmonary disease. Electronically Signed   By: Constance Holster M.D.   On: 04/19/2019 01:45   US Renal  Result Date: 04/19/2019 CLINICAL DATA:  Hypertension, CKD stage 3 EXAM: RENAL / URINARY TRACT ULTRASOUND COMPLETE COMPARISON:  None. FINDINGS: Right Kidney: Renal measurements: 8.9 x 4.2 x 4.3 cm = volume: 84 mL. Cortex is diffusely echogenic. No mass or hydronephrosis visualized. Left Kidney: Renal measurements: 8 x 5 x 4.8 cm = volume: 100 mL. Cortex is diffusely echogenic. No mass or hydronephrosis visualized. Bladder: Appears normal for degree of bladder distention. No ureteral jets visualized. IMPRESSION: 1. Both kidneys are  diffusely echogenic compatible with given history of chronic kidney disease. 2. No acute findings.  No hydronephrosis. Electronically Signed   By: Franki Cabot M.D.   On: 04/19/2019 07:12        Scheduled Meds: . amLODipine  10 mg Oral Daily  . aspirin  81 mg Oral Daily  . atorvastatin  40 mg Oral q1800  . furosemide  80 mg Intravenous Q12H  . heparin injection (subcutaneous)  5,000 Units Subcutaneous Q8H  . hydrALAZINE  25 mg Oral Q8H  . metoprolol tartrate  100 mg Oral BID  . sodium chloride flush  3 mL Intravenous Once   Continuous Infusions:   LOS: 0 days    Time spent: 85min    Domenic Polite, MD [facility] Page via [email or web address], password [email or web address] After 7PM please contact night-coverage  04/20/2019, 10:48 AM

## 2019-04-20 NOTE — Progress Notes (Signed)
[facility] NEPHROLOGY PROGRESS NOTE  Assessment/ Plan: Pt is a 68 y.o. yo male hypertension, hyperlipidemia, CKD used to follow with Dr. Florene Glen with last creatinine level of 2.5 in 02/2018, AKI in 2011 required brief dialysis, presented to with unstable angina and was seen by cardiologist.  We are consulted for AKI with creatinine level 9.72.  #AKI on CKD in the setting of CHF exacerbation/ARB use versus progressive CKD: Peaked creatinine level of 9.7 trending down to 9.3 today with IV Lasix.  Urine output 2 L in last 24 hours.  He has trace lower extremity edema and has no uremic symptoms.  Plan to continue IV Lasix today and monitor BMP daily.  Urinalysis chronic proteinuria but no cells.  Kidney US showed diffusely echogenic kidney consistent with CKD but no obstruction. -Order bladder scan -Monitor BMP and urine output.  I have discussed with the patient that if no improvement in renal function he will need dialysis treatment.  No urgent indication for dialysis today.  #Unstable angina: Seen by cardiology.  No chest pain today.  Follow-up echocardiogram.  #Hypertension: Pressure acceptable.  Discontinue losartan.  Continue current medication.  #Anemia of chronic kidney disease: Hemoglobin is 6 on admission received 2 unit of blood transfusion.  Iron saturation 68%.  Ordered a dose of Aranesp.  #Metabolic bone disease: Check phosphorus and PTH level.  Subjective: Seen and examined at bedside.  He was sitting on chair comfortable.  He reports feeling much better and asking when he can go home.  Denied chest pain, shortness of breath, nausea vomiting.  No urinary complaint. Objective Vital signs in last 24 hours: Vitals:   04/20/19 0528 04/20/19 0655 04/20/19 0904 04/20/19 1147  BP: 134/82  (!) 153/82 (!) 142/90  Pulse: 80  81 75  Resp: 20   18  Temp: 98.2 F (36.8 C)  98.4 F (36.9 C) 97.8 F (36.6 C)  TempSrc: Oral  Oral Oral  SpO2: 98%   97%  Weight:  77.5 kg     Height:       Weight change: 0.862 kg  Intake/Output Summary (Last 24 hours) at 04/20/2019 1221 Last data filed at 04/20/2019 0600 Gross per 24 hour  Intake 1314 ml  Output 1650 ml  Net -336 ml       Labs: Basic Metabolic Panel: Recent Labs  Lab 04/19/19 0033 04/19/19 1813 04/20/19 0548  NA 134* 136 137  K 3.4* 3.6 3.8  CL 103 107 107  CO2 14* 18* 17*  GLUCOSE 120* 131* 88  BUN 92* 91* 90*  CREATININE 9.72* 9.55* 9.36*  CALCIUM 8.2* 8.4* 8.4*   Liver Function Tests: No results for input(s): AST, ALT, ALKPHOS, BILITOT, PROT, ALBUMIN in the last 168 hours. No results for input(s): LIPASE, AMYLASE in the last 168 hours. No results for input(s): AMMONIA in the last 168 hours. CBC: Recent Labs  Lab 04/19/19 0033 04/19/19 1813 04/20/19 0548  WBC 6.4  --  6.2  HGB 6.0* 8.2* 8.1*  HCT 18.6* 23.8* 23.7*  MCV 89.4  --  86.2  PLT 206  --  208   Cardiac Enzymes: No results for input(s): CKTOTAL, CKMB, CKMBINDEX, TROPONINI in the last 168 hours. CBG: No results for input(s): GLUCAP in the last 168 hours.  Iron Studies:  Recent Labs    04/19/19 0848  IRON 175  TIBC 258  FERRITIN 328   Studies/Results: Dg Chest 2 View  Result Date: 04/19/2019 CLINICAL DATA:  Chest pain EXAM: CHEST - 2  VIEW COMPARISON:  March 27, 2019 FINDINGS: The heart size and mediastinal contours are within normal limits. Both lungs are clear. The visualized skeletal structures are unremarkable. There is stable height loss of several thoracic vertebral bodies. IMPRESSION: No active cardiopulmonary disease. Electronically Signed   By: Constance Holster M.D.   On: 04/19/2019 01:45   US Renal  Result Date: 04/19/2019 CLINICAL DATA:  Hypertension, CKD stage 3 EXAM: RENAL / URINARY TRACT ULTRASOUND COMPLETE COMPARISON:  None. FINDINGS: Right Kidney: Renal measurements: 8.9 x 4.2 x 4.3 cm = volume: 84 mL. Cortex is diffusely echogenic. No mass or hydronephrosis visualized. Left Kidney: Renal  measurements: 8 x 5 x 4.8 cm = volume: 100 mL. Cortex is diffusely echogenic. No mass or hydronephrosis visualized. Bladder: Appears normal for degree of bladder distention. No ureteral jets visualized. IMPRESSION: 1. Both kidneys are diffusely echogenic compatible with given history of chronic kidney disease. 2. No acute findings.  No hydronephrosis. Electronically Signed   By: Franki Cabot M.D.   On: 04/19/2019 07:12    Medications: Infusions:   Scheduled Medications: . amLODipine  10 mg Oral Daily  . aspirin  81 mg Oral Daily  . atorvastatin  40 mg Oral q1800  . darbepoetin (ARANESP) injection - NON-DIALYSIS  100 mcg Subcutaneous Once  . furosemide  80 mg Intravenous Q12H  . heparin injection (subcutaneous)  5,000 Units Subcutaneous Q8H  . hydrALAZINE  25 mg Oral Q8H  . metoprolol tartrate  100 mg Oral BID  . sodium chloride flush  3 mL Intravenous Once    have reviewed scheduled and prn medications.  Physical Exam: General:NAD, comfortable Heart:RRR, s1s2 nl, no rubs Lungs:clear b/l, no crackle Abdomen:soft, Non-tender, non-distended Extremities: Trace LE edema Neurology: Alert, awake, following commands, no asterixis   Tanna Furry 04/20/2019,12:21 PM  LOS: 0 days  Pager: [phone]

## 2019-04-20 NOTE — Progress Notes (Signed)
Pt requests med for sleep. MD paged.

## 2019-04-20 NOTE — Progress Notes (Signed)
  Echocardiogram 2D Echocardiogram has been performed.  Jesus Ayala 04/20/2019, 10:21 AM

## 2019-04-21 DIAGNOSIS — D649 Anemia, unspecified: Secondary | ICD-10-CM

## 2019-04-21 LAB — NOVEL CORONAVIRUS, NAA (HOSP ORDER, SEND-OUT TO REF LAB; TAT 18-24 HRS): SARS-CoV-2, NAA: NOT DETECTED

## 2019-04-21 LAB — PROTEIN ELECTROPHORESIS, SERUM
A/G Ratio: 1.2 (ref 0.7–1.7)
Albumin ELP: 3.7 g/dL (ref 2.9–4.4)
Alpha-1-Globulin: 0.3 g/dL (ref 0.0–0.4)
Alpha-2-Globulin: 0.9 g/dL (ref 0.4–1.0)
Beta Globulin: 0.9 g/dL (ref 0.7–1.3)
Gamma Globulin: 1.1 g/dL (ref 0.4–1.8)
Globulin, Total: 3.1 g/dL (ref 2.2–3.9)
Total Protein ELP: 6.8 g/dL (ref 6.0–8.5)

## 2019-04-21 LAB — RENAL FUNCTION PANEL
Albumin: 3.4 g/dL — ABNORMAL LOW (ref 3.5–5.0)
Anion gap: 14 (ref 5–15)
BUN: 94 mg/dL — ABNORMAL HIGH (ref 8–23)
CO2: 16 mmol/L — ABNORMAL LOW (ref 22–32)
Calcium: 8.9 mg/dL (ref 8.9–10.3)
Chloride: 107 mmol/L (ref 98–111)
Creatinine, Ser: 9.21 mg/dL — ABNORMAL HIGH (ref 0.61–1.24)
GFR calc Af Amer: 6 mL/min — ABNORMAL LOW (ref >60)
GFR calc non Af Amer: 5 mL/min — ABNORMAL LOW (ref >60)
Glucose, Bld: 92 mg/dL (ref 70–99)
Phosphorus: 6 mg/dL — ABNORMAL HIGH (ref 2.5–4.6)
Potassium: 3.6 mmol/L (ref 3.5–5.1)
Sodium: 137 mmol/L (ref 135–145)

## 2019-04-21 MED ORDER — SODIUM BICARBONATE 650 MG PO TABS
1300.0000 mg | ORAL_TABLET | Freq: Two times a day (BID) | ORAL | Status: DC
  Administered 2019-04-21 – 2019-04-22 (×3): 1300 mg via ORAL
  Filled 2019-04-21 (×3): qty 2

## 2019-04-21 MED ORDER — FUROSEMIDE 10 MG/ML IJ SOLN
80.0000 mg | Freq: Two times a day (BID) | INTRAMUSCULAR | Status: DC
  Administered 2019-04-21: 80 mg via INTRAVENOUS
  Filled 2019-04-21: qty 8

## 2019-04-21 MED ORDER — CALCIUM ACETATE (PHOS BINDER) 667 MG PO CAPS
667.0000 mg | ORAL_CAPSULE | Freq: Three times a day (TID) | ORAL | Status: DC
  Administered 2019-04-21 – 2019-04-22 (×4): 667 mg via ORAL
  Filled 2019-04-21 (×4): qty 1

## 2019-04-21 MED ORDER — FUROSEMIDE 80 MG PO TABS
80.0000 mg | ORAL_TABLET | Freq: Every day | ORAL | Status: DC
  Administered 2019-04-22: 80 mg via ORAL
  Filled 2019-04-21: qty 1

## 2019-04-21 NOTE — Progress Notes (Signed)
[facility] NEPHROLOGY PROGRESS NOTE  Assessment/ Plan: Pt is a 68 y.o. yo male hypertension, hyperlipidemia, CKD used to follow with Dr. Florene Glen with last creatinine level of 2.5 in 02/2018, AKI in 2011 required brief dialysis, presented to with unstable angina and was seen by cardiologist.  We are consulted for AKI with creatinine level 9.72.  #AKI on CKD in the setting of CHF exacerbation/ARB use versus progressive CKD: Peaked creatinine level of 9.7 trending down to 9.2 today with IV Lasix.  - Urine output 3.1 L in last 24 hours. The LE edema is improved therefore switch to oral Lasix today.  He is very comfortable without any uremic symptoms.   -I have discussed with him that he is nearing to require dialysis. He is very hesitant to start dialysis treatment but agreed for vein mapping.  He has follow-up planned with me at [facility] in the first week of July.  No urgent indication for dialysis today. -Urinalysis chronic proteinuria but no cells.  Kidney US showed diffusely echogenic kidney consistent with CKD but no obstruction.  #Metabolic acidosis: Start sodium bicarbonate.  #Unstable angina: Seen by cardiology.  No chest pain today.  Echo with EF of 60 to 65%.  #Hypertension: BP acceptable.  Discontinue losartan.  Continue current medication.  #Anemia of chronic kidney disease: Hemoglobin is 6 on admission received 2 unit of blood transfusion.  Iron saturation 68%.  Received a dose of Aranesp on 6/29.  #Metabolic bone disease: Phosphorus mildly elevated, follow-up PTH.  Start binders.  Discussed with primary team.  Subjective: Seen and examined at bedside.  He was sitting on chair comfortable.  He reports feeling good and has no complaint.  He is eager to go home.  Denies nausea, vomiting, loss of appetite, dysgeusia, chest pain, shortness of breath or weakness.  He does not want to initiate dialysis this time and wants to follow-up outpatient.  Objective Vital  signs in last 24 hours: Vitals:   04/20/19 2322 04/21/19 0509 04/21/19 0511 04/21/19 1001  BP: (!) 150/83 (!) 149/88  138/85  Pulse:  84  79  Resp:  19    Temp: 98.7 F (37.1 C) 98.6 F (37 C)  98.4 F (36.9 C)  TempSrc: Oral Oral  Oral  SpO2: 99% 98%  100%  Weight:   76.1 kg   Height:       Weight change: -1.406 kg  Intake/Output Summary (Last 24 hours) at 04/21/2019 1014 Last data filed at 04/21/2019 0946 Gross per 24 hour  Intake 1080 ml  Output 3150 ml  Net -2070 ml       Labs: Basic Metabolic Panel: Recent Labs  Lab 04/19/19 1813 04/20/19 0548 04/21/19 0646  NA 136 137 137  K 3.6 3.8 3.6  CL 107 107 107  CO2 18* 17* 16*  GLUCOSE 131* 88 92  BUN 91* 90* 94*  CREATININE 9.55* 9.36* 9.21*  CALCIUM 8.4* 8.4* 8.9  PHOS  --   --  6.0*   Liver Function Tests: Recent Labs  Lab 04/21/19 0646  ALBUMIN 3.4*   No results for input(s): LIPASE, AMYLASE in the last 168 hours. No results for input(s): AMMONIA in the last 168 hours. CBC: Recent Labs  Lab 04/19/19 0033 04/19/19 1813 04/20/19 0548  WBC 6.4  --  6.2  HGB 6.0* 8.2* 8.1*  HCT 18.6* 23.8* 23.7*  MCV 89.4  --  86.2  PLT 206  --  208   Cardiac Enzymes: No results for input(s):  CKTOTAL, CKMB, CKMBINDEX, TROPONINI in the last 168 hours. CBG: No results for input(s): GLUCAP in the last 168 hours.  Iron Studies:  Recent Labs    04/19/19 0848  IRON 175  TIBC 258  FERRITIN 328   Studies/Results: No results found.  Medications: Infusions:   Scheduled Medications: . amLODipine  10 mg Oral Daily  . aspirin  81 mg Oral Daily  . atorvastatin  40 mg Oral q1800  . [START ON 04/22/2019] furosemide  80 mg Oral Daily  . heparin injection (subcutaneous)  5,000 Units Subcutaneous Q8H  . hydrALAZINE  25 mg Oral Q8H  . metoprolol tartrate  100 mg Oral BID  . sodium bicarbonate  1,300 mg Oral BID  . sodium chloride flush  3 mL Intravenous Once    have reviewed scheduled and prn  medications.  Physical Exam: General: Sitting on chair comfortable, not in distress Heart:RRR, s1s2 nl, no rubs Lungs:clear b/l, no crackle Abdomen:soft, Non-tender, non-distended Extremities: No LE edema Neurology: Alert, awake, following commands, no asterixis  Dron Prasad Bhandari 04/21/2019,10:14 AM  LOS: 1 day  Pager: [phone]

## 2019-04-21 NOTE — Progress Notes (Signed)
PROGRESS NOTE    Jesus Ayala  KZS:[id number] DOB: Feb 09, 1951 DOA: 04/19/2019 PCP: Rudy Jew  Brief Narrative: 68 year old male with history of hypertension, stage III-IV kidney disease, dyslipidemia Presented to the emergency room with chest pain ongoing off-and-on with exertion for 2 weeks. -He was sent to see a cardiologist in [facility] who had ordered a stress test on the patient which was supposed to be done tomorrow, Presented to the emergency room with chest pain ongoing off-and-on with exertion for 2 weeks, and had an echocardiogram done on 6/17 in [facility] which showed EF of 55%, mildly dilated left atrium and mild MR was prescribed sublingual nitroglycerin and he had been using this for exertional chest pain with relief -Presented to the emergency room he EKG showed ST depressions in multiple leads, LVH, troponin was negative, chest x-ray was unremarkable, labs noted hemoglobin of 6.0, creatinine of 9.7  Assessment & Plan:   Chest pain concerning for unstable angina -Troponin minimally elevated, no ACS, felt to be due to demand from severe anemia -Recent echocardiogram at outside hospital noted normal EF -EKG shows LVH with repolarization abnormalities, ST depression in inferior leads -Cardiology consulted, recommended medical management considering severe AKI and anemia not appropriate for heart catheterization at this time  Acute normocytic anemia -No recent labs in our system, last CBC from 2011 ranged from 10-11 -No overt bleeding, Hemoccult negative, normal MCV and RDW  -Anemia panel suggestive of chronic disease, likely from CKD4 -Status post 2 units of PRBC, hemoglobin appropriately improved to 8 g/dL, nephrology following, given EPo 6/29  Acute kidney injury on stage IV CKD -Recent baseline creatinine was 2.5 per [place] MD notes, on admission creatinine was 9.7, with metabolic acidosis -Was on ARB and Lasix at baseline which were held, denies NSAID  use -Had trace edema but his lungs are clear -Nephrology consulted, given couple of doses of high dose IV lasix, volume status improved, no indication for HD yet, but will need this in the very near future, no uremia symptoms -Renal plans vein mapping today and referral to [facility] -Dietician consult for Renal diet education  Hypertension -Stable, losartan held, continue amlodipine and Lopressor  Dyslipidemia -Started on Lipitor  Hypokalemia -Replace  DVT prophylaxis:  subcutaneous heparin Code Status: Full code Family Communication: No family at bedside, will update wife today Disposition Plan: Home in 1-2days if stable  Consultants:   Cardiology  Renal   Procedures:   Antimicrobials:    Subjective: -feels ok, denies any dyspnea today, no N/V, no chest pain  Objective: Vitals:   04/20/19 2322 04/21/19 0509 04/21/19 0511 04/21/19 1001  BP: (!) 150/83 (!) 149/88  138/85  Pulse:  84  79  Resp:  19    Temp: 98.7 F (37.1 C) 98.6 F (37 C)  98.4 F (36.9 C)  TempSrc: Oral Oral  Oral  SpO2: 99% 98%  100%  Weight:   76.1 kg   Height:        Intake/Output Summary (Last 24 hours) at 04/21/2019 1110 Last data filed at 04/21/2019 1027 Gross per 24 hour  Intake 1080 ml  Output 3450 ml  Net -2370 ml   Filed Weights   04/19/19 0415 04/20/19 0655 04/21/19 0511  Weight: 78.5 kg 77.5 kg 76.1 kg    Examination:  Gen: Awake, Alert, Oriented X 3, no distress HEENT: PERRLA, Neck supple, no JVD Lungs: Clear today CVS: S1S2/RRR Abd: soft, Non tender, non distended, BS present Extremities: trace edema Skin:  no new rashes Psychiatry: Judgement and insight appear normal. Mood & affect appropriate.     Data Reviewed:   CBC: Recent Labs  Lab 04/19/19 0033 04/19/19 1813 04/20/19 0548  WBC 6.4  --  6.2  HGB 6.0* 8.2* 8.1*  HCT 18.6* 23.8* 23.7*  MCV 89.4  --  86.2  PLT 206  --  093   Basic Metabolic Panel: Recent Labs  Lab 04/19/19 0033 04/19/19 0848 04/19/19  1813 04/20/19 0548 04/21/19 0646  NA 134*  --  136 137 137  K 3.4*  --  3.6 3.8 3.6  CL 103  --  107 107 107  CO2 14*  --  18* 17* 16*  GLUCOSE 120*  --  131* 88 92  BUN 92*  --  91* 90* 94*  CREATININE 9.72*  --  9.55* 9.36* 9.21*  CALCIUM 8.2*  --  8.4* 8.4* 8.9  MG  --  1.9  --   --   --   PHOS  --   --   --   --  6.0*   GFR: Estimated Creatinine Clearance: 8.4 mL/min (A) (by C-G formula based on SCr of 9.21 mg/dL (H)). Liver Function Tests: Recent Labs  Lab 04/21/19 0646  ALBUMIN 3.4*   No results for input(s): LIPASE, AMYLASE in the last 168 hours. No results for input(s): AMMONIA in the last 168 hours. Coagulation Profile: Recent Labs  Lab 04/19/19 0033  INR 1.0   Cardiac Enzymes: No results for input(s): CKTOTAL, CKMB, CKMBINDEX, TROPONINI in the last 168 hours. BNP (last 3 results) No results for input(s): PROBNP in the last 8760 hours. HbA1C: Recent Labs    04/19/19 0848  HGBA1C 4.8   CBG: No results for input(s): GLUCAP in the last 168 hours. Lipid Profile: Recent Labs    04/19/19 0848  CHOL 89  HDL 47  LDLCALC 35  TRIG 36  CHOLHDL 1.9   Thyroid Function Tests: No results for input(s): TSH, T4TOTAL, FREET4, T3FREE, THYROIDAB in the last 72 hours. Anemia Panel: Recent Labs    04/19/19 0848  VITAMINB12 254  FOLATE 6.8  FERRITIN 328  TIBC 258  IRON 175  RETICCTPCT 1.4   Urine analysis:    Component Value Date/Time   COLORURINE STRAW (A) 04/19/2019 0505   APPEARANCEUR CLEAR 04/19/2019 0505   LABSPEC 1.010 04/19/2019 0505   PHURINE 5.0 04/19/2019 0505   GLUCOSEU NEGATIVE 04/19/2019 0505   HGBUR MODERATE (A) 04/19/2019 0505   BILIRUBINUR NEGATIVE 04/19/2019 0505   KETONESUR NEGATIVE 04/19/2019 0505   PROTEINUR 100 (A) 04/19/2019 0505   UROBILINOGEN 1.0 12/12/2009 0201   NITRITE NEGATIVE 04/19/2019 0505   LEUKOCYTESUR NEGATIVE 04/19/2019 0505   Sepsis Labs: @LABRCNTIP (procalcitonin:4,lacticidven:4)  ) Recent Results (from the  past 240 hour(s))  Novel Coronavirus,NAA,(SEND-OUT TO REF LAB - TAT 24-48 hrs); Hosp Order     Status: None   Collection Time: 04/19/19  2:05 AM   Specimen: Nasopharyngeal Swab; Respiratory  Result Value Ref Range Status   SARS-CoV-2, NAA NOT DETECTED NOT DETECTED Final    Comment: (NOTE) This test was developed and its performance characteristics determined by [employer]. This test has not been FDA cleared or approved. This test has been authorized by FDA under an Emergency Use Authorization (EUA). This test is only authorized for the duration of time the declaration that circumstances exist justifying the authorization of the emergency use of in vitro diagnostic tests for detection of SARS-CoV-2 virus and/or diagnosis of COVID-19 infection under section  564(b)(1) of the Act, 21 U.S.C. 657XUX-8(B)(3), unless the authorization is terminated or revoked sooner. When diagnostic testing is negative, the possibility of a false negative result should be considered in the context of a patient's recent exposures and the presence of clinical signs and symptoms consistent with COVID-19. An individual without symptoms of COVID-19 and who is not shedding SARS-CoV-2 virus would expect to have a negative (not detected) result in this assay. Performed  At: [facility] [address] [place], [place] [id number] Rush Farmer MD OM:[id number]    [place]  Final    Comment: Performed at [facility], [place] [address] [place], [place] [address]         Radiology Studies: No results found.      Scheduled Meds: . amLODipine  10 mg Oral Daily  . aspirin  81 mg Oral Daily  . atorvastatin  40 mg Oral q1800  . calcium acetate  667 mg Oral TID WC  . [START ON 04/22/2019] furosemide  80 mg Oral Daily  . heparin injection (subcutaneous)  5,000 Units Subcutaneous Q8H  . hydrALAZINE  25 mg Oral Q8H  . metoprolol tartrate  100 mg Oral BID  . sodium  bicarbonate  1,300 mg Oral BID  . sodium chloride flush  3 mL Intravenous Once   Continuous Infusions:   LOS: 1 day    Time spent: 38min    Domenic Polite, MD [facility] Page via [email or web address], password [email or web address] After 7PM please contact night-coverage  04/21/2019, 11:10 AM

## 2019-04-22 ENCOUNTER — Inpatient Hospital Stay (HOSPITAL_COMMUNITY): Payer: Medicare Other

## 2019-04-22 DIAGNOSIS — N186 End stage renal disease: Secondary | ICD-10-CM

## 2019-04-22 LAB — RENAL FUNCTION PANEL
Albumin: 3.3 g/dL — ABNORMAL LOW (ref 3.5–5.0)
Anion gap: 14 (ref 5–15)
BUN: 99 mg/dL — ABNORMAL HIGH (ref 8–23)
CO2: 18 mmol/L — ABNORMAL LOW (ref 22–32)
Calcium: 8.9 mg/dL (ref 8.9–10.3)
Chloride: 104 mmol/L (ref 98–111)
Creatinine, Ser: 9.39 mg/dL — ABNORMAL HIGH (ref 0.61–1.24)
GFR calc Af Amer: 6 mL/min — ABNORMAL LOW (ref >60)
GFR calc non Af Amer: 5 mL/min — ABNORMAL LOW (ref >60)
Glucose, Bld: 95 mg/dL (ref 70–99)
Phosphorus: 5.8 mg/dL — ABNORMAL HIGH (ref 2.5–4.6)
Potassium: 3.6 mmol/L (ref 3.5–5.1)
Sodium: 136 mmol/L (ref 135–145)

## 2019-04-22 LAB — CBC
HCT: 24.5 % — ABNORMAL LOW (ref 39.0–52.0)
Hemoglobin: 8.4 g/dL — ABNORMAL LOW (ref 13.0–17.0)
MCH: 29.5 pg (ref 26.0–34.0)
MCHC: 34.3 g/dL (ref 30.0–36.0)
MCV: 86 fL (ref 80.0–100.0)
Platelets: 213 10*3/uL (ref 150–400)
RBC: 2.85 MIL/uL — ABNORMAL LOW (ref 4.22–5.81)
RDW: 12.2 % (ref 11.5–15.5)
WBC: 5.8 10*3/uL (ref 4.0–10.5)
nRBC: 0 % (ref 0.0–0.2)

## 2019-04-22 LAB — PTH, INTACT AND CALCIUM
Calcium, Total (PTH): 8.7 mg/dL (ref 8.6–10.2)
PTH: 105 pg/mL — ABNORMAL HIGH (ref 15–65)

## 2019-04-22 MED ORDER — HYDRALAZINE HCL 25 MG PO TABS: 25.0000 mg | ORAL_TABLET | Freq: Three times a day (TID) | ORAL | 0 refills | Status: AC

## 2019-04-22 MED ORDER — CALCIUM ACETATE (PHOS BINDER) 667 MG PO CAPS: 667.0000 mg | ORAL_CAPSULE | Freq: Three times a day (TID) | ORAL | 0 refills | Status: AC

## 2019-04-22 MED ORDER — ATORVASTATIN CALCIUM 40 MG PO TABS: 40.0000 mg | ORAL_TABLET | Freq: Every day | ORAL | 0 refills | Status: DC

## 2019-04-22 MED ORDER — SODIUM BICARBONATE 650 MG PO TABS: 1300.0000 mg | ORAL_TABLET | Freq: Two times a day (BID) | ORAL | 0 refills | Status: DC

## 2019-04-22 NOTE — Progress Notes (Signed)
Upper extremity vein mapping has been completed.   Preliminary results in CV Proc.   Abram Sander 04/22/2019 10:53 AM

## 2019-04-22 NOTE — Plan of Care (Signed)
  Problem: Education: Goal: Knowledge of General Education information will improve Description Including pain rating scale, medication(s)/side effects and non-pharmacologic comfort measures Outcome: Progressing   

## 2019-04-22 NOTE — Discharge Instructions (Signed)
Food Basics for Chronic Kidney Disease °When your kidneys are not working well, they cannot remove waste and excess substances from your blood as effectively as they did before. This can lead to a buildup and imbalance of these substances, which can worsen kidney damage and affect how your body functions. Certain foods lead to a buildup of these substances in the body. By changing your diet as recommended by your diet and nutrition specialist (dietitian) or health care provider, you could help prevent further kidney damage and delay or prevent the need for dialysis. °What are tips for following this plan? °General instructions ° °· Work with your health care provider and dietitian to develop a meal plan that is right for you. Foods you can eat, limit, or avoid will be different for each person depending on the stage of kidney disease and any other existing health conditions. °· Talk with your health care provider about whether you should take a vitamin and mineral supplement. °· Use standard measuring cups and spoons to measure servings of foods. Use a kitchen scale to measure portions of protein foods. °· If directed by your health care provider, avoid drinking too much fluid. Measure and count all liquids, including water, ice, soups, flavored gelatin, and frozen desserts such as popsicles or ice cream. °Reading food labels °· Check the amount of sodium in foods. Choose foods that have less than 300 milligrams (mg) per serving. °· Check the ingredient list for phosphorus or potassium-based additives or preservatives. °· Check the amount of saturated and trans fat. Limit or avoid these fats as told by your dietitian. °Shopping °· Avoid buying foods that are: °? Processed, frozen, or prepackaged. °? Calcium-enriched or fortified. °· Do not buy foods that have salt or sodium listed among the first five ingredients. °· Do not buy canned vegetables. °Cooking °· Replace animal proteins, such as meat, fish, eggs, or  dairy, with plant proteins from beans, nuts, and soy. °? Use soy milk instead of cow's milk. °? Add beans or tofu to soups, casseroles, or pasta dishes instead of meat. °· Soak vegetables, such as potatoes, before cooking to reduce potassium. To do this: °? Peel and cut into small pieces. °? Soak in warm water for at least 2 hours. For every 1 cup of vegetables, use 10 cups of water. °? Drain and rinse with warm water. °? Boil for at least 5 minutes. °Meal planning °· Limit the amount of protein from plant and animal sources you eat each day. °· Do not add salt to food when cooking or before eating. °· Eat meals and snacks at around the same time each day. °If you have diabetes: °· If you have diabetes (diabetes mellitus) and chronic kidney disease, it is important to keep your blood glucose in the target range recommended by your health care provider. Follow your diabetes management plan. This may include: °? Checking your blood glucose regularly. °? Taking oral medicines, insulin, or both. °? Exercising for at least 30 minutes on 5 or more days each week, or as told by your health care provider. °? Tracking how many servings of carbohydrates you eat at each meal. °· You may be given specific guidelines on how much of certain foods and nutrients you may eat, depending on your stage of kidney disease and whether you have high blood pressure (hypertension). Follow your meal plan as told by your dietitian. °What nutrients should be limited? °The items listed are not a complete list. Talk with your dietitian   about what dietary choices are best for you. °Potassium °Potassium affects how steadily your heart beats. If too much potassium builds up in your blood, it can cause an irregular heartbeat or even a heart attack. °You may need to eat less potassium, depending on your blood potassium levels and the stage of kidney disease. Talk to your dietitian about how much potassium you may have each day. °You may need to limit  or avoid foods that are high in potassium, such as: °· Milk and soy milk. °· Fruits, such as bananas, papaya, apricots, nectarines, melon, prunes, raisins, kiwi, and oranges. °· Vegetables, such as potatoes, sweet potatoes, yams, tomatoes, leafy greens, beets, okra, avocado, pumpkin, and winter squash. °· White and lima beans. °Phosphorus °Phosphorus is a mineral found in your bones. A balance between calcium and phosphorous is needed to build and maintain healthy bones. Too much phosphorus pulls calcium from your bones. This can make your bones weak and more likely to break. Too much phosphorus can also make your skin itch. °You may need to eat less phosphorus depending on your blood phosphorus levels and the stage of kidney disease. Talk to your dietitian about how much potassium you may have each day. You may need to take medicine to lower your blood phosphorus levels if diet changes do not help. °You may need to limit or avoid foods that are high in phosphorus, such as: °· Milk and dairy products. °· Dried beans and peas. °· Tofu, soy milk, and other soy-based meat replacements. °· Colas. °· Nuts and peanut butter. °· Meat, poultry, and fish. °· Bran cereals and oatmeals. °Protein °Protein helps you to make and keep muscle. It also helps in the repair of your body’s cells and tissues. One of the natural breakdown products of protein is a waste product called urea. When your kidneys are not working properly, they cannot remove wastes, such as urea, like they did before you developed chronic kidney disease. Reducing how much protein you eat can help prevent a buildup of urea in your blood. °Depending on your stage of kidney disease, you may need to limit foods that are high in protein. Sources of animal protein include: °· Meat (all types). °· Fish and seafood. °· Poultry. °· Eggs. °· Dairy. °Other protein foods include: °· Beans and legumes. °· Nuts and nut butter. °· Soy and tofu. °Sodium °Sodium, which is found  in salt, helps maintain a healthy balance of fluids in your body. Too much sodium can increase your blood pressure and have a negative effect on the function of your heart and lungs. Too much sodium can also cause your body to retain too much fluid, making your kidneys work harder. °Most people should have less than 2,300 milligrams (mg) of sodium each day. If you have hypertension, you may need to limit your sodium to 1,500 mg each day. Talk to your dietitian about how much sodium you may have each day. °You may need to limit or avoid foods that are high in sodium, such as: °· Salt seasonings. °· Soy sauce. °· Cured and processed meats. °· Salted crackers and snack foods. °· Fast food. °· Canned soups and most canned foods. °· Pickled foods. °· Vegetable juice. °· Boxed mixes or ready-to-eat boxed meals and side dishes. °· Bottled dressings, sauces, and marinades. °Summary °· Chronic kidney disease can lead to a buildup and imbalance of waste and excess substances in the body. Certain foods lead to a buildup of these substances. By adjusting   your intake of these foods, you could help prevent more kidney damage and delay or prevent the need for dialysis. °· Food adjustments are different for each person with chronic kidney disease. Work with a dietitian to set up nutrient goals and a meal plan that is right for you. °· If you have diabetes and chronic kidney disease, it is important to keep your blood glucose in the target range recommended by your health care provider. °This information is not intended to replace advice given to you by your health care provider. Make sure you discuss any questions you have with your health care provider. °Document Released: 12/29/2002 Document Revised: 01/29/2019 Document Reviewed: 10/03/2016 °Elsevier Patient Education © 2020 [employer] ° °

## 2019-04-22 NOTE — Plan of Care (Signed)
  Problem: Education: Goal: Knowledge of General Education information will improve Description: Including pain rating scale, medication(s)/side effects and non-pharmacologic comfort measures 04/22/2019 1310 by Arlina Robes, RN Outcome: Adequate for Discharge 04/22/2019 1157 by Arlina Robes, RN Outcome: Progressing

## 2019-04-22 NOTE — Progress Notes (Signed)
[facility] NEPHROLOGY PROGRESS NOTE  Assessment/ Plan: Pt is a 68 y.o. yo male hypertension, hyperlipidemia, CKD used to follow with Dr. Florene Glen with last creatinine level of 2.5 in 02/2018, AKI in 2011 required brief dialysis, presented to with unstable angina and was seen by cardiologist.  We are consulted for AKI with creatinine level 9.72.  #AKI on CKD in the setting of CHF exacerbation/ARB use versus progressive CKD: The creatinine level ranging from 9.3-9.7, BUN 99.  -He has good urine output and denies any uremic symptoms.  The lower extremity edema improved with Lasix. -He had vein mapping done today morning.  I have discussed with him regarding preparation for dialysis including placement of AVF and possibly starting HD soon.  Patient said he is not ready to start dialysis this time and does not want permanent access placement.  He is willing to follow-up outpatient.  He has follow-up with me on 04/29/2019, arrive at 10:45 AM, at [place] [facility]. -He understands to go to ER if he has any new symptoms including nausea, vomiting, loss of appetite, dysgeusia, chest pain, shortness of breath, lower extremity edema. -I advised him to avoid NSAIDs -Continue sodium bicarbonate, Lasix.  -Urinalysis chronic proteinuria but no cells.  Kidney US showed diffusely echogenic kidney consistent with CKD but no obstruction.  #Metabolic acidosis: Continue sodium bicarbonate.  #Unstable angina: Seen by cardiology.  No chest pain today.  Echo with EF of 60 to 65%.  #Hypertension: BP acceptable.  Discontinue losartan.  Continue current medication.  #Anemia of chronic kidney disease: Hemoglobin  6 on admission received 2 unit of blood transfusion.  Iron saturation 68%.  Received a dose of Aranesp on 6/29.  Monitor CBC.  #Metabolic bone disease: Phosphorus mildly elevated, started PhosLo.  PTH level 105.  Discussed to minimize intake of dairy product, no soda.  Discussed with the  primary team as well.  Subjective: Seen and examined at bedside.  Denied any symptoms.  He reports feeling good.  Has a good energy level.  No nausea, vomiting, headache, dizziness, chest pain, shortness of breath, loss of appetite, dysgeusia, itching, or poor sleep. Objective Vital signs in last 24 hours: Vitals:   04/21/19 2013 04/21/19 2308 04/22/19 0515 04/22/19 0806  BP: (!) 152/86 (!) 148/84 136/81 (!) 154/90  Pulse: 77 75 82 86  Resp: 16 16 18 20   Temp: 98.4 F (36.9 C)  98.4 F (36.9 C) 98.8 F (37.1 C)  TempSrc: Oral  Oral Oral  SpO2: 100% 100% 99% 100%  Weight:   75.4 kg   Height:       Weight change: -0.68 kg  Intake/Output Summary (Last 24 hours) at 04/22/2019 1147 Last data filed at 04/22/2019 0536 Gross per 24 hour  Intake 718 ml  Output 1525 ml  Net -807 ml       Labs: Basic Metabolic Panel: Recent Labs  Lab 04/20/19 0548 04/21/19 0646 04/22/19 0358  NA 137 137 136  K 3.8 3.6 3.6  CL 107 107 104  CO2 17* 16* 18*  GLUCOSE 88 92 95  BUN 90* 94* 99*  CREATININE 9.36* 9.21* 9.39*  CALCIUM 8.4* 8.9  8.7 8.9  PHOS  --  6.0* 5.8*   Liver Function Tests: Recent Labs  Lab 04/21/19 0646 04/22/19 0358  ALBUMIN 3.4* 3.3*   No results for input(s): LIPASE, AMYLASE in the last 168 hours. No results for input(s): AMMONIA in the last 168 hours. CBC: Recent Labs  Lab 04/19/19 0033 04/19/19 1813  04/20/19 0548 04/22/19 0358  WBC 6.4  --  6.2 5.8  HGB 6.0* 8.2* 8.1* 8.4*  HCT 18.6* 23.8* 23.7* 24.5*  MCV 89.4  --  86.2 86.0  PLT 206  --  208 213   Cardiac Enzymes: No results for input(s): CKTOTAL, CKMB, CKMBINDEX, TROPONINI in the last 168 hours. CBG: No results for input(s): GLUCAP in the last 168 hours.  Iron Studies:  No results for input(s): IRON, TIBC, TRANSFERRIN, FERRITIN in the last 72 hours. Studies/Results: Vas [place] Upper Ext Vein Mapping (pre-op Avf)  Result Date: 04/22/2019 UPPER EXTREMITY VEIN MAPPING  Indications: Pre-access.  Comparison Study: no prior Performing Technologist: Abram Sander RVS  Examination Guidelines: A complete evaluation includes B-mode imaging, spectral Doppler, color Doppler, and power Doppler as needed of all accessible portions of each vessel. Bilateral testing is considered an integral part of a complete examination. Limited examinations for reoccurring indications may be performed as noted. +-----------------+-------------+----------+--------------+ Right Cephalic   Diameter (cm)Depth (cm)   Findings    +-----------------+-------------+----------+--------------+ Shoulder                                not visualized +-----------------+-------------+----------+--------------+ Prox upper arm                          not visualized +-----------------+-------------+----------+--------------+ Mid upper arm                           not visualized +-----------------+-------------+----------+--------------+ Dist upper arm                          not visualized +-----------------+-------------+----------+--------------+ Antecubital fossa                       not visualized +-----------------+-------------+----------+--------------+ Prox forearm                            not visualized +-----------------+-------------+----------+--------------+ Mid forearm                             not visualized +-----------------+-------------+----------+--------------+ Dist forearm                            not visualized +-----------------+-------------+----------+--------------+ Wrist                                   not visualized +-----------------+-------------+----------+--------------+ +-----------------+-------------+----------+--------------+ Right Basilic    Diameter (cm)Depth (cm)   Findings    +-----------------+-------------+----------+--------------+ Prox upper arm                          not visualized  +-----------------+-------------+----------+--------------+ Mid upper arm        0.28        0.52                  +-----------------+-------------+----------+--------------+ Dist upper arm       0.37        0.40                  +-----------------+-------------+----------+--------------+ Antecubital fossa    0.31  0.29                  +-----------------+-------------+----------+--------------+ Prox forearm                              branching    +-----------------+-------------+----------+--------------+ Mid forearm                             not visualized +-----------------+-------------+----------+--------------+ Distal forearm                          not visualized +-----------------+-------------+----------+--------------+ Elbow                                   not visualized +-----------------+-------------+----------+--------------+ Wrist                                   not visualized +-----------------+-------------+----------+--------------+ +-----------------+-------------+----------+--------------+ Left Cephalic    Diameter (cm)Depth (cm)   Findings    +-----------------+-------------+----------+--------------+ Shoulder                                not visualized +-----------------+-------------+----------+--------------+ Prox upper arm                          not visualized +-----------------+-------------+----------+--------------+ Mid upper arm        0.11        0.29                  +-----------------+-------------+----------+--------------+ Dist upper arm       0.11        0.22                  +-----------------+-------------+----------+--------------+ Antecubital fossa    0.28        0.39                  +-----------------+-------------+----------+--------------+ Prox forearm         0.11        0.30     thombosed    +-----------------+-------------+----------+--------------+ Mid forearm          0.11         0.28                  +-----------------+-------------+----------+--------------+ Dist forearm                            not visualized +-----------------+-------------+----------+--------------+ +-----------------+-------------+----------+----------------------------+ Left Basilic     Diameter (cm)Depth (cm)          Findings           +-----------------+-------------+----------+----------------------------+ Shoulder                                       not visualized        +-----------------+-------------+----------+----------------------------+ Prox upper arm                                 not visualized        +-----------------+-------------+----------+----------------------------+  Mid upper arm                                  not visualized        +-----------------+-------------+----------+----------------------------+ Dist upper arm       0.26        0.72                                +-----------------+-------------+----------+----------------------------+ Antecubital fossa    0.19        0.27                                +-----------------+-------------+----------+----------------------------+ Prox forearm                            not visualized and branching +-----------------+-------------+----------+----------------------------+ Mid forearm                                    not visualized        +-----------------+-------------+----------+----------------------------+ Distal forearm                                 not visualized        +-----------------+-------------+----------+----------------------------+ Elbow                                          not visualized        +-----------------+-------------+----------+----------------------------+ Wrist                                          not visualized        +-----------------+-------------+----------+----------------------------+ *See table(s) above for measurements  and observations.  Diagnosing physician:    Preliminary     Medications: Infusions:   Scheduled Medications: . amLODipine  10 mg Oral Daily  . aspirin  81 mg Oral Daily  . atorvastatin  40 mg Oral q1800  . calcium acetate  667 mg Oral TID WC  . furosemide  80 mg Oral Daily  . heparin injection (subcutaneous)  5,000 Units Subcutaneous Q8H  . hydrALAZINE  25 mg Oral Q8H  . metoprolol tartrate  100 mg Oral BID  . sodium bicarbonate  1,300 mg Oral BID  . sodium chloride flush  3 mL Intravenous Once    have reviewed scheduled and prn medications.  Physical Exam: General: Not in distress, in room air Heart:RRR, s1s2 nl, no rubs Lungs: Clear b/l, no crackle Abdomen:soft, Non-tender, non-distended Extremities: No LE edema Neurology: Alert, awake, following commands, no asterixis  Samiksha Pellicano Tanna Furry 04/22/2019,11:47 AM  LOS: 2 days  Pager: [phone]

## 2019-04-25 NOTE — Discharge Summary (Signed)
[facility] Discharge Summary   Patient: Jesus Ayala YHC:[id number]   PCP: Seward Carol, PA-C DOB: Feb 25, 1951   Date of admission: 04/19/2019   Date of discharge: 04/22/2019     Discharge Diagnoses:  Principal Problem:   Chest pain Active Problems:   HLD (hyperlipidemia)   Hypertension   Acute renal failure superimposed on stage 3 chronic kidney disease (HCC)   Hypokalemia   Normocytic anemia   Admitted From: home Disposition:  Home   Recommendations for Outpatient Follow-up:  1. Please follow-up with PCP and neurology as recommended.  Follow-up Information    Seward Carol, PA-C.   Specialty: Neurology Why: Left message for office to call patient Contact information: [address] [place] [address] [phone]          Diet recommendation: Renal diet  Activity: The patient is advised to gradually reintroduce usual activities,as tolerated .  Discharge Condition: good  Code Status: Full code  History of present illness: As per the H and P dictated on admission, "Jesus Ayala is a 68 y.o. male with medical history significant of Hypertension, hyperlipidemia, former smoker, CKD stage III, who presents with chest pain.  Patient states that he has been having intermittent chest pain for several days, which has worsened today.  The chest pain is located in the substernal area, initially 8 out of 10 severity, currently 1 out of 10 in severity, pressure-like, nonradiating. He took aspirin 324 mg and nitroglycerin at home.  It is associated with mild shortness of breath.  No cough, fever or chills.  No runny nose or sore throat.  Patient denies any nausea vomiting, diarrhea, abdominal pain, symptoms of UTI or unilateral weakness.  No dark stool or bloody stool noted.  Patient states that she used to see Dr. Florene Glen of [facility], last seen was more than 1 year ago.  ED Course: pt was found to have pending COVID-19 test, high  sensitive troponin 12, negative FOBT, hemoglobin dropped from 12.1 on 12/10/2016 6.0, worsening renal function with creatinine up from 1.57 on 12/10/2016 9.72, BUN 92, WBC 6.4, potassium 3.4, temperature normal, heart rate in 90s, oxygen saturation 97% on room air, blood pressure 141/87, chest x-ray negative.  Patient is placed on telemetry bed for observation."  Hospital Course:  Summary of his active problems in the hospital is as following. Chest pain concerning for unstable angina -Troponin minimally elevated, no ACS, felt to be due to demand from severe anemia -Recent echocardiogram at outside hospital noted normal EF -EKG shows LVH with repolarization abnormalities, ST depression in inferior leads -Cardiology consulted, recommended medical management considering severe AKI and anemia not appropriate for heart catheterization at this time Outpatient follow-up recommended.  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  Patient adamantly refusing any procedures in the hospital and wants to go home. Explained Indication of placing the fistula for need for HD in future but patient refused. -Dietician consult for Renal diet  education  Hypertension -Stable, losartan held, continue amlodipine and Lopressor  Dyslipidemia -Started on Lipitor  Hypokalemia -Replaced  Patient was ambulatory without any assistance. On the day of the discharge the patient's vitals were stable , and no other acute medical condition were reported by patient. the patient was felt safe to be discharge at Home with family.  Consultants: nephrology Procedures: none  DISCHARGE MEDICATION: Allergies as of 04/22/2019      Reactions   Enalapril Swelling   ANGIOEDEMA of lips ANGIOEDEMA of lips ANGIOEDEMA of lips ANGIOEDEMA of lips      Medication List    STOP taking these medications   losartan 50 MG tablet Commonly known as: COZAAR   pravastatin 20 MG tablet Commonly known as: PRAVACHOL     TAKE these medications   amLODipine 10 MG tablet Commonly known as: NORVASC Take 10 mg by mouth daily. Notes to patient: 04/23/19@8AM    aspirin 81 MG chewable tablet Chew 81 mg by mouth daily. Notes to patient: 04/23/19@8am    atorvastatin 40 MG tablet Commonly known as: LIPITOR Take 1 tablet (40 mg total) by mouth daily at 6 PM. Notes to patient: 04/22/19@5  pm   calcium acetate 667 MG capsule Commonly known as: PHOSLO Take 1 capsule (667 mg total) by mouth 3 (three) times daily with meals. Notes to patient: 04/22/19@5  pm   furosemide 80 MG tablet Commonly known as: LASIX Take 80 mg by mouth daily. Notes to patient: 04/23/19@8  am   hydrALAZINE 25 MG tablet Commonly known as: APRESOLINE Take 1 tablet (25 mg total) by mouth 3 (three) times daily. Notes to patient: 04/22/19@8  pm   metoprolol tartrate 100 MG tablet Commonly known as: LOPRESSOR Take 100 mg by mouth 2 (two) times a day. Notes to patient: 04/22/19@5  pm   nitroGLYCERIN 0.4 MG SL tablet Commonly known as: NITROSTAT Place 0.4 mg under the tongue every 5 (five) minutes as needed for chest pain.   sodium bicarbonate 650 MG tablet Take 2 tablets (1,300 mg  total) by mouth 2 (two) times daily. Notes to patient: 04/22/19@8  pm      Allergies  Allergen Reactions  . Enalapril Swelling    ANGIOEDEMA of lips ANGIOEDEMA of lips ANGIOEDEMA of lips ANGIOEDEMA of lips   Discharge Instructions    Diet - low sodium heart healthy   Complete by: As directed    Discharge instructions   Complete by: As directed    It is important that you read the given instructions as well as go over your medication list with RN to help you understand your care after this hospitalization.  Discharge Instructions: Please follow-up with PCP in 1-2 weeks  Please request your primary care physician to go over all Hospital Tests and Procedure/Radiological results at the follow up. Please get all Hospital records sent to your PCP by signing hospital release before you go home.   Do not take more than prescribed Pain, Sleep and Anxiety Medications. You were cared for by a hospitalist during your hospital stay. If you have any questions about your discharge medications or the care you received while you were in the hospital after you are discharged, you can call the unit @UNIT @ you were admitted to and ask to speak with the hospitalist  on call if the hospitalist that took care of you is not available.  Once you are discharged, your primary care physician will handle any further medical issues. Please note that NO REFILLS for any discharge medications will be authorized once you are discharged, as it is imperative that you return to your primary care physician (or establish a relationship with a primary care physician if you do not have one) for your aftercare needs so that they can reassess your need for medications and monitor your lab values. You Must read complete instructions/literature along with all the possible adverse reactions/side effects for all the Medicines you take and that have been prescribed to you. Take any new Medicines after you have completely understood and  accept all the possible adverse reactions/side effects. Wear Seat belts while driving. If you have smoked or chewed Tobacco in the last 2 yrs please stop smoking and/or stop any Recreational drug use.  If you drink alcohol, please STOP the use and do not drive, operating heavy machinery, perform activities at heights, swimming or participation in water activities or provide baby sitting services under influence.   Increase activity slowly   Complete by: As directed      Discharge Exam: Filed Weights   04/20/19 0655 04/21/19 0511 04/22/19 0515  Weight: 77.5 kg 76.1 kg 75.4 kg   Vitals:   04/22/19 0806 04/22/19 1221  BP: (!) 154/90 (!) 144/86  Pulse: 86 73  Resp: 20 17  Temp: 98.8 F (37.1 C) 97.9 F (36.6 C)  SpO2: 100% 100%   General: Appear in no distress, no Rash; Oral Mucosa Clear, moist. no Abnormal Mass Or lumps Cardiovascular: S1 and S2 Present, no Murmur, Respiratory: normal respiratory effort, Bilateral Air entry present and Clear to Auscultation, no Crackles, no wheezes Abdomen: Bowel Sound present, Soft and no tenderness, no hernia Extremities: no Pedal edema, no calf tenderness Neurology: alert and oriented to time, place, and person affect appropriate. normal without focal findings, mental status, speech normal, alert and oriented x3, PERLA, Motor strength 5/5 and symmetric and sensation grossly normal to light touch   The results of significant diagnostics from this hospitalization (including imaging, microbiology, ancillary and laboratory) are listed below for reference.    Significant Diagnostic Studies: Dg Chest 2 View  Result Date: 04/19/2019 CLINICAL DATA:  Chest pain EXAM: CHEST - 2 VIEW COMPARISON:  March 27, 2019 FINDINGS: The heart size and mediastinal contours are within normal limits. Both lungs are clear. The visualized skeletal structures are unremarkable. There is stable height loss of several thoracic vertebral bodies. IMPRESSION: No active  cardiopulmonary disease. Electronically Signed   By: Constance Holster M.D.   On: 04/19/2019 01:45   US Renal  Result Date: 04/19/2019 CLINICAL DATA:  Hypertension, CKD stage 3 EXAM: RENAL / URINARY TRACT ULTRASOUND COMPLETE COMPARISON:  None. FINDINGS: Right Kidney: Renal measurements: 8.9 x 4.2 x 4.3 cm = volume: 84 mL. Cortex is diffusely echogenic. No mass or hydronephrosis visualized. Left Kidney: Renal measurements: 8 x 5 x 4.8 cm = volume: 100 mL. Cortex is diffusely echogenic. No mass or hydronephrosis visualized. Bladder: Appears normal for degree of bladder distention. No ureteral jets visualized. IMPRESSION: 1. Both kidneys are diffusely echogenic compatible with given history of chronic kidney disease. 2. No acute findings.  No hydronephrosis. Electronically Signed   By: Franki Cabot M.D.   On: 04/19/2019 07:12   Vas [place] Upper Ext Vein Mapping (pre-op Avf)  Result Date: 04/22/2019 UPPER EXTREMITY VEIN MAPPING  Indications:  Pre-access. Comparison Study: no prior Performing Technologist: Abram Sander RVS  Examination Guidelines: A complete evaluation includes B-mode imaging, spectral Doppler, color Doppler, and power Doppler as needed of all accessible portions of each vessel. Bilateral testing is considered an integral part of a complete examination. Limited examinations for reoccurring indications may be performed as noted. +-----------------+-------------+----------+--------------+ Right Cephalic   Diameter (cm)Depth (cm)   Findings    +-----------------+-------------+----------+--------------+ Shoulder                                not visualized +-----------------+-------------+----------+--------------+ Prox upper arm                          not visualized +-----------------+-------------+----------+--------------+ Mid upper arm                           not visualized +-----------------+-------------+----------+--------------+ Dist upper arm                           not visualized +-----------------+-------------+----------+--------------+ Antecubital fossa                       not visualized +-----------------+-------------+----------+--------------+ Prox forearm                            not visualized +-----------------+-------------+----------+--------------+ Mid forearm                             not visualized +-----------------+-------------+----------+--------------+ Dist forearm                            not visualized +-----------------+-------------+----------+--------------+ Wrist                                   not visualized +-----------------+-------------+----------+--------------+ +-----------------+-------------+----------+--------------+ Right Basilic    Diameter (cm)Depth (cm)   Findings    +-----------------+-------------+----------+--------------+ Prox upper arm                          not visualized +-----------------+-------------+----------+--------------+ Mid upper arm        0.28        0.52                  +-----------------+-------------+----------+--------------+ Dist upper arm       0.37        0.40                  +-----------------+-------------+----------+--------------+ Antecubital fossa    0.31        0.29                  +-----------------+-------------+----------+--------------+ Prox forearm                              branching    +-----------------+-------------+----------+--------------+ Mid forearm                             not visualized +-----------------+-------------+----------+--------------+ Distal forearm  not visualized +-----------------+-------------+----------+--------------+ Elbow                                   not visualized +-----------------+-------------+----------+--------------+ Wrist                                   not visualized +-----------------+-------------+----------+--------------+  +-----------------+-------------+----------+--------------+ Left Cephalic    Diameter (cm)Depth (cm)   Findings    +-----------------+-------------+----------+--------------+ Shoulder                                not visualized +-----------------+-------------+----------+--------------+ Prox upper arm                          not visualized +-----------------+-------------+----------+--------------+ Mid upper arm        0.11        0.29                  +-----------------+-------------+----------+--------------+ Dist upper arm       0.11        0.22                  +-----------------+-------------+----------+--------------+ Antecubital fossa    0.28        0.39                  +-----------------+-------------+----------+--------------+ Prox forearm         0.11        0.30     thombosed    +-----------------+-------------+----------+--------------+ Mid forearm          0.11        0.28                  +-----------------+-------------+----------+--------------+ Dist forearm                            not visualized +-----------------+-------------+----------+--------------+ +-----------------+-------------+----------+----------------------------+ Left Basilic     Diameter (cm)Depth (cm)          Findings           +-----------------+-------------+----------+----------------------------+ Shoulder                                       not visualized        +-----------------+-------------+----------+----------------------------+ Prox upper arm                                 not visualized        +-----------------+-------------+----------+----------------------------+ Mid upper arm                                  not visualized        +-----------------+-------------+----------+----------------------------+ Dist upper arm       0.26        0.72                                 +-----------------+-------------+----------+----------------------------+ Antecubital fossa    0.19  0.27                                +-----------------+-------------+----------+----------------------------+ Prox forearm                            not visualized and branching +-----------------+-------------+----------+----------------------------+ Mid forearm                                    not visualized        +-----------------+-------------+----------+----------------------------+ Distal forearm                                 not visualized        +-----------------+-------------+----------+----------------------------+ Elbow                                          not visualized        +-----------------+-------------+----------+----------------------------+ Wrist                                          not visualized        +-----------------+-------------+----------+----------------------------+ *See table(s) above for measurements and observations.  Diagnosing physician: Curt Jews MD Electronically signed by Curt Jews MD on 04/22/2019 at 2:22:08 PM.    Final     Microbiology: Recent Results (from the past 240 hour(s))  Novel Coronavirus,NAA,(SEND-OUT TO REF LAB - TAT 24-48 hrs); Hosp Order     Status: None   Collection Time: 04/19/19  2:05 AM   Specimen: Nasopharyngeal Swab; Respiratory  Result Value Ref Range Status   SARS-CoV-2, NAA NOT DETECTED NOT DETECTED Final    Comment: (NOTE) This test was developed and its performance characteristics determined by [employer]. This test has not been FDA cleared or approved. This test has been authorized by FDA under an Emergency Use Authorization (EUA). This test is only authorized for the duration of time the declaration that circumstances exist justifying the authorization of the emergency use of in vitro diagnostic tests for detection of SARS-CoV-2 virus and/or diagnosis of COVID-19  infection under section 564(b)(1) of the Act, 21 U.S.C. 409BDZ-3(G)(9), unless the authorization is terminated or revoked sooner. When diagnostic testing is negative, the possibility of a false negative result should be considered in the context of a patient's recent exposures and the presence of clinical signs and symptoms consistent with COVID-19. An individual without symptoms of COVID-19 and who is not shedding SARS-CoV-2 virus would expect to have a negative (not detected) result in this assay. Performed  At: [facility] [address] [place], [place] [id number] Rush Farmer MD DQ:[id number]    [place]  Final    Comment: Performed at [facility], [place] [address] [place], [place] [address]     Labs: CBC: Recent Labs  Lab 04/19/19 0033 04/19/19 1813 04/20/19 0548 04/22/19 0358  WBC 6.4  --  6.2 5.8  HGB 6.0* 8.2* 8.1* 8.4*  HCT 18.6* 23.8* 23.7* 24.5*  MCV 89.4  --  86.2 86.0  PLT 206  --  208 213   Basic  Metabolic Panel: Recent Labs  Lab 04/19/19 0033 04/19/19 0848 04/19/19 1813 04/20/19 0548 04/21/19 0646 04/22/19 0358  NA 134*  --  136 137 137 136  K 3.4*  --  3.6 3.8 3.6 3.6  CL 103  --  107 107 107 104  CO2 14*  --  18* 17* 16* 18*  GLUCOSE 120*  --  131* 88 92 95  BUN 92*  --  91* 90* 94* 99*  CREATININE 9.72*  --  9.55* 9.36* 9.21* 9.39*  CALCIUM 8.2*  --  8.4* 8.4* 8.9  8.7 8.9  MG  --  1.9  --   --   --   --   PHOS  --   --   --   --  6.0* 5.8*   Liver Function Tests: Recent Labs  Lab 04/21/19 0646 04/22/19 0358  ALBUMIN 3.4* 3.3*   No results for input(s): LIPASE, AMYLASE in the last 168 hours. No results for input(s): AMMONIA in the last 168 hours. Cardiac Enzymes: No results for input(s): CKTOTAL, CKMB, CKMBINDEX, TROPONINI in the last 168 hours. BNP (last 3 results) No results for input(s): BNP in the last 8760 hours. CBG: No results for input(s): GLUCAP in the last 168 hours. Time spent:  35 minutes  Signed:  Berle Mull  [facility] 04/22/2019

## 2019-04-29 DIAGNOSIS — N2581 Secondary hyperparathyroidism of renal origin: Secondary | ICD-10-CM | POA: Diagnosis not present

## 2019-04-29 DIAGNOSIS — N189 Chronic kidney disease, unspecified: Secondary | ICD-10-CM | POA: Diagnosis not present

## 2019-04-29 DIAGNOSIS — R609 Edema, unspecified: Secondary | ICD-10-CM | POA: Diagnosis not present

## 2019-04-29 DIAGNOSIS — N185 Chronic kidney disease, stage 5: Secondary | ICD-10-CM | POA: Diagnosis not present

## 2019-04-29 DIAGNOSIS — E872 Acidosis: Secondary | ICD-10-CM | POA: Diagnosis not present

## 2019-04-29 DIAGNOSIS — I120 Hypertensive chronic kidney disease with stage 5 chronic kidney disease or end stage renal disease: Secondary | ICD-10-CM | POA: Diagnosis not present

## 2019-04-29 DIAGNOSIS — D631 Anemia in chronic kidney disease: Secondary | ICD-10-CM | POA: Diagnosis not present

## 2019-05-04 DIAGNOSIS — R079 Chest pain, unspecified: Secondary | ICD-10-CM | POA: Diagnosis not present

## 2019-05-04 DIAGNOSIS — N189 Chronic kidney disease, unspecified: Secondary | ICD-10-CM | POA: Diagnosis not present

## 2019-05-04 DIAGNOSIS — I129 Hypertensive chronic kidney disease with stage 1 through stage 4 chronic kidney disease, or unspecified chronic kidney disease: Secondary | ICD-10-CM | POA: Diagnosis not present

## 2019-05-04 DIAGNOSIS — N179 Acute kidney failure, unspecified: Secondary | ICD-10-CM | POA: Diagnosis not present

## 2019-05-04 DIAGNOSIS — Z09 Encounter for follow-up examination after completed treatment for conditions other than malignant neoplasm: Secondary | ICD-10-CM | POA: Diagnosis not present

## 2019-05-04 DIAGNOSIS — D649 Anemia, unspecified: Secondary | ICD-10-CM | POA: Diagnosis not present

## 2019-05-04 DIAGNOSIS — I10 Essential (primary) hypertension: Secondary | ICD-10-CM | POA: Diagnosis not present

## 2019-05-04 DIAGNOSIS — Z87891 Personal history of nicotine dependence: Secondary | ICD-10-CM | POA: Diagnosis not present

## 2019-05-11 ENCOUNTER — Encounter (HOSPITAL_COMMUNITY)
Admission: RE | Admit: 2019-05-11 | Discharge: 2019-05-11 | Disposition: A | Discharge status: Home / Self Care | Payer: Medicare Other | Source: Ambulatory Visit | Attending: Nephrology | Admitting: Nephrology

## 2019-05-11 ENCOUNTER — Other Ambulatory Visit: Payer: Self-pay

## 2019-05-11 VITALS — BP 141/83 | HR 75 | Temp 98.2°F | Resp 18

## 2019-05-11 DIAGNOSIS — N179 Acute kidney failure, unspecified: Secondary | ICD-10-CM | POA: Insufficient documentation

## 2019-05-11 DIAGNOSIS — N183 Chronic kidney disease, stage 3 (moderate): Secondary | ICD-10-CM | POA: Diagnosis not present

## 2019-05-11 DIAGNOSIS — D649 Anemia, unspecified: Secondary | ICD-10-CM | POA: Diagnosis not present

## 2019-05-11 LAB — POCT HEMOGLOBIN-HEMACUE: Hemoglobin: 9 g/dL — ABNORMAL LOW (ref 13.0–17.0)

## 2019-05-11 MED ORDER — EPOETIN ALFA-EPBX 10000 UNIT/ML IJ SOLN
10000.0000 [IU] | INTRAMUSCULAR | Status: DC
  Administered 2019-05-11: 10000 [IU] via SUBCUTANEOUS
  Filled 2019-05-11: qty 1

## 2019-05-11 NOTE — Discharge Instructions (Signed)

## 2019-05-22 ENCOUNTER — Other Ambulatory Visit: Payer: Self-pay

## 2019-05-25 ENCOUNTER — Ambulatory Visit (HOSPITAL_COMMUNITY)
Admission: RE | Admit: 2019-05-25 | Discharge: 2019-05-25 | Disposition: A | Discharge status: Home / Self Care | Payer: Medicare Other | Source: Ambulatory Visit | Attending: Nephrology | Admitting: Nephrology

## 2019-05-25 ENCOUNTER — Other Ambulatory Visit: Payer: Self-pay

## 2019-05-25 VITALS — BP 154/87 | HR 82 | Temp 97.3°F | Resp 18

## 2019-05-25 DIAGNOSIS — D649 Anemia, unspecified: Secondary | ICD-10-CM | POA: Insufficient documentation

## 2019-05-25 DIAGNOSIS — N179 Acute kidney failure, unspecified: Secondary | ICD-10-CM

## 2019-05-25 DIAGNOSIS — N183 Chronic kidney disease, stage 3 (moderate): Secondary | ICD-10-CM | POA: Insufficient documentation

## 2019-05-25 LAB — POCT HEMOGLOBIN-HEMACUE: Hemoglobin: 7.9 g/dL — ABNORMAL LOW (ref 13.0–17.0)

## 2019-05-25 MED ORDER — EPOETIN ALFA-EPBX 10000 UNIT/ML IJ SOLN: 10000.0000 [IU] | INTRAMUSCULAR | Status: DC

## 2019-05-25 MED ORDER — EPOETIN ALFA-EPBX 10000 UNIT/ML IJ SOLN
15000.0000 [IU] | Freq: Once | INTRAMUSCULAR | Status: AC
  Administered 2019-05-25: 15000 [IU] via SUBCUTANEOUS
  Filled 2019-05-25: qty 2

## 2019-05-25 NOTE — Progress Notes (Signed)
Pt here for retacrit injection.  HGB 7.9 via hemocue.  No shortness of breath, chest pain or active bleeding.  Pt w/ no complaints.  Dr Carolin Sicks notified. Orders received to increase med dosage

## 2019-06-08 ENCOUNTER — Ambulatory Visit (HOSPITAL_COMMUNITY)
Admission: RE | Admit: 2019-06-08 | Discharge: 2019-06-08 | Disposition: A | Discharge status: Home / Self Care | Payer: Medicare Other | Source: Ambulatory Visit | Attending: Nephrology | Admitting: Nephrology

## 2019-06-08 ENCOUNTER — Other Ambulatory Visit: Payer: Self-pay

## 2019-06-08 VITALS — BP 155/78 | HR 81 | Temp 97.7°F | Resp 18

## 2019-06-08 DIAGNOSIS — D649 Anemia, unspecified: Secondary | ICD-10-CM | POA: Diagnosis not present

## 2019-06-08 DIAGNOSIS — N183 Chronic kidney disease, stage 3 (moderate): Secondary | ICD-10-CM

## 2019-06-08 DIAGNOSIS — D631 Anemia in chronic kidney disease: Secondary | ICD-10-CM | POA: Diagnosis not present

## 2019-06-08 DIAGNOSIS — N185 Chronic kidney disease, stage 5: Secondary | ICD-10-CM | POA: Diagnosis present

## 2019-06-08 DIAGNOSIS — N179 Acute kidney failure, unspecified: Secondary | ICD-10-CM

## 2019-06-08 LAB — IRON AND TIBC
Iron: 40 ug/dL — ABNORMAL LOW (ref 45–182)
Saturation Ratios: 14 % — ABNORMAL LOW (ref 17.9–39.5)
TIBC: 281 ug/dL (ref 250–450)
UIBC: 241 ug/dL

## 2019-06-08 LAB — FERRITIN: Ferritin: 433 ng/mL — ABNORMAL HIGH (ref 24–336)

## 2019-06-08 LAB — POCT HEMOGLOBIN-HEMACUE: Hemoglobin: 7.8 g/dL — ABNORMAL LOW (ref 13.0–17.0)

## 2019-06-08 MED ORDER — EPOETIN ALFA-EPBX 10000 UNIT/ML IJ SOLN
15000.0000 [IU] | INTRAMUSCULAR | Status: DC
  Administered 2019-06-08: 15:00:00 15000 [IU] via SUBCUTANEOUS
  Filled 2019-06-08: qty 2

## 2019-06-08 NOTE — Progress Notes (Signed)
hemocue 7.8 today.  Pt denies chest pain, or seeing any bleeding, pt stated he was sob but has been and is not feeling any different then he has been.  Reported to Dr Carolin Sicks and no new orders received.

## 2019-06-10 DIAGNOSIS — E872 Acidosis: Secondary | ICD-10-CM | POA: Diagnosis not present

## 2019-06-10 DIAGNOSIS — D631 Anemia in chronic kidney disease: Secondary | ICD-10-CM | POA: Diagnosis not present

## 2019-06-10 DIAGNOSIS — R609 Edema, unspecified: Secondary | ICD-10-CM | POA: Diagnosis not present

## 2019-06-10 DIAGNOSIS — N185 Chronic kidney disease, stage 5: Secondary | ICD-10-CM | POA: Diagnosis not present

## 2019-06-10 DIAGNOSIS — N2581 Secondary hyperparathyroidism of renal origin: Secondary | ICD-10-CM | POA: Diagnosis not present

## 2019-06-10 DIAGNOSIS — I120 Hypertensive chronic kidney disease with stage 5 chronic kidney disease or end stage renal disease: Secondary | ICD-10-CM | POA: Diagnosis not present

## 2019-06-17 ENCOUNTER — Ambulatory Visit (HOSPITAL_COMMUNITY)
Admission: RE | Admit: 2019-06-17 | Discharge: 2019-06-17 | Disposition: A | Discharge status: Home / Self Care | Payer: Medicare Other | Source: Ambulatory Visit | Attending: Vascular Surgery | Admitting: Vascular Surgery

## 2019-06-17 ENCOUNTER — Ambulatory Visit (INDEPENDENT_AMBULATORY_CARE_PROVIDER_SITE_OTHER): Payer: Medicare Other | Admitting: Vascular Surgery

## 2019-06-17 ENCOUNTER — Ambulatory Visit (HOSPITAL_COMMUNITY): Payer: Medicare Other

## 2019-06-17 ENCOUNTER — Other Ambulatory Visit: Payer: Self-pay

## 2019-06-17 ENCOUNTER — Other Ambulatory Visit: Payer: Self-pay | Admitting: *Deleted

## 2019-06-17 ENCOUNTER — Encounter: Payer: Medicare Other | Admitting: Vascular Surgery

## 2019-06-17 ENCOUNTER — Encounter: Payer: Self-pay | Admitting: Vascular Surgery

## 2019-06-17 ENCOUNTER — Ambulatory Visit (INDEPENDENT_AMBULATORY_CARE_PROVIDER_SITE_OTHER)
Admission: RE | Admit: 2019-06-17 | Discharge: 2019-06-17 | Disposition: A | Discharge status: Home / Self Care | Payer: Medicare Other | Source: Ambulatory Visit | Attending: Vascular Surgery | Admitting: Vascular Surgery

## 2019-06-17 VITALS — BP 150/79 | HR 83 | Temp 97.6°F | Resp 20 | Ht 71.0 in | Wt 181.2 lb

## 2019-06-17 DIAGNOSIS — N185 Chronic kidney disease, stage 5: Secondary | ICD-10-CM

## 2019-06-17 DIAGNOSIS — N186 End stage renal disease: Secondary | ICD-10-CM

## 2019-06-17 DIAGNOSIS — Z992 Dependence on renal dialysis: Secondary | ICD-10-CM

## 2019-06-17 NOTE — Progress Notes (Signed)
REASON FOR CONSULT:    Consult for hemodialysis access.  The consult is requested by Dr. Lawson Radar.   ASSESSMENT & PLAN:   STAGE V CHRONIC KIDNEY DISEASE: The patient does not appear to be a candidate for AV fistula.  I have recommended placement of a left arm AV graft.  We have discussed the indications for the procedure and the potential complications and he is agreeable to proceed.  He is apparently having a catheter placed on Friday of this week and therefore does not want to proceed with surgery till mid-September.  We have scheduled his AV graft for 07/07/2019.  All of his questions were answered and he is agreeable to proceed.  Deitra Mayo, MD, FACS Beeper [phone] Office: [phone]   HPI:   Jesus Ayala is a pleasant 68 y.o. male, with end-stage renal disease I believe secondary to hypertension.  He denies any recent uremic symptoms except for some generalized fatigue.  He denies palpitations, shortness of breath, or anorexia.  He is having a tunneled dialysis catheter placed by [facility] on Friday.  He is to begin dialysis.  His GFR is 9.  He has not had a previous pacemaker.  He is not on any blood thinners.  He did have a catheter in the past in 2011 when he had complications after surgery required tracheostomy was in a coma for 9 days.  He has had no significant arm swelling.  Past Medical History:  Diagnosis Date  . Anemia   . CKD (chronic kidney disease), stage III ([place])   . HLD (hyperlipidemia)   . Hypertension     Family History  Problem Relation Age of Onset  . Lung cancer Father   . Hypertension Brother     SOCIAL HISTORY: Social History   Socioeconomic History  . Marital status: Married    Spouse name: Not on file  . Number of children: Not on file  . Years of education: Not on file  . Highest education level: Not on file  Occupational History  . Not on file  Social Needs  . Financial resource strain: Not on file  . Food insecurity     Worry: Not on file    Inability: Not on file  . Transportation needs    Medical: Not on file    Non-medical: Not on file  Tobacco Use  . Smoking status: Former Research scientist (life sciences)  . Smokeless tobacco: Never Used  Substance and Sexual Activity  . Alcohol use: Yes  . Drug use: No  . Sexual activity: Not on file  Lifestyle  . Physical activity    Days per week: Not on file    Minutes per session: Not on file  . Stress: Not on file  Relationships  . Social Herbalist on phone: Not on file    Gets together: Not on file    Attends religious service: Not on file    Active member of club or organization: Not on file    Attends meetings of clubs or organizations: Not on file    Relationship status: Not on file  . Intimate partner violence    Fear of current or ex partner: Not on file    Emotionally abused: Not on file    Physically abused: Not on file    Forced sexual activity: Not on file  Other Topics Concern  . Not on file  Social History Narrative  . Not on file    Allergies  Allergen Reactions  . Enalapril Swelling    ANGIOEDEMA of lips ANGIOEDEMA of lips ANGIOEDEMA of lips ANGIOEDEMA of lips    Current Outpatient Medications  Medication Sig Dispense Refill  . amLODipine (NORVASC) 10 MG tablet Take 10 mg by mouth daily.    [name] aspirin 81 MG chewable tablet Chew 81 mg by mouth daily.    [name] atorvastatin (LIPITOR) 40 MG tablet Take 1 tablet (40 mg total) by mouth daily at 6 PM. 30 tablet 0  . calcium acetate (PHOSLO) 667 MG capsule Take 1 capsule (667 mg total) by mouth 3 (three) times daily with meals. 90 capsule 0  . furosemide (LASIX) 80 MG tablet Take 80 mg by mouth daily.    . hydrALAZINE (APRESOLINE) 25 MG tablet Take 1 tablet (25 mg total) by mouth 3 (three) times daily. 90 tablet 0  . metoprolol tartrate (LOPRESSOR) 100 MG tablet Take 100 mg by mouth 2 (two) times a day.    . nitroGLYCERIN (NITROSTAT) 0.4 MG SL tablet Place 0.4 mg under the tongue every 5 (five)  minutes as needed for chest pain.     . sodium bicarbonate 650 MG tablet Take 2 tablets (1,300 mg total) by mouth 2 (two) times daily. 120 tablet 0   No current facility-administered medications for this visit.     REVIEW OF SYSTEMS:  [X]  denotes positive finding, [ ]  denotes negative finding Cardiac  Comments:  Chest pain or chest pressure:    Shortness of breath upon exertion:    Short of breath when lying flat:    Irregular heart rhythm:        Vascular    Pain in calf, thigh, or hip brought on by ambulation:    Pain in feet at night that wakes you up from your sleep:     Blood clot in your veins:    Leg swelling:  x       Pulmonary    Oxygen at home:    Productive cough:     Wheezing:         Neurologic    Sudden weakness in arms or legs:     Sudden numbness in arms or legs:     Sudden onset of difficulty speaking or slurred speech:    Temporary loss of vision in one eye:     Problems with dizziness:         Gastrointestinal    Blood in stool:     Vomited blood:         Genitourinary    Burning when urinating:     Blood in urine:        Psychiatric    Major depression:         Hematologic    Bleeding problems:    Problems with blood clotting too easily:        Skin    Rashes or ulcers:        Constitutional    Fever or chills:     PHYSICAL EXAM:   Vitals:   06/17/19 1424  BP: (!) 150/79  Pulse: 83  Resp: 20  Temp: 97.6 F (36.4 C)  SpO2: 100%  Weight: 181 lb 3.2 oz (82.2 kg)  Height: 5\' 11"  (1.803 m)    GENERAL: The patient is a well-nourished male, in no acute distress. The vital signs are documented above. CARDIAC: There is a regular rate and rhythm.  VASCULAR: I do not detect carotid bruits. He has palpable brachial pulses bilaterally.  I do not see any usable surface veins in either arm. He has no significant upper extremity swelling. He has significant 3+ edema of both lower extremities. PULMONARY: There is good air exchange  bilaterally without wheezing or rales. ABDOMEN: Soft and non-tender with normal pitched bowel sounds.  MUSCULOSKELETAL: There are no major deformities or cyanosis. NEUROLOGIC: No focal weakness or paresthesias are detected. SKIN: There are no ulcers or rashes noted. PSYCHIATRIC: The patient has a normal affect.  DATA:    BILATERAL UPPER EXTREMITY VEIN MAP: I have independently interpreted his upper extremity vein map.  On the right side the forearm and upper arm cephalic vein is small with thickened walls.  This is clearly not usable.  The basilic vein on the right is also small with evidence of sclerosis.  On the left side the upper arm and forearm cephalic vein are very small and not usable.  The basilic vein on the left likewise is small.  UPPER EXTREMITY ARTERIAL DUPLEX: I have independently interpreted his upper extremity arterial duplex scan.  On the right side there is a triphasic radial and ulnar signal.  The brachial artery measures 0.52 cm in diameter.  On the left side there is a triphasic radial and ulnar signal.  The brachial artery measures 0.53 cm in diameter.  LABS: I reviewed the labs from 04/22/2019.  GFR was 6.  Creatinine 9.39.

## 2019-06-19 ENCOUNTER — Other Ambulatory Visit (HOSPITAL_COMMUNITY): Payer: Self-pay | Admitting: *Deleted

## 2019-06-19 DIAGNOSIS — Z992 Dependence on renal dialysis: Secondary | ICD-10-CM | POA: Diagnosis not present

## 2019-06-19 DIAGNOSIS — N186 End stage renal disease: Secondary | ICD-10-CM | POA: Diagnosis not present

## 2019-06-19 DIAGNOSIS — T82868A Thrombosis of vascular prosthetic devices, implants and grafts, initial encounter: Secondary | ICD-10-CM | POA: Diagnosis not present

## 2019-06-22 ENCOUNTER — Inpatient Hospital Stay (HOSPITAL_COMMUNITY)
Admission: RE | Admit: 2019-06-22 | Discharge: 2019-06-22 | Disposition: A | Discharge status: Home / Self Care | Payer: Medicare Other | Source: Ambulatory Visit | Attending: Nephrology | Admitting: Nephrology

## 2019-06-22 ENCOUNTER — Encounter (HOSPITAL_COMMUNITY): Payer: Medicare Other

## 2019-06-22 DIAGNOSIS — R0602 Shortness of breath: Secondary | ICD-10-CM | POA: Insufficient documentation

## 2019-06-22 DIAGNOSIS — L299 Pruritus, unspecified: Secondary | ICD-10-CM | POA: Insufficient documentation

## 2019-06-22 DIAGNOSIS — Z992 Dependence on renal dialysis: Secondary | ICD-10-CM | POA: Insufficient documentation

## 2019-06-22 DIAGNOSIS — N2581 Secondary hyperparathyroidism of renal origin: Secondary | ICD-10-CM | POA: Insufficient documentation

## 2019-06-22 DIAGNOSIS — T783XXA Angioneurotic edema, initial encounter: Secondary | ICD-10-CM | POA: Insufficient documentation

## 2019-06-22 DIAGNOSIS — N189 Chronic kidney disease, unspecified: Secondary | ICD-10-CM | POA: Insufficient documentation

## 2019-06-22 DIAGNOSIS — D689 Coagulation defect, unspecified: Secondary | ICD-10-CM | POA: Insufficient documentation

## 2019-06-22 DIAGNOSIS — D509 Iron deficiency anemia, unspecified: Secondary | ICD-10-CM | POA: Insufficient documentation

## 2019-06-22 DIAGNOSIS — I1311 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, with stage 5 chronic kidney disease, or end stage renal disease: Secondary | ICD-10-CM | POA: Insufficient documentation

## 2019-06-22 DIAGNOSIS — J9500 Unspecified tracheostomy complication: Secondary | ICD-10-CM | POA: Insufficient documentation

## 2019-06-22 DIAGNOSIS — T829XXA Unspecified complication of cardiac and vascular prosthetic device, implant and graft, initial encounter: Secondary | ICD-10-CM | POA: Insufficient documentation

## 2019-06-22 DIAGNOSIS — D631 Anemia in chronic kidney disease: Secondary | ICD-10-CM | POA: Insufficient documentation

## 2019-06-22 DIAGNOSIS — J9690 Respiratory failure, unspecified, unspecified whether with hypoxia or hypercapnia: Secondary | ICD-10-CM | POA: Insufficient documentation

## 2019-06-22 DIAGNOSIS — R52 Pain, unspecified: Secondary | ICD-10-CM | POA: Insufficient documentation

## 2019-06-22 DIAGNOSIS — R197 Diarrhea, unspecified: Secondary | ICD-10-CM | POA: Insufficient documentation

## 2019-06-22 DIAGNOSIS — F10239 Alcohol dependence with withdrawal, unspecified: Secondary | ICD-10-CM | POA: Insufficient documentation

## 2019-06-22 DIAGNOSIS — R609 Edema, unspecified: Secondary | ICD-10-CM | POA: Insufficient documentation

## 2019-06-22 DIAGNOSIS — N186 End stage renal disease: Secondary | ICD-10-CM | POA: Diagnosis not present

## 2019-06-22 DIAGNOSIS — E872 Acidosis, unspecified: Secondary | ICD-10-CM | POA: Insufficient documentation

## 2019-06-23 DIAGNOSIS — I129 Hypertensive chronic kidney disease with stage 1 through stage 4 chronic kidney disease, or unspecified chronic kidney disease: Secondary | ICD-10-CM | POA: Diagnosis not present

## 2019-06-23 DIAGNOSIS — Z992 Dependence on renal dialysis: Secondary | ICD-10-CM | POA: Diagnosis not present

## 2019-06-23 DIAGNOSIS — N186 End stage renal disease: Secondary | ICD-10-CM | POA: Diagnosis not present

## 2019-06-24 DIAGNOSIS — Z992 Dependence on renal dialysis: Secondary | ICD-10-CM | POA: Diagnosis not present

## 2019-06-24 DIAGNOSIS — D631 Anemia in chronic kidney disease: Secondary | ICD-10-CM | POA: Diagnosis not present

## 2019-06-24 DIAGNOSIS — E876 Hypokalemia: Secondary | ICD-10-CM | POA: Diagnosis not present

## 2019-06-24 DIAGNOSIS — N186 End stage renal disease: Secondary | ICD-10-CM | POA: Diagnosis not present

## 2019-06-24 DIAGNOSIS — Z23 Encounter for immunization: Secondary | ICD-10-CM | POA: Diagnosis not present

## 2019-06-24 DIAGNOSIS — N2581 Secondary hyperparathyroidism of renal origin: Secondary | ICD-10-CM | POA: Diagnosis not present

## 2019-06-24 DIAGNOSIS — D509 Iron deficiency anemia, unspecified: Secondary | ICD-10-CM | POA: Diagnosis not present

## 2019-06-25 ENCOUNTER — Encounter (HOSPITAL_COMMUNITY): Payer: Self-pay | Admitting: Pharmacy Technician

## 2019-06-25 ENCOUNTER — Emergency Department (HOSPITAL_COMMUNITY): Payer: Medicare Other

## 2019-06-25 ENCOUNTER — Observation Stay (HOSPITAL_COMMUNITY)
Admission: EM | Admit: 2019-06-25 | Discharge: 2019-06-26 | Disposition: A | Discharge status: Home / Self Care | Payer: Medicare Other | Attending: Internal Medicine | Admitting: Internal Medicine

## 2019-06-25 ENCOUNTER — Observation Stay (HOSPITAL_COMMUNITY): Payer: Medicare Other

## 2019-06-25 ENCOUNTER — Other Ambulatory Visit: Payer: Self-pay

## 2019-06-25 ENCOUNTER — Observation Stay (HOSPITAL_COMMUNITY): Payer: Medicare Other | Admitting: Certified Registered"

## 2019-06-25 ENCOUNTER — Encounter (HOSPITAL_COMMUNITY)
Admission: EM | Disposition: A | Discharge status: Home / Self Care | Payer: Self-pay | Source: Home / Self Care | Attending: Emergency Medicine

## 2019-06-25 DIAGNOSIS — I248 Other forms of acute ischemic heart disease: Secondary | ICD-10-CM | POA: Insufficient documentation

## 2019-06-25 DIAGNOSIS — Z20828 Contact with and (suspected) exposure to other viral communicable diseases: Secondary | ICD-10-CM | POA: Diagnosis not present

## 2019-06-25 DIAGNOSIS — T82838A Hemorrhage of vascular prosthetic devices, implants and grafts, initial encounter: Secondary | ICD-10-CM

## 2019-06-25 DIAGNOSIS — I10 Essential (primary) hypertension: Secondary | ICD-10-CM | POA: Diagnosis not present

## 2019-06-25 DIAGNOSIS — Z7982 Long term (current) use of aspirin: Secondary | ICD-10-CM | POA: Insufficient documentation

## 2019-06-25 DIAGNOSIS — D62 Acute posthemorrhagic anemia: Secondary | ICD-10-CM | POA: Diagnosis not present

## 2019-06-25 DIAGNOSIS — Y831 Surgical operation with implant of artificial internal device as the cause of abnormal reaction of the patient, or of later complication, without mention of misadventure at the time of the procedure: Secondary | ICD-10-CM | POA: Insufficient documentation

## 2019-06-25 DIAGNOSIS — Z992 Dependence on renal dialysis: Secondary | ICD-10-CM

## 2019-06-25 DIAGNOSIS — Z87891 Personal history of nicotine dependence: Secondary | ICD-10-CM | POA: Diagnosis not present

## 2019-06-25 DIAGNOSIS — Z79899 Other long term (current) drug therapy: Secondary | ICD-10-CM | POA: Insufficient documentation

## 2019-06-25 DIAGNOSIS — N179 Acute kidney failure, unspecified: Secondary | ICD-10-CM | POA: Insufficient documentation

## 2019-06-25 DIAGNOSIS — N186 End stage renal disease: Secondary | ICD-10-CM | POA: Diagnosis not present

## 2019-06-25 DIAGNOSIS — Z8249 Family history of ischemic heart disease and other diseases of the circulatory system: Secondary | ICD-10-CM | POA: Diagnosis not present

## 2019-06-25 DIAGNOSIS — Z888 Allergy status to other drugs, medicaments and biological substances status: Secondary | ICD-10-CM | POA: Insufficient documentation

## 2019-06-25 DIAGNOSIS — Z419 Encounter for procedure for purposes other than remedying health state, unspecified: Secondary | ICD-10-CM

## 2019-06-25 DIAGNOSIS — E785 Hyperlipidemia, unspecified: Secondary | ICD-10-CM | POA: Diagnosis not present

## 2019-06-25 DIAGNOSIS — I120 Hypertensive chronic kidney disease with stage 5 chronic kidney disease or end stage renal disease: Secondary | ICD-10-CM | POA: Insufficient documentation

## 2019-06-25 DIAGNOSIS — D649 Anemia, unspecified: Secondary | ICD-10-CM | POA: Diagnosis not present

## 2019-06-25 DIAGNOSIS — R58 Hemorrhage, not elsewhere classified: Secondary | ICD-10-CM | POA: Diagnosis not present

## 2019-06-25 DIAGNOSIS — T829XXA Unspecified complication of cardiac and vascular prosthetic device, implant and graft, initial encounter: Secondary | ICD-10-CM | POA: Diagnosis not present

## 2019-06-25 DIAGNOSIS — I4581 Long QT syndrome: Secondary | ICD-10-CM | POA: Diagnosis not present

## 2019-06-25 DIAGNOSIS — I517 Cardiomegaly: Secondary | ICD-10-CM | POA: Diagnosis not present

## 2019-06-25 DIAGNOSIS — I2489 Other forms of acute ischemic heart disease: Secondary | ICD-10-CM

## 2019-06-25 HISTORY — PX: INSERTION OF DIALYSIS CATHETER: SHX1324

## 2019-06-25 LAB — PROTIME-INR
INR: 1.1 (ref 0.8–1.2)
Prothrombin Time: 13.7 seconds (ref 11.4–15.2)

## 2019-06-25 LAB — GLUCOSE, CAPILLARY: Glucose-Capillary: 108 mg/dL — ABNORMAL HIGH (ref 70–99)

## 2019-06-25 LAB — BASIC METABOLIC PANEL
Anion gap: 16 — ABNORMAL HIGH (ref 5–15)
Anion gap: 15 (ref 5–15)
BUN: 60 mg/dL — ABNORMAL HIGH (ref 8–23)
BUN: 62 mg/dL — ABNORMAL HIGH (ref 8–23)
CO2: 22 mmol/L (ref 22–32)
CO2: 23 mmol/L (ref 22–32)
Calcium: 8.7 mg/dL — ABNORMAL LOW (ref 8.9–10.3)
Calcium: 8.2 mg/dL — ABNORMAL LOW (ref 8.9–10.3)
Chloride: 100 mmol/L (ref 98–111)
Chloride: 100 mmol/L (ref 98–111)
Creatinine, Ser: 9.2 mg/dL — ABNORMAL HIGH (ref 0.61–1.24)
Creatinine, Ser: 9.25 mg/dL — ABNORMAL HIGH (ref 0.61–1.24)
GFR calc Af Amer: 6 mL/min — ABNORMAL LOW (ref >60)
GFR calc Af Amer: 6 mL/min — ABNORMAL LOW (ref >60)
GFR calc non Af Amer: 5 mL/min — ABNORMAL LOW (ref >60)
GFR calc non Af Amer: 5 mL/min — ABNORMAL LOW (ref >60)
Glucose, Bld: 114 mg/dL — ABNORMAL HIGH (ref 70–99)
Glucose, Bld: 97 mg/dL (ref 70–99)
Potassium: 3.4 mmol/L — ABNORMAL LOW (ref 3.5–5.1)
Potassium: 3.4 mmol/L — ABNORMAL LOW (ref 3.5–5.1)
Sodium: 138 mmol/L (ref 135–145)
Sodium: 138 mmol/L (ref 135–145)

## 2019-06-25 LAB — CBC WITH DIFFERENTIAL/PLATELET
Abs Immature Granulocytes: 0.06 10*3/uL (ref 0.00–0.07)
Basophils Absolute: 0 10*3/uL (ref 0.0–0.1)
Basophils Relative: 0 %
Eosinophils Absolute: 0.1 10*3/uL (ref 0.0–0.5)
Eosinophils Relative: 1 %
HCT: 19.6 % — ABNORMAL LOW (ref 39.0–52.0)
Hemoglobin: 6.5 g/dL — CL (ref 13.0–17.0)
Immature Granulocytes: 1 %
Lymphocytes Relative: 9 %
Lymphs Abs: 1 10*3/uL (ref 0.7–4.0)
MCH: 28.8 pg (ref 26.0–34.0)
MCHC: 33.2 g/dL (ref 30.0–36.0)
MCV: 86.7 fL (ref 80.0–100.0)
Monocytes Absolute: 0.6 10*3/uL (ref 0.1–1.0)
Monocytes Relative: 5 %
Neutro Abs: 9.7 10*3/uL — ABNORMAL HIGH (ref 1.7–7.7)
Neutrophils Relative %: 84 %
Platelets: 208 10*3/uL (ref 150–400)
RBC: 2.26 MIL/uL — ABNORMAL LOW (ref 4.22–5.81)
RDW: 15.5 % (ref 11.5–15.5)
WBC: 11.5 10*3/uL — ABNORMAL HIGH (ref 4.0–10.5)
nRBC: 0 % (ref 0.0–0.2)

## 2019-06-25 LAB — CBC
HCT: 18.8 % — ABNORMAL LOW (ref 39.0–52.0)
Hemoglobin: 6.1 g/dL — CL (ref 13.0–17.0)
MCH: 28.5 pg (ref 26.0–34.0)
MCHC: 32.4 g/dL (ref 30.0–36.0)
MCV: 87.9 fL (ref 80.0–100.0)
Platelets: 203 10*3/uL (ref 150–400)
RBC: 2.14 MIL/uL — ABNORMAL LOW (ref 4.22–5.81)
RDW: 15.5 % (ref 11.5–15.5)
WBC: 10.7 10*3/uL — ABNORMAL HIGH (ref 4.0–10.5)
nRBC: 0 % (ref 0.0–0.2)

## 2019-06-25 LAB — TROPONIN I (HIGH SENSITIVITY)
Troponin I (High Sensitivity): 168 ng/L (ref <18)
Troponin I (High Sensitivity): 122 ng/L (ref <18)

## 2019-06-25 LAB — POCT I-STAT 4, (NA,K, GLUC, HGB,HCT)
Glucose, Bld: 108 mg/dL — ABNORMAL HIGH (ref 70–99)
HCT: 25 % — ABNORMAL LOW (ref 39.0–52.0)
Hemoglobin: 8.5 g/dL — ABNORMAL LOW (ref 13.0–17.0)
Potassium: 3.6 mmol/L (ref 3.5–5.1)
Sodium: 138 mmol/L (ref 135–145)

## 2019-06-25 LAB — PREPARE RBC (CROSSMATCH)

## 2019-06-25 LAB — APTT: aPTT: 34 seconds (ref 24–36)

## 2019-06-25 LAB — SARS CORONAVIRUS 2 (TAT 6-24 HRS): SARS Coronavirus 2: NEGATIVE

## 2019-06-25 SURGERY — INSERTION OF DIALYSIS CATHETER
Anesthesia: General | Site: Neck | Laterality: Right

## 2019-06-25 MED ORDER — MIDAZOLAM HCL 5 MG/5ML IJ SOLN
INTRAMUSCULAR | Status: DC | PRN
  Administered 2019-06-25: 2 mg via INTRAVENOUS

## 2019-06-25 MED ORDER — CALCITRIOL 0.25 MCG PO CAPS
0.2500 ug | ORAL_CAPSULE | Freq: Every day | ORAL | Status: DC
  Administered 2019-06-25 – 2019-06-26 (×2): 0.25 ug via ORAL
  Filled 2019-06-25 (×2): qty 1

## 2019-06-25 MED ORDER — SODIUM CHLORIDE 0.9 % IV SOLN
INTRAVENOUS | Status: DC
  Administered 2019-06-25: 15:00:00 via INTRAVENOUS

## 2019-06-25 MED ORDER — ROCURONIUM BROMIDE 10 MG/ML (PF) SYRINGE
PREFILLED_SYRINGE | INTRAVENOUS | Status: AC
  Filled 2019-06-25: qty 20

## 2019-06-25 MED ORDER — HYDRALAZINE HCL 25 MG PO TABS
25.0000 mg | ORAL_TABLET | Freq: Three times a day (TID) | ORAL | Status: DC
  Administered 2019-06-25 – 2019-06-26 (×3): 25 mg via ORAL
  Filled 2019-06-25 (×3): qty 1

## 2019-06-25 MED ORDER — FUROSEMIDE 80 MG PO TABS
80.0000 mg | ORAL_TABLET | Freq: Every day | ORAL | Status: DC
  Administered 2019-06-25 – 2019-06-26 (×2): 80 mg via ORAL
  Filled 2019-06-25: qty 1
  Filled 2019-06-25: qty 4

## 2019-06-25 MED ORDER — SODIUM CHLORIDE 0.9 % IV SOLN
20.0000 ug | INTRAVENOUS | Status: AC
  Administered 2019-06-25: 20 ug via INTRAVENOUS
  Filled 2019-06-25: qty 5

## 2019-06-25 MED ORDER — DEXAMETHASONE SODIUM PHOSPHATE 10 MG/ML IJ SOLN
INTRAMUSCULAR | Status: DC | PRN
  Administered 2019-06-25: 5 mg via INTRAVENOUS

## 2019-06-25 MED ORDER — ACETAMINOPHEN 325 MG PO TABS: 650.0000 mg | ORAL_TABLET | Freq: Four times a day (QID) | ORAL | Status: DC | PRN

## 2019-06-25 MED ORDER — FENTANYL CITRATE (PF) 250 MCG/5ML IJ SOLN
INTRAMUSCULAR | Status: DC | PRN
  Administered 2019-06-25 (×2): 50 ug via INTRAVENOUS

## 2019-06-25 MED ORDER — SUCCINYLCHOLINE CHLORIDE 200 MG/10ML IV SOSY
PREFILLED_SYRINGE | INTRAVENOUS | Status: AC
  Filled 2019-06-25: qty 20

## 2019-06-25 MED ORDER — FENTANYL CITRATE (PF) 100 MCG/2ML IJ SOLN
INTRAMUSCULAR | Status: AC
  Filled 2019-06-25: qty 2

## 2019-06-25 MED ORDER — GLYCOPYRROLATE PF 0.2 MG/ML IJ SOSY
PREFILLED_SYRINGE | INTRAMUSCULAR | Status: AC
  Filled 2019-06-25: qty 2

## 2019-06-25 MED ORDER — PROPOFOL 10 MG/ML IV BOLUS
INTRAVENOUS | Status: DC | PRN
  Administered 2019-06-25: 150 mg via INTRAVENOUS

## 2019-06-25 MED ORDER — CALCIUM ACETATE (PHOS BINDER) 667 MG PO CAPS
667.0000 mg | ORAL_CAPSULE | Freq: Three times a day (TID) | ORAL | Status: DC
  Administered 2019-06-25 – 2019-06-26 (×3): 667 mg via ORAL
  Filled 2019-06-25 (×5): qty 1

## 2019-06-25 MED ORDER — SODIUM CHLORIDE 0.9 % IV SOLN
10.0000 mL/h | Freq: Once | INTRAVENOUS | Status: AC
  Administered 2019-06-25: 10 mL/h via INTRAVENOUS

## 2019-06-25 MED ORDER — FENTANYL CITRATE (PF) 100 MCG/2ML IJ SOLN
25.0000 ug | INTRAMUSCULAR | Status: DC | PRN
  Administered 2019-06-25: 25 ug via INTRAVENOUS

## 2019-06-25 MED ORDER — METOPROLOL TARTRATE 100 MG PO TABS
100.0000 mg | ORAL_TABLET | Freq: Two times a day (BID) | ORAL | Status: DC
  Administered 2019-06-25 – 2019-06-26 (×3): 100 mg via ORAL
  Filled 2019-06-25 (×2): qty 1
  Filled 2019-06-25: qty 4

## 2019-06-25 MED ORDER — HEPARIN SODIUM (PORCINE) 1000 UNIT/ML IJ SOLN
INTRAMUSCULAR | Status: DC | PRN
  Administered 2019-06-25 – 2019-06-26 (×2): 3000 [IU]

## 2019-06-25 MED ORDER — SODIUM CHLORIDE 0.9 % IV SOLN: 20.0000 ug | Freq: Once | INTRAVENOUS | Status: DC

## 2019-06-25 MED ORDER — FENTANYL CITRATE (PF) 250 MCG/5ML IJ SOLN
INTRAMUSCULAR | Status: AC
  Filled 2019-06-25: qty 5

## 2019-06-25 MED ORDER — LIDOCAINE 2% (20 MG/ML) 5 ML SYRINGE
INTRAMUSCULAR | Status: AC
  Filled 2019-06-25: qty 20

## 2019-06-25 MED ORDER — LIDOCAINE 2% (20 MG/ML) 5 ML SYRINGE
INTRAMUSCULAR | Status: DC | PRN
  Administered 2019-06-25: 60 mg via INTRAVENOUS

## 2019-06-25 MED ORDER — 0.9 % SODIUM CHLORIDE (POUR BTL) OPTIME
TOPICAL | Status: DC | PRN
  Administered 2019-06-25: 1000 mL

## 2019-06-25 MED ORDER — CEFAZOLIN SODIUM-DEXTROSE 2-3 GM-%(50ML) IV SOLR
INTRAVENOUS | Status: DC | PRN
  Administered 2019-06-25: 2 g via INTRAVENOUS

## 2019-06-25 MED ORDER — ONDANSETRON HCL 4 MG/2ML IJ SOLN: 4.0000 mg | Freq: Once | INTRAMUSCULAR | Status: DC | PRN

## 2019-06-25 MED ORDER — HEPARIN SODIUM (PORCINE) 1000 UNIT/ML IJ SOLN
INTRAMUSCULAR | Status: AC
  Filled 2019-06-25: qty 1

## 2019-06-25 MED ORDER — OXYCODONE HCL 5 MG PO TABS: 5.0000 mg | ORAL_TABLET | Freq: Once | ORAL | Status: DC | PRN

## 2019-06-25 MED ORDER — ONDANSETRON HCL 4 MG/2ML IJ SOLN
INTRAMUSCULAR | Status: AC
  Filled 2019-06-25: qty 6

## 2019-06-25 MED ORDER — ACETAMINOPHEN 650 MG RE SUPP: 650.0000 mg | Freq: Four times a day (QID) | RECTAL | Status: DC | PRN

## 2019-06-25 MED ORDER — NITROGLYCERIN 0.4 MG SL SUBL
0.4000 mg | SUBLINGUAL_TABLET | SUBLINGUAL | Status: DC | PRN
  Filled 2019-06-25: qty 1

## 2019-06-25 MED ORDER — DEXAMETHASONE SODIUM PHOSPHATE 10 MG/ML IJ SOLN
INTRAMUSCULAR | Status: AC
  Filled 2019-06-25: qty 3

## 2019-06-25 MED ORDER — ONDANSETRON HCL 4 MG/2ML IJ SOLN
INTRAMUSCULAR | Status: DC | PRN
  Administered 2019-06-25: 4 mg via INTRAVENOUS

## 2019-06-25 MED ORDER — ONDANSETRON HCL 4 MG/2ML IJ SOLN: 4.0000 mg | Freq: Four times a day (QID) | INTRAMUSCULAR | Status: DC | PRN

## 2019-06-25 MED ORDER — AMLODIPINE BESYLATE 10 MG PO TABS
10.0000 mg | ORAL_TABLET | Freq: Every day | ORAL | Status: DC
  Administered 2019-06-25 – 2019-06-26 (×2): 10 mg via ORAL
  Filled 2019-06-25: qty 1
  Filled 2019-06-25: qty 2

## 2019-06-25 MED ORDER — CEFAZOLIN SODIUM-DEXTROSE 2-4 GM/100ML-% IV SOLN
INTRAVENOUS | Status: AC
  Filled 2019-06-25: qty 100

## 2019-06-25 MED ORDER — PHENYLEPHRINE 40 MCG/ML (10ML) SYRINGE FOR IV PUSH (FOR BLOOD PRESSURE SUPPORT)
PREFILLED_SYRINGE | INTRAVENOUS | Status: AC
  Filled 2019-06-25: qty 30

## 2019-06-25 MED ORDER — SODIUM CHLORIDE 0.9 % IV SOLN
INTRAVENOUS | Status: DC | PRN
  Administered 2019-06-25: 500 mL

## 2019-06-25 MED ORDER — LIDOCAINE-EPINEPHRINE (PF) 1 %-1:200000 IJ SOLN
INTRAMUSCULAR | Status: DC | PRN
  Administered 2019-06-25: 8 mL

## 2019-06-25 MED ORDER — CHLORHEXIDINE GLUCONATE CLOTH 2 % EX PADS
6.0000 | MEDICATED_PAD | Freq: Every day | CUTANEOUS | Status: DC
  Administered 2019-06-26: 6 via TOPICAL

## 2019-06-25 MED ORDER — MIDAZOLAM HCL 2 MG/2ML IJ SOLN
INTRAMUSCULAR | Status: AC
  Filled 2019-06-25: qty 2

## 2019-06-25 MED ORDER — NITROGLYCERIN 0.4 MG SL SUBL
0.4000 mg | SUBLINGUAL_TABLET | SUBLINGUAL | Status: DC | PRN
  Administered 2019-06-25 (×3): 0.4 mg via SUBLINGUAL

## 2019-06-25 MED ORDER — OXYCODONE HCL 5 MG/5ML PO SOLN: 5.0000 mg | Freq: Once | ORAL | Status: DC | PRN

## 2019-06-25 MED ORDER — EPHEDRINE 5 MG/ML INJ
INTRAVENOUS | Status: AC
  Filled 2019-06-25: qty 20

## 2019-06-25 MED ORDER — SODIUM CHLORIDE 0.9 % IV SOLN
INTRAVENOUS | Status: AC
  Filled 2019-06-25: qty 1.2

## 2019-06-25 MED ORDER — ONDANSETRON HCL 4 MG PO TABS: 4.0000 mg | ORAL_TABLET | Freq: Four times a day (QID) | ORAL | Status: DC | PRN

## 2019-06-25 MED ORDER — TRANEXAMIC ACID 1000 MG/10ML IV SOLN
500.0000 mg | Freq: Once | INTRAVENOUS | Status: AC
  Administered 2019-06-25: 500 mg via TOPICAL
  Filled 2019-06-25: qty 10

## 2019-06-25 SURGICAL SUPPLY — 40 items
ADH SKN CLS APL DERMABOND .7 (GAUZE/BANDAGES/DRESSINGS) ×1
BAG DECANTER FOR FLEXI CONT (MISCELLANEOUS) ×2 IMPLANT
BIOPATCH RED 1 DISK 7.0 (GAUZE/BANDAGES/DRESSINGS) ×2 IMPLANT
CATH PALINDROME RT-P 15FX19CM (CATHETERS) ×1 IMPLANT
CATH PALINDROME RT-P 15FX23CM (CATHETERS) IMPLANT
CATH PALINDROME RT-P 15FX28CM (CATHETERS) IMPLANT
CATH PALINDROME RT-P 15FX55CM (CATHETERS) IMPLANT
COVER PROBE W GEL 5X96 (DRAPES) ×2 IMPLANT
COVER SURGICAL LIGHT HANDLE (MISCELLANEOUS) ×2 IMPLANT
COVER WAND RF STERILE (DRAPES) ×1 IMPLANT
DECANTER SPIKE VIAL GLASS SM (MISCELLANEOUS) ×1 IMPLANT
DERMABOND ADVANCED (GAUZE/BANDAGES/DRESSINGS) ×1
DERMABOND ADVANCED .7 DNX12 (GAUZE/BANDAGES/DRESSINGS) ×1 IMPLANT
DRAPE C-ARM 42X72 X-RAY (DRAPES) ×2 IMPLANT
DRAPE CHEST BREAST 15X10 FENES (DRAPES) ×2 IMPLANT
GAUZE 4X4 16PLY RFD (DISPOSABLE) ×2 IMPLANT
GLOVE BIO SURGEON STRL SZ7.5 (GLOVE) ×2 IMPLANT
GOWN STRL REUS W/ TWL LRG LVL3 (GOWN DISPOSABLE) ×1 IMPLANT
GOWN STRL REUS W/ TWL XL LVL3 (GOWN DISPOSABLE) ×1 IMPLANT
GOWN STRL REUS W/TWL LRG LVL3 (GOWN DISPOSABLE) ×4
GOWN STRL REUS W/TWL XL LVL3 (GOWN DISPOSABLE) ×2
KIT BASIN OR (CUSTOM PROCEDURE TRAY) ×2 IMPLANT
KIT TURNOVER KIT B (KITS) ×2 IMPLANT
NDL 18GX1X1/2 (RX/OR ONLY) (NEEDLE) ×1 IMPLANT
NDL HYPO 25GX1X1/2 BEV (NEEDLE) ×1 IMPLANT
NEEDLE 18GX1X1/2 (RX/OR ONLY) (NEEDLE) ×2 IMPLANT
NEEDLE HYPO 25GX1X1/2 BEV (NEEDLE) ×2 IMPLANT
NS IRRIG 1000ML POUR BTL (IV SOLUTION) ×2 IMPLANT
PACK SURGICAL SETUP 50X90 (CUSTOM PROCEDURE TRAY) ×2 IMPLANT
PAD ARMBOARD 7.5X6 YLW CONV (MISCELLANEOUS) ×4 IMPLANT
SOAP 2 % CHG 4 OZ (WOUND CARE) ×2 IMPLANT
SUT ETHILON 3 0 PS 1 (SUTURE) ×2 IMPLANT
SUT MNCRL AB 4-0 PS2 18 (SUTURE) ×2 IMPLANT
SYR 10ML LL (SYRINGE) ×2 IMPLANT
SYR 20ML LL LF (SYRINGE) ×4 IMPLANT
SYR 5ML LL (SYRINGE) ×2 IMPLANT
SYR CONTROL 10ML LL (SYRINGE) ×2 IMPLANT
TOWEL GREEN STERILE (TOWEL DISPOSABLE) ×2 IMPLANT
TOWEL GREEN STERILE FF (TOWEL DISPOSABLE) ×4 IMPLANT
WATER STERILE IRR 1000ML POUR (IV SOLUTION) ×2 IMPLANT

## 2019-06-25 NOTE — Consult Note (Signed)
Renal Service Consult Note [facility]  Jesus Ayala 06/25/2019 [place] Requesting Physician:  Dr Reesa Chew  Reason for Consult:  ESRD pt w/ anemia and bleeding from HD cath HPI: The patient is a 68 y.o. year-old with hx of HTN, anemia and CKD presented to ED for bleeding from HD cath site.  2nd time this week. Catheter was placed last week on Friday 8/28. He started HD this Monday and had his 2nd Rx yesterday on Wed, had 3/4 session then "passed out" according to patient. They laid him back and he regained consciousness. In ED the exit site was treated and bleeding has improved. Asked to see for ESRD.   Pt denies any SOB, cough or CP, no abd pain.  Has appt for AVF/ AVG in 12 days.     ROS  denies CP  no joint pain   no HA  no blurry vision  no rash  no diarrhea  no nausea/ vomiting    Past Medical History  Past Medical History:  Diagnosis Date  . Anemia   . CKD (chronic kidney disease), stage III ([place])   . HLD (hyperlipidemia)   . Hypertension    Past Surgical History  Past Surgical History:  Procedure Laterality Date  . HERNIA REPAIR     Family History  Family History  Problem Relation Age of Onset  . Lung cancer Father   . Hypertension Brother    Social History  reports that he has quit smoking. He has never used smokeless tobacco. He reports current alcohol use. He reports that he does not use drugs. Allergies  Allergies  Allergen Reactions  . Enalapril Swelling    ANGIOEDEMA of lips ANGIOEDEMA of lips ANGIOEDEMA of lips ANGIOEDEMA of lips   Home medications Prior to Admission medications   Medication Sig Start Date End Date Taking? Authorizing Provider  amLODipine (NORVASC) 10 MG tablet Take 10 mg by mouth daily. 04/12/19  Yes [provider]  aspirin 81 MG chewable tablet Chew 81 mg by mouth daily.   Yes [provider]  calcitRIOL (ROCALTROL) 0.25 MCG capsule Take 0.25 mcg by mouth daily. 06/12/19  Yes [provider]  calcium acetate (PHOSLO) 667 MG capsule Take 1 capsule (667 mg total) by mouth 3 (three) times daily with meals. 04/22/19  Yes Lavina Hamman, MD  furosemide (LASIX) 80 MG tablet Take 80 mg by mouth daily. 01/22/19  Yes [provider]  hydrALAZINE (APRESOLINE) 25 MG tablet Take 1 tablet (25 mg total) by mouth 3 (three) times daily. 04/22/19  Yes Lavina Hamman, MD  metoprolol tartrate (LOPRESSOR) 100 MG tablet Take 100 mg by mouth 2 (two) times a day. 04/12/19  Yes [provider]  nitroGLYCERIN (NITROSTAT) 0.4 MG SL tablet Place 0.4 mg under the tongue every 5 (five) minutes as needed for chest pain.  03/27/19  Yes [provider]   Liver Function Tests No results for input(s): AST, ALT, ALKPHOS, BILITOT, PROT, ALBUMIN in the last 168 hours. No results for input(s): LIPASE, AMYLASE in the last 168 hours. CBC Recent Labs  Lab 06/25/19 0144 06/25/19 0425  WBC 11.5* 10.7*  NEUTROABS 9.7*  --   HGB 6.5* 6.1*  HCT 19.6* 18.8*  MCV 86.7 87.9  PLT 208 [id number]   Basic Metabolic Panel Recent Labs  Lab 06/25/19 0144 06/25/19 0425  NA 138 138  K 3.4* 3.4*  CL 100 100  CO2 22 23  GLUCOSE 114* 97  BUN  60* 62*  CREATININE 9.20* 9.25*  CALCIUM 8.7* 8.2*   Iron/TIBC/Ferritin/ %Sat    Component Value Date/Time   IRON 40 (L) 06/08/2019 1409   TIBC 281 06/08/2019 1409   FERRITIN 433 (H) 06/08/2019 1409   IRONPCTSAT 14 (L) 06/08/2019 1409    Vitals:   06/25/19 0745 06/25/19 0830 06/25/19 1045 06/25/19 1046  BP: 138/82 139/81 (!) 155/79 (!) 155/79  Pulse: 83 81 88 87  Resp: 14 12 17 13   Temp:    98.5 F (36.9 C)  TempSrc:    Oral  SpO2: 98% 98% 98%   Weight:      Height:        Exam Gen alert, no distress No rash, cyanosis or gangrene Sclera anicteric, throat clear  No jvd or bruits, R chest TDC w/ some drying up blood at exit site Chest clear bilat to bases, no rales or wheezing RRR no MRG Abd soft ntnd no mass or ascites +bs GU normal  male MS no joint effusions or deformity Ext 2+ diffuse pretib edema Neuro is alert, Ox 3, NF    Home meds:  - amlodipine 10/ furosemide 80 qd/ hydralazine 25 tid/ metoprolol 100 bid  - sl ntg prn/ aspirin 81 qd  - calc acetate ac tid  - prn's/ vitamins/ supplements     Outpt HD: [place] MWF  3h  300/600  77kg (needs lowering)  TDC  Hep 5000     Assessment/ Plan: 1. Bleeding TDC - placed 8/28, treated in ED. Will add DDAVP x 1 as patient is very new to HD.  2. ESRD - new start has had HD x 2 this week. Has appt for AVF on 9/15. Passed out on his 2nd session about 3/4 into session. Has sig LE edema still but CXR and lungs clear.  HD tomorrow, 2 L UF as tol.  3. Vol - sig LE edema, BP's high normal, echo 03/2019 was normal. See #2 above, be cautious w/ vol removal.  4. HTN - cont meds, lower as needed to get vol down 5. Anemia ABL/ CKD - getting 2u prbc this am in ED 6. MBD ckd - cont meds      Kelly Splinter  MD 06/25/2019, 11:50 AM

## 2019-06-25 NOTE — Anesthesia Preprocedure Evaluation (Addendum)
Anesthesia Evaluation  Patient identified by MRN, date of birth, ID band Patient awake    Reviewed: Allergy & Precautions, NPO status , Patient's Chart, lab work & pertinent test results  History of Anesthesia Complications Negative for: history of anesthetic complications  Airway Mallampati: II  TM Distance: >3 FB Neck ROM: Full    Dental  (+) Dental Advisory Given   Pulmonary former smoker,    Pulmonary exam normal        Cardiovascular hypertension, Pt. on medications and Pt. on home beta blockers Normal cardiovascular exam   '20 TTE - EF 60-65%. Left ventricular diastolic Doppler parameters are consistent with impaired relaxation. Left atrial size was mildly dilated    Neuro/Psych negative neurological ROS  negative psych ROS   GI/Hepatic negative GI ROS, Neg liver ROS,   Endo/Other  negative endocrine ROS  Renal/GU CRF and ARFRenal disease     Musculoskeletal negative musculoskeletal ROS (+)   Abdominal   Peds  Hematology  (+) anemia ,   Anesthesia Other Findings   Reproductive/Obstetrics                            Anesthesia Physical Anesthesia Plan  ASA: IV  Anesthesia Plan: General   Post-op Pain Management:    Induction: Intravenous  PONV Risk Score and Plan: 2 and Treatment may vary due to age or medical condition, Ondansetron and Dexamethasone  Airway Management Planned: Oral ETT  Additional Equipment: None  Intra-op Plan:   Post-operative Plan: Extubation in OR  Informed Consent: I have reviewed the patients History and Physical, chart, labs and discussed the procedure including the risks, benefits and alternatives for the proposed anesthesia with the patient or authorized representative who has indicated his/her understanding and acceptance.     Dental advisory given  Plan Discussed with: CRNA and Anesthesiologist  Anesthesia Plan Comments:         Anesthesia Quick Evaluation

## 2019-06-25 NOTE — ED Notes (Signed)
Pt called out stating he was bleeding and now has cp. Small amount of blood noted trickling through the bandage. ekg obtained and MD made aware.

## 2019-06-25 NOTE — Anesthesia Postprocedure Evaluation (Signed)
Anesthesia Post Note  Patient: Jesus Ayala  Procedure(s) Performed: INSERTION OF TUNNEL DIALYSIS CATHETER (Right Neck)     Patient location during evaluation: PACU Anesthesia Type: General Level of consciousness: awake and alert Pain management: pain level controlled Vital Signs Assessment: post-procedure vital signs reviewed and stable Respiratory status: spontaneous breathing, nonlabored ventilation, respiratory function stable and patient connected to nasal cannula oxygen Cardiovascular status: blood pressure returned to baseline and stable Postop Assessment: no apparent nausea or vomiting Anesthetic complications: no    Last Vitals:  Vitals:   06/25/19 1647 06/25/19 1725  BP: (!) 138/100 (!) 155/88  Pulse: 78 81  Resp: 14 18  Temp:  36.8 C  SpO2: 97% 97%    Last Pain:  Vitals:   06/25/19 1725  TempSrc: Oral  PainSc:                  Jesus Ayala

## 2019-06-25 NOTE — Consult Note (Addendum)
Hospital Consult    Reason for Consult: Bleeding from tunneled dialysis catheter site Referring Physician: Dr.Amin MRN #:  [id number]  History of Present Illness: This is a 68 y.o. male with history of end-stage renal disease.  He is scheduled for left arm AV graft with Dr. Scot Dock in the near future.  He underwent placement of right IJ tunneled dialysis catheter last Friday with [facility].  After dialysis yesterday was noted to be bleeding was brought by EMS to the ER last night.  He has continued to bleed and is required transfusion.  We are now consulted for bleeding tunneled dialysis catheter placed by nephrology.  Past Medical History:  Diagnosis Date  . Anemia   . CKD (chronic kidney disease), stage III ([place])   . HLD (hyperlipidemia)   . Hypertension     Past Surgical History:  Procedure Laterality Date  . HERNIA REPAIR      Allergies  Allergen Reactions  . Enalapril Swelling    ANGIOEDEMA of lips ANGIOEDEMA of lips ANGIOEDEMA of lips ANGIOEDEMA of lips    Prior to Admission medications   Medication Sig Start Date End Date Taking? Authorizing Provider  amLODipine (NORVASC) 10 MG tablet Take 10 mg by mouth daily. 04/12/19  Yes [provider]  aspirin 81 MG chewable tablet Chew 81 mg by mouth daily.   Yes [provider]  calcitRIOL (ROCALTROL) 0.25 MCG capsule Take 0.25 mcg by mouth daily. 06/12/19  Yes [provider]  calcium acetate (PHOSLO) 667 MG capsule Take 1 capsule (667 mg total) by mouth 3 (three) times daily with meals. 04/22/19  Yes Lavina Hamman, MD  furosemide (LASIX) 80 MG tablet Take 80 mg by mouth daily. 01/22/19  Yes [provider]  hydrALAZINE (APRESOLINE) 25 MG tablet Take 1 tablet (25 mg total) by mouth 3 (three) times daily. 04/22/19  Yes Lavina Hamman, MD  metoprolol tartrate (LOPRESSOR) 100 MG tablet Take 100 mg by mouth 2 (two) times a day. 04/12/19  Yes [provider]  nitroGLYCERIN (NITROSTAT) 0.4  MG SL tablet Place 0.4 mg under the tongue every 5 (five) minutes as needed for chest pain.  03/27/19  Yes [provider]    Social History   Socioeconomic History  . Marital status: Married    Spouse name: Not on file  . Number of children: Not on file  . Years of education: Not on file  . Highest education level: Not on file  Occupational History  . Not on file  Social Needs  . Financial resource strain: Not on file  . Food insecurity    Worry: Not on file    Inability: Not on file  . Transportation needs    Medical: Not on file    Non-medical: Not on file  Tobacco Use  . Smoking status: Former Research scientist (life sciences)  . Smokeless tobacco: Never Used  Substance and Sexual Activity  . Alcohol use: Yes  . Drug use: No  . Sexual activity: Not on file  Lifestyle  . Physical activity    Days per week: Not on file    Minutes per session: Not on file  . Stress: Not on file  Relationships  . Social Herbalist on phone: Not on file    Gets together: Not on file    Attends religious service: Not on file    Active member of club or organization: Not on file    Attends meetings of clubs or organizations: Not  on file    Relationship status: Not on file  . Intimate partner violence    Fear of current or ex partner: Not on file    Emotionally abused: Not on file    Physically abused: Not on file    Forced sexual activity: Not on file  Other Topics Concern  . Not on file  Social History Narrative  . Not on file    Family History  Problem Relation Age of Onset  . Lung cancer Father   . Hypertension Brother     ROS: bleeding tdc  Physical Examination  Vitals:   06/25/19 0745 06/25/19 0830  BP: 138/82 139/81  Pulse: 83 81  Resp: 14 12  Temp:    SpO2: 98% 98%   Body mass index is 24.41 kg/m.  General:  nad HENT: WNL, normocephalic Pulmonary: normal non-labored breathing Cardiac: palpable radial pulses bilaterally Abdomen: soft, ntnd Extremities: Oozing  from right IJ tunneled dialysis catheter site on the right chest Neurologic: A&O X 3; Appropriate Affect ; SENSATION: normal; MOTOR FUNCTION:  moving all extremities equally. Speech is fluent/normal  CBC    Component Value Date/Time   WBC 10.7 (H) 06/25/2019 0425   RBC 2.14 (L) 06/25/2019 0425   HGB 6.1 (LL) 06/25/2019 0425   HCT 18.8 (L) 06/25/2019 0425   PLT 203 06/25/2019 0425   MCV 87.9 06/25/2019 0425   MCH 28.5 06/25/2019 0425   MCHC 32.4 06/25/2019 0425   RDW 15.5 06/25/2019 0425   LYMPHSABS 1.0 06/25/2019 0144   MONOABS 0.6 06/25/2019 0144   EOSABS 0.1 06/25/2019 0144   BASOSABS 0.0 06/25/2019 0144    BMET    Component Value Date/Time   NA 138 06/25/2019 0425   K 3.4 (L) 06/25/2019 0425   CL 100 06/25/2019 0425   CO2 23 06/25/2019 0425   GLUCOSE 97 06/25/2019 0425   BUN 62 (H) 06/25/2019 0425   CREATININE 9.25 (H) 06/25/2019 0425   CALCIUM 8.2 (L) 06/25/2019 0425   CALCIUM 8.7 04/21/2019 0646   GFRNONAA 5 (L) 06/25/2019 0425   GFRAA 6 (L) 06/25/2019 0425    COAGS: Lab Results  Component Value Date   INR 1.0 04/19/2019   INR 1.27 12/26/2009   INR 1.27 12/06/2009     ASSESSMENT/PLAN: This is a 68 y.o. male here with bleeding tunneled dialysis catheter.  After I confirmed the patient does not have allergies I sterilely prepped his chest area and cleaned of his catheter with DuraPrep.  I then applied Dermabond to both the suture sites and the chest site at the level of the catheter.  Hemostasis appeared to be obtained and I will recheck later.  If we cannot get hemostasis catheter will need to be replaced.  Cimone Fahey C. Donzetta Matters, MD [facility] [employer] Office: [phone] Pager: [phone]  Addendum: I have reevaluated patient continues to bleed.  Pressure has been held with persistent oozing.  Will plan to replace tunneled dialysis catheter in OR this afternoon.  I discussed with the patient and family member present and they agreed to  proceed.  Servando Snare

## 2019-06-25 NOTE — ED Notes (Signed)
Pt's blood consent form signed and original placed at bedside. Copy placed in medical records.

## 2019-06-25 NOTE — Plan of Care (Signed)
  Problem: Activity: Goal: Risk for activity intolerance will decrease Outcome: Progressing   Problem: Safety: Goal: Ability to remain free from injury will improve Outcome: Progressing   

## 2019-06-25 NOTE — ED Triage Notes (Signed)
Pt arrives via ems from around insertion site of dialysis port. Port placed on Friday. Bleeding started today. Pt states this is the second time it has bled today. 162/90, HR 80, RR 18, 96% RA, 98.58F.

## 2019-06-25 NOTE — Transfer of Care (Signed)
Immediate Anesthesia Transfer of Care Note  Patient: Jesus Ayala  Procedure(s) Performed: INSERTION OF TUNNEL DIALYSIS CATHETER (Right Neck)  Patient Location: PACU  Anesthesia Type:General  Level of Consciousness: awake, alert , oriented and patient cooperative  Airway & Oxygen Therapy: Patient Spontanous Breathing and Patient connected to face mask oxygen  Post-op Assessment: Report given to RN and Post -op Vital signs reviewed and stable  Post vital signs: Reviewed and stable  Last Vitals:  Vitals Value Taken Time  BP 130/79 06/25/19 1532  Temp    Pulse 82 06/25/19 1535  Resp 19 06/25/19 1534  SpO2 100 % 06/25/19 1535  Vitals shown include unvalidated device data.  Last Pain:  Vitals:   06/25/19 1442  TempSrc: Oral  PainSc:          Complications: No apparent anesthesia complications

## 2019-06-25 NOTE — Anesthesia Procedure Notes (Signed)
Procedure Name: Intubation Date/Time: 06/25/2019 3:00 PM Performed by: Cleda Daub, CRNA Pre-anesthesia Checklist: Patient identified, Emergency Drugs available, Suction available and Patient being monitored Patient Re-evaluated:Patient Re-evaluated prior to induction Oxygen Delivery Method: Circle system utilized Preoxygenation: Pre-oxygenation with 100% oxygen Induction Type: IV induction and Rapid sequence Laryngoscope Size: Miller and 2 Grade View: Grade I Tube type: Oral Tube size: 7.5 mm Number of attempts: 1 Airway Equipment and Method: Stylet Placement Confirmation: ETT inserted through vocal cords under direct vision,  positive ETCO2 and breath sounds checked- equal and bilateral Secured at: 23 cm Tube secured with: Tape Dental Injury: Teeth and Oropharynx as per pre-operative assessment

## 2019-06-25 NOTE — ED Notes (Signed)
PAGED VASCULAR PER RN

## 2019-06-25 NOTE — ED Provider Notes (Signed)
[facility] EMERGENCY DEPARTMENT Provider Note   CSN: [id number] Arrival date & time: 06/25/19  0021     History   Chief Complaint Chief Complaint  Patient presents with  . Post-op Problem    HPI Jesus Ayala is a 68 y.o. male.  HPI: A 68 year old patient with a history of hypertension presents for evaluation of chest pain. Initial onset of pain was less than one hour ago. The patient's chest pain is described as heaviness/pressure/tightness and is not worse with exertion. The patient's chest pain is middle- or left-sided, is not well-localized, is not sharp and does not radiate to the arms/jaw/neck. The patient does not complain of nausea and denies diaphoresis. The patient has no history of stroke, has no history of peripheral artery disease, has not smoked in the past 90 days, denies any history of treated diabetes, has no relevant family history of coronary artery disease (first degree relative at less than age 41), has no history of hypercholesterolemia and does not have an elevated BMI (>=30).   Patient primarily in the ER for bleeding from his right-sided tunneled catheter.  Tunneled cath was placed for dialysis and patient had a session earlier today.  He reports that he spontaneously started bleeding from the site about 2 hours ago.  After he came to the ER he started having some chest discomfort that is intermittent, but lasting about 10 or 15 minutes.  Chest pain is described as pressure type pain that is at the top of his chest and it is nonradiating and there is no associated nausea, shortness of breath.  He has had similar chest pain in the past.  HPI  Past Medical History:  Diagnosis Date  . Anemia   . CKD (chronic kidney disease), stage III ([place])   . HLD (hyperlipidemia)   . Hypertension     Patient Active Problem List   Diagnosis Date Noted  . Acute blood loss anemia 06/25/2019  . ESRD (end stage renal disease) ([place]) 06/25/2019  .  Complication of dialysis access insertion 06/25/2019  . Acute renal failure superimposed on stage 3 chronic kidney disease ([place]) 04/19/2019  . Chest pain 04/19/2019  . Hypokalemia 04/19/2019  . Normocytic anemia 04/19/2019  . HLD (hyperlipidemia)   . Hypertension     Past Surgical History:  Procedure Laterality Date  . HERNIA REPAIR          Home Medications    Prior to Admission medications   Medication Sig Start Date End Date Taking? Authorizing Provider  amLODipine (NORVASC) 10 MG tablet Take 10 mg by mouth daily. 04/12/19  Yes [provider]  aspirin 81 MG chewable tablet Chew 81 mg by mouth daily.   Yes [provider]  calcitRIOL (ROCALTROL) 0.25 MCG capsule Take 0.25 mcg by mouth daily. 06/12/19  Yes [provider]  calcium acetate (PHOSLO) 667 MG capsule Take 1 capsule (667 mg total) by mouth 3 (three) times daily with meals. 04/22/19  Yes Lavina Hamman, MD  furosemide (LASIX) 80 MG tablet Take 80 mg by mouth daily. 01/22/19  Yes [provider]  hydrALAZINE (APRESOLINE) 25 MG tablet Take 1 tablet (25 mg total) by mouth 3 (three) times daily. 04/22/19  Yes Lavina Hamman, MD  metoprolol tartrate (LOPRESSOR) 100 MG tablet Take 100 mg by mouth 2 (two) times a day. 04/12/19  Yes [provider]  nitroGLYCERIN (NITROSTAT) 0.4 MG SL tablet Place 0.4 mg under the tongue every 5 (five) minutes as  needed for chest pain.  03/27/19  Yes [provider]    Family History Family History  Problem Relation Age of Onset  . Lung cancer Father   . Hypertension Brother     Social History Social History   Tobacco Use  . Smoking status: Former Research scientist (life sciences)  . Smokeless tobacco: Never Used  Substance Use Topics  . Alcohol use: Yes  . Drug use: No     Allergies   Enalapril   Review of Systems Review of Systems  Constitutional: Positive for activity change.  Respiratory: Negative for stridor.   Cardiovascular: Positive for chest  pain.  Skin: Positive for wound.  Allergic/Immunologic: Negative for immunocompromised state.  Hematological: Does not bruise/bleed easily.  All other systems reviewed and are negative.    Physical Exam Updated Vital Signs BP (!) 145/84   Pulse 89   Temp 98.9 F (37.2 C) (Oral)   Resp 14   Ht 5\' 11"  (1.803 m)   Wt 79.4 kg   SpO2 99%   BMI 24.41 kg/m   Physical Exam Vitals signs and nursing note reviewed.  Constitutional:      Appearance: He is well-developed.  HENT:     Head: Atraumatic.  Neck:     Musculoskeletal: Neck supple.  Cardiovascular:     Rate and Rhythm: Normal rate.  Pulmonary:     Effort: Pulmonary effort is normal.  Musculoskeletal:     Comments: Patient has dried blood and scabbing around the right sided tunneled catheter insertion site.  There is slight active bleeding it appears, as blood reaccumulated after the wound was cleaned  Skin:    General: Skin is warm.  Neurological:     Mental Status: He is alert and oriented to person, place, and time.      ED Treatments / Results  Labs (all labs ordered are listed, but only abnormal results are displayed) Labs Reviewed  CBC WITH DIFFERENTIAL/PLATELET - Abnormal; Notable for the following components:      Result Value   WBC 11.5 (*)    RBC 2.26 (*)    Hemoglobin 6.5 (*)    HCT 19.6 (*)    Neutro Abs 9.7 (*)    All other components within normal limits  BASIC METABOLIC PANEL - Abnormal; Notable for the following components:   Potassium 3.4 (*)    Glucose, Bld 114 (*)    BUN 60 (*)    Creatinine, Ser 9.20 (*)    Calcium 8.7 (*)    GFR calc non Af Amer 5 (*)    GFR calc Af Amer 6 (*)    Anion gap 16 (*)    All other components within normal limits  TROPONIN I (HIGH SENSITIVITY) - Abnormal; Notable for the following components:   Troponin I (High Sensitivity) 168 (*)    All other components within normal limits  SARS CORONAVIRUS 2 (TAT 6-24 HRS)  CBC  BASIC METABOLIC PANEL  TYPE AND  SCREEN  PREPARE RBC (CROSSMATCH)  TROPONIN I (HIGH SENSITIVITY)    EKG EKG Interpretation  Date/Time:  Thursday June 25 2019 01:11:11 EDT Ventricular Rate:  93 PR Interval:    QRS Duration: 104 QT Interval:  409 QTC Calculation: 509 R Axis:   -39 Text Interpretation:  Sinus rhythm Probable left atrial enlargement Abnormal R-wave progression, early transition Left ventricular hypertrophy Nonspecific T abnrm, anterolateral leads Prolonged QT interval inferior and lateral ST depression is more pronounced v1, v2 ST elevation present before Confirmed by Kathrynn Humble, Gavan Nordby (  [id number]) on 06/25/2019 1:25:09 AM    EKG Interpretation  Date/Time:  Thursday June 25 2019 01:36:06 EDT Ventricular Rate:  89 PR Interval:    QRS Duration: 103 QT Interval:  411 QTC Calculation: 501 R Axis:   -38 Text Interpretation:  Sinus rhythm Probable left atrial enlargement Abnormal R-wave progression, early transition Left ventricular hypertrophy Prolonged QT interval improved ST changes - appearing more like baseline Confirmed by Varney Biles [id number]) on 06/25/2019 1:44:04 AM        Radiology Dg Chest Port 1 View  Result Date: 06/25/2019 CLINICAL DATA:  Pre transfusion. EXAM: PORTABLE CHEST 1 VIEW COMPARISON:  04/19/2019 FINDINGS: Right dialysis catheter tip in the lower SVC. Mild cardiomegaly with normal mediastinal contours. No focal airspace disease, pulmonary edema, large pleural effusion or pneumothorax. No acute osseous abnormalities are seen. IMPRESSION: Mild cardiomegaly. Electronically Signed   By: Keith Rake M.D.   On: 06/25/2019 03:40    Procedures .Critical Care Performed by: Varney Biles, MD Authorized by: Varney Biles, MD   Critical care provider statement:    Critical care time (minutes):  32   Critical care was necessary to treat or prevent imminent or life-threatening deterioration of the following conditions: Symptomatic anemia requiring blood transfusion.   Critical  care was time spent personally by me on the following activities:  Discussions with consultants, evaluation of patient's response to treatment, examination of patient, ordering and performing treatments and interventions, ordering and review of laboratory studies, ordering and review of radiographic studies, pulse oximetry, re-evaluation of patient's condition, obtaining history from patient or surrogate and review of old charts Wound packing  Date/Time: 06/25/2019 4:13 AM Performed by: Varney Biles, MD Authorized by: Varney Biles, MD  Consent: Verbal consent obtained. Consent given by: patient Patient identity confirmed: arm band Time out: Immediately prior to procedure a "time out" was called to verify the correct patient, procedure, equipment, support staff and site/side marked as required. Local anesthesia used: no  Anesthesia: Local anesthesia used: no  Sedation: Patient sedated: no  Patient tolerance: patient tolerated the procedure well with no immediate complications Comments: Bleeding site was packed with TXA soaked gauze and combat gauze was applied on top of it.     (including critical care time)  Medications Ordered in ED Medications  nitroGLYCERIN (NITROSTAT) SL tablet 0.4 mg (has no administration in time range)  nitroGLYCERIN (NITROSTAT) SL tablet 0.4 mg (0.4 mg Sublingual Given 06/25/19 0425)  0.9 %  sodium chloride infusion (has no administration in time range)  calcium acetate (PHOSLO) capsule 667 mg (has no administration in time range)  metoprolol tartrate (LOPRESSOR) tablet 100 mg (has no administration in time range)  calcitRIOL (ROCALTROL) capsule 0.25 mcg (has no administration in time range)  acetaminophen (TYLENOL) tablet 650 mg (has no administration in time range)    Or  acetaminophen (TYLENOL) suppository 650 mg (has no administration in time range)  ondansetron (ZOFRAN) tablet 4 mg (has no administration in time range)    Or  ondansetron (ZOFRAN)  injection 4 mg (has no administration in time range)  amLODipine (NORVASC) tablet 10 mg (has no administration in time range)  hydrALAZINE (APRESOLINE) tablet 25 mg (has no administration in time range)  furosemide (LASIX) tablet 80 mg (has no administration in time range)  tranexamic acid (CYKLOKAPRON) injection 500 mg (500 mg Topical Given 06/25/19 0425)     Initial Impression / Assessment and Plan / ED Course  I have reviewed the triage vital signs and the nursing notes.  Pertinent labs & imaging results that were available during my care of the patient were reviewed by me and considered in my medical decision making (see chart for details).  Clinical Course as of Jun 24 508  Thu Jun 25, 2019  0414 Elevated troponin is likely because of demand ischemia.  Patient is chest pain-free.  Troponin I (High Sensitivity)(!!): 168 [AN]  0508 Wound site is still oozing slightly.  There is no puncture wound that I can suture.  We have applied appropriate amount of dressing.  There is no surrounding hematoma.  The hope is that the bleeding will cease on its own due to pressure dressing and quick clot dressing.  Medicine team made aware of the persistent slow bleed.   [AN]    Clinical Course User Index [AN] Varney Biles, MD    HEAR Score: 5  Patient comes to the ER with chief complaint of bleeding from his new right-sided tunneled catheter placement to the chest.  After he arrived here he had some nonspecific chest pain.  Initial EKG is showing ST depressions that are more pronounced than usual in the inferior and lateral leads and ST elevation in V1 and V2.  All of those changes were present before, but the EKG showing more pronounced changes.  He has had demand ischemia in the past because of symptomatic anemia.  We will get CBC, high-sensitivity troponin and a repeat EKG.   Reassessment: Repeat EKG looks a lot better.  Suspicion for MI now much lower.   Final Clinical Impressions(s) / ED  Diagnoses   Final diagnoses:  Demand ischemia ([place])  Bleeding due to dialysis catheter placement, initial encounter [facility])  Symptomatic anemia    ED Discharge Orders    None       Varney Biles, MD 06/25/19 [id number]

## 2019-06-25 NOTE — Op Note (Signed)
    Patient name: KEYSHAUN HOFACKER MRN: [id number] DOB: 1951-05-14 Sex: male  06/25/2019 Pre-operative Diagnosis: End-stage renal disease, bleeding from previously placed tunneled dialysis catheter Post-operative diagnosis:  Same Surgeon:  Eda Paschal. Donzetta Matters, MD Procedure Performed:   Placement of 19 cm right IJ tunneled dialysis catheter  Indications: 68 year old male with recently placed tunneled dialysis catheter.  He has been bleeding from his catheter despite pressure and local Dermabond it has not stopped.  Patient has been transfused for bleeding.  He is now indicated for replacement of his catheter.   Procedure:  The patient was identified in the holding area and taken to the operating room where is put supine operative table and general anesthesia induced.  Sterilely prepped and draped in the neck and chest in usual fashion antibiotics were administered and timeout called.  I began by opening his previous neck incision I dissected out the catheter and transected it.  I then removed the sutures on the chest remove the catheter that way.  I wired the previous catheter and placed the introducer sheath.  I tunneled a new 19 cm catheter and trimmed to size.  This was placed to the cavoatrial junction assembled flushed with heparinized saline.  Was affixed the skin with 3-0 nylon suture.  I closed the neck incision with 4 Monocryl as well as his previous incision with 4 Monocryl.  I placed Dermabond at all sites.  Fluoroscopy demonstrated catheter at the cavoatrial junction.  He was awakened anesthesia having tolerated procedure without immediate complication.  Counts were correct at completion.  EBL: 10 cc   Makala Fetterolf C. Donzetta Matters, MD [facility] Office: [phone] Pager: [phone]

## 2019-06-25 NOTE — ED Notes (Signed)
PAGED ADMITTING PER RN  

## 2019-06-25 NOTE — H&P (Signed)
History and Physical    [name] [id number] DOB: [birth year] DOA: 06/25/2019  PCP: Seward Carol, PA-C  Patient coming from: Home  I have personally briefly reviewed patient's old medical records in [place]  Chief Complaint: Bleeding from dialysis catheter site  HPI: Jesus Ayala is a 68 y.o. male with medical history significant of HTN, recent progression of CKD to ESRD, just had 2nd session of dialysis ever on 9/2.  Patient had R sided tunneled catheter placed for dialysis access on Friday.  Catheter site spontaneously started bleeding at skin earlier today around noon.  Stopped for a while, then started again before midnight and has continued to bleed.  He has also started to develop some chest pressure.  Presents to ED for ongoing bleeding from catheter site.  No fevers, chills.  Does have chest pressure.  No nausea, SOB.  Chest pressure is non-radiating and intermittent.   ED Course: Trop 168, HGB 6.5 down from 7.8 on 8/17.  Pressure dressing applied and tranexamic acid applied topically.  2u PRBC transfusion ordered and hospitalist asked to admit.   Review of Systems: As per HPI, otherwise all review of systems negative.  Past Medical History:  Diagnosis Date  . Anemia   . CKD (chronic kidney disease), stage III ([place])   . HLD (hyperlipidemia)   . Hypertension     Past Surgical History:  Procedure Laterality Date  . HERNIA REPAIR       reports that he has quit smoking. He has never used smokeless tobacco. He reports current alcohol use. He reports that he does not use drugs.  Allergies  Allergen Reactions  . Enalapril Swelling    ANGIOEDEMA of lips ANGIOEDEMA of lips ANGIOEDEMA of lips ANGIOEDEMA of lips    Family History  Problem Relation Age of Onset  . Lung cancer Father   . Hypertension Brother      Prior to Admission medications   Medication Sig Start Date End Date Taking? Authorizing Provider  amLODipine (NORVASC) 10 MG  tablet Take 10 mg by mouth daily. 04/12/19  Yes [provider]  aspirin 81 MG chewable tablet Chew 81 mg by mouth daily.   Yes [provider]  calcitRIOL (ROCALTROL) 0.25 MCG capsule Take 0.25 mcg by mouth daily. 06/12/19  Yes [provider]  calcium acetate (PHOSLO) 667 MG capsule Take 1 capsule (667 mg total) by mouth 3 (three) times daily with meals. 04/22/19  Yes Lavina Hamman, MD  furosemide (LASIX) 80 MG tablet Take 80 mg by mouth daily. 01/22/19  Yes [provider]  hydrALAZINE (APRESOLINE) 25 MG tablet Take 1 tablet (25 mg total) by mouth 3 (three) times daily. 04/22/19  Yes Lavina Hamman, MD  metoprolol tartrate (LOPRESSOR) 100 MG tablet Take 100 mg by mouth 2 (two) times a day. 04/12/19  Yes [provider]  nitroGLYCERIN (NITROSTAT) 0.4 MG SL tablet Place 0.4 mg under the tongue every 5 (five) minutes as needed for chest pain.  03/27/19  Yes [provider]    Physical Exam: Vitals:   06/25/19 0100 06/25/19 0115 06/25/19 0130 06/25/19 0145  BP: (!) 156/95 (!) 152/85 (!) 155/81 (!) 150/80  Pulse:      Resp:  14 14 13   Temp:      TempSrc:      SpO2:      Weight:      Height:        Constitutional: NAD, calm, comfortable Eyes: PERRL, lids  and conjunctivae normal ENMT: Mucous membranes are moist. Posterior pharynx clear of any exudate or lesions.Normal dentition.  Neck: normal, supple, no masses, no thyromegaly Respiratory: clear to auscultation bilaterally, no wheezing, no crackles. Normal respiratory effort. No accessory muscle use.  Cardiovascular: Regular rate and rhythm, no murmurs / rubs / gallops. No extremity edema. 2+ pedal pulses. No carotid bruits.  Abdomen: no tenderness, no masses palpated. No hepatosplenomegaly. Bowel sounds positive.  Musculoskeletal: no clubbing / cyanosis. No joint deformity upper and lower extremities. Good ROM, no contractures. Normal muscle tone.  Skin: Pressure dressing in place to R chest  wall.  Blood not saturating dressing at this time.  No significant bleeding around dressing. Neurologic: CN 2-12 grossly intact. Sensation intact, DTR normal. Strength 5/5 in all 4.  Psychiatric: Normal judgment and insight. Alert and oriented x 3. Normal mood.    Labs on Admission: I have personally reviewed following labs and imaging studies  CBC: Recent Labs  Lab 06/25/19 0144  WBC 11.5*  NEUTROABS 9.7*  HGB 6.5*  HCT 19.6*  MCV 86.7  PLT 123XX123   Basic Metabolic Panel: Recent Labs  Lab 06/25/19 0144  NA 138  K 3.4*  CL 100  CO2 22  GLUCOSE 114*  BUN 60*  CREATININE 9.20*  CALCIUM 8.7*   GFR: Estimated Creatinine Clearance: 8.4 mL/min (A) (by C-G formula based on SCr of 9.2 mg/dL (H)). Liver Function Tests: No results for input(s): AST, ALT, ALKPHOS, BILITOT, PROT, ALBUMIN in the last 168 hours. No results for input(s): LIPASE, AMYLASE in the last 168 hours. No results for input(s): AMMONIA in the last 168 hours. Coagulation Profile: No results for input(s): INR, PROTIME in the last 168 hours. Cardiac Enzymes: No results for input(s): CKTOTAL, CKMB, CKMBINDEX, TROPONINI in the last 168 hours. BNP (last 3 results) No results for input(s): PROBNP in the last 8760 hours. HbA1C: No results for input(s): HGBA1C in the last 72 hours. CBG: No results for input(s): GLUCAP in the last 168 hours. Lipid Profile: No results for input(s): CHOL, HDL, LDLCALC, TRIG, CHOLHDL, LDLDIRECT in the last 72 hours. Thyroid Function Tests: No results for input(s): TSH, T4TOTAL, FREET4, T3FREE, THYROIDAB in the last 72 hours. Anemia Panel: No results for input(s): VITAMINB12, FOLATE, FERRITIN, TIBC, IRON, RETICCTPCT in the last 72 hours. Urine analysis:    Component Value Date/Time   COLORURINE STRAW (A) 04/19/2019 0505   APPEARANCEUR CLEAR 04/19/2019 0505   LABSPEC 1.010 04/19/2019 0505   PHURINE 5.0 04/19/2019 0505   GLUCOSEU NEGATIVE 04/19/2019 0505   HGBUR MODERATE (A)  04/19/2019 0505   BILIRUBINUR NEGATIVE 04/19/2019 0505   KETONESUR NEGATIVE 04/19/2019 0505   PROTEINUR 100 (A) 04/19/2019 0505   UROBILINOGEN 1.0 12/12/2009 0201   NITRITE NEGATIVE 04/19/2019 0505   LEUKOCYTESUR NEGATIVE 04/19/2019 0505    Radiological Exams on Admission: Dg Chest Port 1 View  Result Date: 06/25/2019 CLINICAL DATA:  Pre transfusion. EXAM: PORTABLE CHEST 1 VIEW COMPARISON:  04/19/2019 FINDINGS: Right dialysis catheter tip in the lower SVC. Mild cardiomegaly with normal mediastinal contours. No focal airspace disease, pulmonary edema, large pleural effusion or pneumothorax. No acute osseous abnormalities are seen. IMPRESSION: Mild cardiomegaly. Electronically Signed   By: Keith Rake M.D.   On: 06/25/2019 03:40    EKG: Independently reviewed.  Assessment/Plan Principal Problem:   Acute blood loss anemia Active Problems:   Hypertension   ESRD (end stage renal disease) ([place])   Complication of dialysis access insertion    1. Acute blood loss  anemia - 1. Pressure dressing in place and TXA applied 2. 2u PRBC transfusion 3. Repeat CBC in AM 4. Hold ASA 5. If still oozing in AM, call vascular surgery 6. No obvious hematoma or subq collection. 2. HTN - 1. Continue home BP meds 3. ESRD - 1. Cont home meds 2. Cont lasix 3. Call nephrology in AM since we are transfusing 2u PRBC.  DVT prophylaxis: SCDs Code Status: Full Family Communication: Wife at bedside Disposition Plan: Home after admit Consults called: None Admission status: Place in 83    [place], [place] Hospitalists  How to contact the [facility] Attending or Consulting provider [place] or covering provider during after hours [place], for this patient?  1. Check the care team in [facility] and look for a) attending/consulting [facility] provider listed and b) the [facility] team listed 2. Log into [email or web address]  Amion Physician Scheduling and messaging for groups and whole hospitals  On call and physician  scheduling software for group practices, residents, hospitalists and other medical providers for call, clinic, rotation and shift schedules. OnCall Enterprise is a hospital-wide system for scheduling doctors and paging doctors on call. EasyPlot is for scientific plotting and data analysis.  [email or web address]  and use [place]'s universal password to access. If you do not have the password, please contact the hospital operator.  3. Locate the [facility] provider you are looking for under [facility] and page to a number that you can be directly reached. 4. If you still have difficulty reaching the provider, please page the [facility] (Director on Call) for the Hospitalists listed on amion for assistance.  06/25/2019, 4:06 AM

## 2019-06-25 NOTE — Progress Notes (Addendum)
Patient admitted overnight by Dr. Alcario Drought for bleeding from his dialysis catheter site.  I have seen and examined the patient at bedside.  He appears little upset that he still oozing blood and does not have a bed upstairs.  I have explained to him he will be appropriately taken care of for his bleeding catheter site and will be transferred in his room as soon as a bed becomes available. Patient tells me he had his dialysis catheter placed last Friday by vascular team at his nephrologist office, Dr. Carolin Sicks.  He did well with this first session of dialysis on Monday but noticed bleeding after his second dialysis yesterday.  Recently seen by Dr. Doren Custard from vascular team with plans for AV graft placement on 07/07/2019.  Therefore came to the hospital. This morning he is still oozing a little bit of blood from his catheter site.  Baseline hemoglobin 7.8, it had trended down to 6.1 requiring 2 units of PRBC transfusion.  So far he is received 1 unit PRBC.  Currently his vital signs are stable.  He is afebrile.  He does have slightly saturated dressing on his right chest wall around his catheter site there is mild oozing.  I spoke with Dr. Carolin Sicks who is his primary nephrologist and he recommended I speak with vascular team to help with the bleeding. Dr Donzetta Matters consulted. I have also consulted inpatient ESRD team, Dr. Jonnie Finner, as patient will likely require dialysis.  We will repeat hemoglobin after his second unit of transfusion.  Continue holding aspirin.  Monitor him clinically.  Discussed with patient's RN in the ER.  Please call with further questions.  Gerlean Ren MD  Time spent 15 mins

## 2019-06-26 ENCOUNTER — Encounter (HOSPITAL_COMMUNITY): Payer: Self-pay | Admitting: Vascular Surgery

## 2019-06-26 DIAGNOSIS — D62 Acute posthemorrhagic anemia: Secondary | ICD-10-CM | POA: Diagnosis not present

## 2019-06-26 LAB — BASIC METABOLIC PANEL
Anion gap: 14 (ref 5–15)
BUN: 64 mg/dL — ABNORMAL HIGH (ref 8–23)
CO2: 23 mmol/L (ref 22–32)
Calcium: 9.1 mg/dL (ref 8.9–10.3)
Chloride: 102 mmol/L (ref 98–111)
Creatinine, Ser: 10.09 mg/dL — ABNORMAL HIGH (ref 0.61–1.24)
GFR calc Af Amer: 6 mL/min — ABNORMAL LOW (ref >60)
GFR calc non Af Amer: 5 mL/min — ABNORMAL LOW (ref >60)
Glucose, Bld: 95 mg/dL (ref 70–99)
Potassium: 3.4 mmol/L — ABNORMAL LOW (ref 3.5–5.1)
Sodium: 139 mmol/L (ref 135–145)

## 2019-06-26 LAB — CBC
HCT: 21.8 % — ABNORMAL LOW (ref 39.0–52.0)
Hemoglobin: 7.1 g/dL — ABNORMAL LOW (ref 13.0–17.0)
MCH: 29.1 pg (ref 26.0–34.0)
MCHC: 32.6 g/dL (ref 30.0–36.0)
MCV: 89.3 fL (ref 80.0–100.0)
Platelets: 167 10*3/uL (ref 150–400)
RBC: 2.44 MIL/uL — ABNORMAL LOW (ref 4.22–5.81)
RDW: 15.4 % (ref 11.5–15.5)
WBC: 6.5 10*3/uL (ref 4.0–10.5)
nRBC: 0 % (ref 0.0–0.2)

## 2019-06-26 LAB — PREPARE RBC (CROSSMATCH)

## 2019-06-26 MED ORDER — HEPARIN SODIUM (PORCINE) 1000 UNIT/ML IJ SOLN
INTRAMUSCULAR | Status: AC
  Administered 2019-06-26: 3000 [IU]
  Filled 2019-06-26: qty 3

## 2019-06-26 MED ORDER — CALCITRIOL 0.25 MCG PO CAPS
ORAL_CAPSULE | ORAL | Status: AC
  Administered 2019-06-26: 0.25 ug via ORAL
  Filled 2019-06-26: qty 1

## 2019-06-26 MED ORDER — SODIUM CHLORIDE 0.9% IV SOLUTION: Freq: Once | INTRAVENOUS | Status: DC

## 2019-06-26 NOTE — Discharge Summary (Signed)
Physician Discharge Summary  Jesus Ayala [id number] DOB: [birth year] DOA: 06/25/2019  PCP: Seward Carol, PA-C  Admit date: 06/25/2019 Discharge date: 06/26/2019  Admitted From: Home Disposition: Home  Recommendations for Outpatient Follow-up:  1. Follow up with PCP in 1-2 weeks 2. Please obtain BMP/CBC in one week your next doctors visit.  3. Has appointment for AV graft placement with vascular, Dr. Doren Custard on 07/07/2019 4. Resume outpatient dialysis on upcoming Monday.  Follow-up outpatient nephrology.   Discharge Condition: Stable CODE STATUS: Full Diet recommendation: Renal  Brief/Interim Summary: 68 y.o. male with medical history significant of HTN, recent progression of CKD to ESRD, just had 2nd session of dialysis ever on 9/2.  Patient had R sided tunneled catheter placed for dialysis access on Friday.  Catheter site spontaneously started bleeding at skin earlier today around noon.  Stopped for a while, then started again before midnight and has continued to bleed.  He has also started to develop some chest pressure.  Presents to ED for ongoing bleeding from catheter site. Upon admission due to blood loss anemia with hemoglobin of 6.4, he received 2 units of PRBC transfusion.  Was seen by vascular team and his catheter was exchanged on 06/25/2019.  He tolerated the procedure well.  Nephrology was consulted who performed dialysis prior to his discharge on 06/26/2019.  Currently medically stable to be discharged with outpatient follow-up as recommended above.   Discharge Diagnoses:  Principal Problem:   Acute blood loss anemia Active Problems:   Hypertension   ESRD (end stage renal disease) ([place])   Complication of dialysis access insertion  Acute blood loss anemia secondary to bleeding from the catheter site, resolved Anemia of chronic disease -Received 2 units of PRBC transfusion.  Catheter has been exchanged and new one placed in the OR on 9/3 by vascular surgery.  Has  upcoming appointment later this month for AV graft placement by Dr. Doren Custard. - Hemoglobin is stable. Borderline low still, will give 1 more unit prior to discharge. Repeat CBC on Upcoming Monday.   ESRD on hemodialysis Monday Wednesday Friday -Nephro consulted.  Received his dialysis session this morning.  Essential hypertension -Resume home meds  Consultations:  Vascular  Nephrology  Subjective: Seen and examined in the dialysis, no complaints.  Feeling better.  No more bleeding noted from his catheter site  Discharge Exam: Vitals:   06/26/19 0930 06/26/19 1000  BP: 132/78 135/80  Pulse: 77 69  Resp: 16 16  Temp:    SpO2:     Vitals:   06/26/19 0830 06/26/19 0900 06/26/19 0930 06/26/19 1000  BP: (!) 154/84 (!) 128/92 132/78 135/80  Pulse: 80 83 77 69  Resp: 16 18 16 16   Temp:      TempSrc:      SpO2:      Weight:      Height:        General: Pt is alert, awake, not in acute distress Cardiovascular: RRR, S1/S2 +, no rubs, no gallops Respiratory: CTA bilaterally, no wheezing, no rhonchi Abdominal: Soft, NT, ND, bowel sounds + Extremities: no edema, no cyanosis Right chest wall catheter noted without any evidence of active bleeding.  Discharge Instructions  Discharge Instructions    Call MD for:  extreme fatigue   Complete by: As directed    Diet - low sodium heart healthy   Complete by: As directed    Increase activity slowly   Complete by: As directed      Allergies as of  06/26/2019      Reactions   Enalapril Swelling   ANGIOEDEMA of lips ANGIOEDEMA of lips ANGIOEDEMA of lips ANGIOEDEMA of lips      Medication List    TAKE these medications   amLODipine 10 MG tablet Commonly known as: NORVASC Take 10 mg by mouth daily.   aspirin 81 MG chewable tablet Chew 81 mg by mouth daily.   calcitRIOL 0.25 MCG capsule Commonly known as: ROCALTROL Take 0.25 mcg by mouth daily.   calcium acetate 667 MG capsule Commonly known as: PHOSLO Take 1 capsule  (667 mg total) by mouth 3 (three) times daily with meals.   furosemide 80 MG tablet Commonly known as: LASIX Take 80 mg by mouth daily.   hydrALAZINE 25 MG tablet Commonly known as: APRESOLINE Take 1 tablet (25 mg total) by mouth 3 (three) times daily.   metoprolol tartrate 100 MG tablet Commonly known as: LOPRESSOR Take 100 mg by mouth 2 (two) times a day.   nitroGLYCERIN 0.4 MG SL tablet Commonly known as: NITROSTAT Place 0.4 mg under the tongue every 5 (five) minutes as needed for chest pain.      Follow-up Information    Seward Carol, PA-C.   Specialty: Neurology Contact information: [address] [place] [address] [phone]          Allergies  Allergen Reactions  . Enalapril Swelling    ANGIOEDEMA of lips ANGIOEDEMA of lips ANGIOEDEMA of lips ANGIOEDEMA of lips    You were cared for by a hospitalist during your hospital stay. If you have any questions about your discharge medications or the care you received while you were in the hospital after you are discharged, you can call the unit and asked to speak with the hospitalist on call if the hospitalist that took care of you is not available. Once you are discharged, your primary care physician will handle any further medical issues. Please note that no refills for any discharge medications will be authorized once you are discharged, as it is imperative that you return to your primary care physician (or establish a relationship with a primary care physician if you do not have one) for your aftercare needs so that they can reassess your need for medications and monitor your lab values.   Procedures/Studies: Dg Chest Port 1 View  Result Date: 06/25/2019 CLINICAL DATA:  Pre transfusion. EXAM: PORTABLE CHEST 1 VIEW COMPARISON:  04/19/2019 FINDINGS: Right dialysis catheter tip in the lower SVC. Mild cardiomegaly with normal mediastinal contours. No focal airspace disease, pulmonary edema,  large pleural effusion or pneumothorax. No acute osseous abnormalities are seen. IMPRESSION: Mild cardiomegaly. Electronically Signed   By: Keith Rake M.D.   On: 06/25/2019 03:40   Dg Fluoro Guide Cv Line-no Report  Result Date: 06/25/2019 Fluoroscopy was utilized by the requesting physician.  No radiographic interpretation.   Vas [place] Upper Extremity Arterial Duplex  Result Date: 06/17/2019 UPPER EXTREMITY DUPLEX STUDY Indications: Patient complains of pre access.  Performing Technologist: Alvia Grove RVT  Examination Guidelines: A complete evaluation includes B-mode imaging, spectral Doppler, color Doppler, and power Doppler as needed of all accessible portions of each vessel. Bilateral testing is considered an integral part of a complete examination. Limited examinations for reoccurring indications may be performed as noted.  Right Pre-Dialysis Findings: +-----------------------+----------+--------------------+---------+--------+ Location               PSV (cm/s)Intralum. Diam. (cm)Waveform Comments +-----------------------+----------+--------------------+---------+--------+ Brachial Antecub. fossa57  0.52                triphasic         +-----------------------+----------+--------------------+---------+--------+ Radial Art at Wrist    65        0.31                triphasic         +-----------------------+----------+--------------------+---------+--------+ Ulnar Art at Wrist     88        0.21                triphasic         +-----------------------+----------+--------------------+---------+--------+ +------------+-----------------+ Allen's Testbranches at fossa +------------+-----------------+ Left Pre-Dialysis Findings: +-----------------------+----------+--------------------+---------+--------+ Location               PSV (cm/s)Intralum. Diam. (cm)Waveform Comments +-----------------------+----------+--------------------+---------+--------+ Brachial  Antecub. fossa82        0.53                triphasic         +-----------------------+----------+--------------------+---------+--------+ Radial Art at Wrist    69        0.29                triphasic         +-----------------------+----------+--------------------+---------+--------+ Ulnar Art at Wrist     42        0.13                triphasic         +-----------------------+----------+--------------------+---------+--------+ +------------+-----------------+ Allen's Testbranches at fossa +------------+-----------------+  Summary:   Measurements above. *See table(s) above for measurements and observations. Electronically signed by Deitra Mayo MD on 06/17/2019 at 2:42:34 PM.    Final    Vas [place] Upper Extremity Vein Mapping  Result Date: 06/17/2019 UPPER EXTREMITY VEIN MAPPING  Indications: Pre-access. Performing Technologist: Alvia Grove RVT  Examination Guidelines: A complete evaluation includes B-mode imaging, spectral Doppler, color Doppler, and power Doppler as needed of all accessible portions of each vessel. Bilateral testing is considered an integral part of a complete examination. Limited examinations for reoccurring indications may be performed as noted. +-----------------+-------------+----------+--------------+ Right Cephalic   Diameter (cm)Depth (cm)   Findings    +-----------------+-------------+----------+--------------+ Prox upper arm       0.08                 too small    +-----------------+-------------+----------+--------------+ Mid upper arm                             too small    +-----------------+-------------+----------+--------------+ Dist upper arm       0.12                              +-----------------+-------------+----------+--------------+ Antecubital fossa    0.21                thick walls   +-----------------+-------------+----------+--------------+ Prox forearm         0.16                thick walls    +-----------------+-------------+----------+--------------+ Mid forearm                             not visualized +-----------------+-------------+----------+--------------+ Dist forearm  not visualized +-----------------+-------------+----------+--------------+ Wrist                                   not visualized +-----------------+-------------+----------+--------------+ +-----------------+-------------+----------+-------------------------+ Right Basilic    Diameter (cm)Depth (cm)        Findings          +-----------------+-------------+----------+-------------------------+ Prox upper arm                               not visualized       +-----------------+-------------+----------+-------------------------+ Mid upper arm        0.24                                         +-----------------+-------------+----------+-------------------------+ Dist upper arm       0.29                   joins,thick walls     +-----------------+-------------+----------+-------------------------+ Antecubital fossa    0.36               thick walls and branching +-----------------+-------------+----------+-------------------------+ Prox forearm         0.16                    partially comp       +-----------------+-------------+----------+-------------------------+ +-----------------+-------------+----------+-----------+ Left Cephalic    Diameter (cm)Depth (cm) Findings   +-----------------+-------------+----------+-----------+ Prox upper arm                           too small  +-----------------+-------------+----------+-----------+ Mid upper arm                            too small  +-----------------+-------------+----------+-----------+ Dist upper arm                           too small  +-----------------+-------------+----------+-----------+ Antecubital fossa    0.22               thick walls  +-----------------+-------------+----------+-----------+ Mid forearm                              too small  +-----------------+-------------+----------+-----------+ Dist forearm         0.09                           +-----------------+-------------+----------+-----------+ +-----------------+-------------+----------+--------------+ Left Basilic     Diameter (cm)Depth (cm)   Findings    +-----------------+-------------+----------+--------------+ Prox upper arm                          not visualized +-----------------+-------------+----------+--------------+ Mid upper arm        0.22                   joins      +-----------------+-------------+----------+--------------+ Dist upper arm       0.23                thick walls   +-----------------+-------------+----------+--------------+ Antecubital fossa    0.17  thick walls   +-----------------+-------------+----------+--------------+ Prox forearm                            not visualized +-----------------+-------------+----------+--------------+ Summary:   Measurements above, Thickened walls throughout bilaterally.  Probabley suboptimal veins bilaterally. *See table(s) above for measurements and observations.  Diagnosing physician: Deitra Mayo MD Electronically signed by Deitra Mayo MD on 06/17/2019 at 2:41:53 PM.    Final       The results of significant diagnostics from this hospitalization (including imaging, microbiology, ancillary and laboratory) are listed below for reference.     Microbiology: Recent Results (from the past 240 hour(s))  SARS CORONAVIRUS 2 (TAT 6-24 HRS) Nasopharyngeal Nasopharyngeal Swab     Status: None   Collection Time: 06/25/19  2:51 AM   Specimen: Nasopharyngeal Swab  Result Value Ref Range Status   SARS Coronavirus 2 NEGATIVE NEGATIVE Final    Comment: (NOTE) SARS-CoV-2 target nucleic acids are NOT DETECTED. The SARS-CoV-2 RNA is generally detectable  in upper and lower respiratory specimens during the acute phase of infection. Negative results do not preclude SARS-CoV-2 infection, do not rule out co-infections with other pathogens, and should not be used as the sole basis for treatment or other patient management decisions. Negative results must be combined with clinical observations, patient history, and epidemiological information. The expected result is Negative. Fact Sheet for Patients: SugarRoll.be Fact Sheet for Healthcare Providers: https://www.woods-mathews.com/ This test is not yet approved or cleared by the [place] FDA and  has been authorized for detection and/or diagnosis of SARS-CoV-2 by FDA under an Emergency Use Authorization (EUA). This EUA will remain  in effect (meaning this test can be used) for the duration of the COVID-19 declaration under Section 56 4(b)(1) of the Act, 21 U.S.C. section 360bbb-3(b)(1), unless the authorization is terminated or revoked sooner. Performed at [facility], [place] [address] [place], [place] [address]      Labs: BNP (last 3 results) No results for input(s): BNP in the last 8760 hours. Basic Metabolic Panel: Recent Labs  Lab 06/25/19 0144 06/25/19 0425 06/25/19 1445 06/26/19 0800  NA 138 138 138 139  K 3.4* 3.4* 3.6 3.4*  CL 100 100  --  102  CO2 22 23  --  23  GLUCOSE 114* 97 108* 95  BUN 60* 62*  --  64*  CREATININE 9.20* 9.25*  --  10.09*  CALCIUM 8.7* 8.2*  --  9.1   Liver Function Tests: No results for input(s): AST, ALT, ALKPHOS, BILITOT, PROT, ALBUMIN in the last 168 hours. No results for input(s): LIPASE, AMYLASE in the last 168 hours. No results for input(s): AMMONIA in the last 168 hours. CBC: Recent Labs  Lab 06/25/19 0144 06/25/19 0425 06/25/19 1445 06/26/19 0800  WBC 11.5* 10.7*  --  6.5  NEUTROABS 9.7*  --   --   --   HGB 6.5* 6.1* 8.5* 7.1*  HCT 19.6* 18.8* 25.0* 21.8*  MCV 86.7 87.9  --   89.3  PLT 208 203  --  167   Cardiac Enzymes: No results for input(s): CKTOTAL, CKMB, CKMBINDEX, TROPONINI in the last 168 hours. BNP: Invalid input(s): POCBNP CBG: Recent Labs  Lab 06/25/19 1541  GLUCAP 108*   D-Dimer No results for input(s): DDIMER in the last 72 hours. Hgb A1c No results for input(s): HGBA1C in the last 72 hours. Lipid Profile No results for input(s): CHOL, HDL, LDLCALC, TRIG, CHOLHDL, LDLDIRECT in the  last 72 hours. Thyroid function studies No results for input(s): TSH, T4TOTAL, T3FREE, THYROIDAB in the last 72 hours.  Invalid input(s): FREET3 Anemia work up No results for input(s): VITAMINB12, FOLATE, FERRITIN, TIBC, IRON, RETICCTPCT in the last 72 hours. Urinalysis    Component Value Date/Time   COLORURINE STRAW (A) 04/19/2019 0505   APPEARANCEUR CLEAR 04/19/2019 0505   LABSPEC 1.010 04/19/2019 0505   PHURINE 5.0 04/19/2019 0505   GLUCOSEU NEGATIVE 04/19/2019 0505   HGBUR MODERATE (A) 04/19/2019 0505   BILIRUBINUR NEGATIVE 04/19/2019 0505   KETONESUR NEGATIVE 04/19/2019 0505   PROTEINUR 100 (A) 04/19/2019 0505   UROBILINOGEN 1.0 12/12/2009 0201   NITRITE NEGATIVE 04/19/2019 0505   LEUKOCYTESUR NEGATIVE 04/19/2019 0505   Sepsis Labs Invalid input(s): PROCALCITONIN,  WBC,  LACTICIDVEN Microbiology Recent Results (from the past 240 hour(s))  SARS CORONAVIRUS 2 (TAT 6-24 HRS) Nasopharyngeal Nasopharyngeal Swab     Status: None   Collection Time: 06/25/19  2:51 AM   Specimen: Nasopharyngeal Swab  Result Value Ref Range Status   SARS Coronavirus 2 NEGATIVE NEGATIVE Final    Comment: (NOTE) SARS-CoV-2 target nucleic acids are NOT DETECTED. The SARS-CoV-2 RNA is generally detectable in upper and lower respiratory specimens during the acute phase of infection. Negative results do not preclude SARS-CoV-2 infection, do not rule out co-infections with other pathogens, and should not be used as the sole basis for treatment or other patient management  decisions. Negative results must be combined with clinical observations, patient history, and epidemiological information. The expected result is Negative. Fact Sheet for Patients: SugarRoll.be Fact Sheet for Healthcare Providers: https://www.woods-mathews.com/ This test is not yet approved or cleared by the [place] FDA and  has been authorized for detection and/or diagnosis of SARS-CoV-2 by FDA under an Emergency Use Authorization (EUA). This EUA will remain  in effect (meaning this test can be used) for the duration of the COVID-19 declaration under Section 56 4(b)(1) of the Act, 21 U.S.C. section 360bbb-3(b)(1), unless the authorization is terminated or revoked sooner. Performed at [facility], [place] [address], [place], [place] [address]      Time coordinating discharge:  I have spent 35 minutes face to face with the patient and on the ward discussing the patients care, assessment, plan and disposition with other care givers. >50% of the time was devoted counseling the patient about the risks and benefits of treatment/Discharge disposition and coordinating care.   SIGNED:   Damita Lack, MD  [facility] 06/26/2019, 11:15 AM   If 7PM-7AM, please contact night-coverage [email or web address]

## 2019-06-26 NOTE — Procedures (Signed)
Exam: Gen alert, no distress No rash, cyanosis or gangrene Sclera anicteric, throat clear  No jvd or bruits, R chest TDC w/ some drying up blood at exit site Chest clear bilat to bases, no rales or wheezing RRR no MRG Abd soft ntnd no mass or ascites +bs GU normal male MS no joint effusions or deformity Ext 2+ diffuse pretib edema Neuro is alert, Ox 3, NF    Pt seen on HD, had new TDC placed due to oozing yest and working well on HD this am. No new c/o's.  Was taking BP meds pre HD this week as OP which may have contributed to his syncopal episode on Wed.  Going home today after HD.    Home meds:  - amlodipine 10/ furosemide 80 qd/ hydralazine 25 tid/ metoprolol 100 bid  - sl ntg prn/ aspirin 81 qd  - calc acetate ac tid  - prn's/ vitamins/ supplements     Outpt HD: [place] MWF  3h  300/600  77kg (needs lowering)  TDC  Hep 5000     Assessment/ Plan: 1. Bleeding TDC - placed 8/28. Replaced here by [facility] on 9/3, working well on HD this am.  2. ESRD - new start has had HD x 2 this week. Has appt for AVF on 9/15 3. Vol - bilat leg edema is not severe. Cont to lower volume / edw in OP setting gradually.  4. HTN - cont meds, don't take am doses on HD days 5. Anemia ABL/ CKD - sp 2u prbc  6. MBD ckd - cont meds 7. Dispo - for dc today   Kelly Splinter MD [employer] pager [phone]   06/26/2019, 12:24 PM

## 2019-06-26 NOTE — Progress Notes (Signed)
   I have seen and evaluated patient.  TDC no longer bleeding.  Okay for use.  He is scheduled for left arm AV graft in the near future with Dr. Scot Dock.  Antanette Richwine C. Donzetta Matters, MD [facility] Office: [phone] Pager: [phone]

## 2019-06-26 NOTE — Care Management Obs Status (Signed)
[place] NOTIFICATION   Patient Details  Name: Jesus Ayala MRN: [id number] Date of Birth: October 04, 1951   Medicare Observation Status Notification Given:  Yes    Zenon Mayo, RN 06/26/2019, 1:25 PM

## 2019-06-26 NOTE — H&P (View-Only) (Signed)
   I have seen and evaluated patient.  TDC no longer bleeding.  Okay for use.  He is scheduled for left arm AV graft in the near future with Dr. Scot Dock.  Jarren Para C. Donzetta Matters, MD [facility] [place] Office: [phone] Pager: [phone]

## 2019-06-27 LAB — BPAM RBC
Blood Product Expiration Date: 202009232359
Blood Product Expiration Date: 202009232359
Blood Product Expiration Date: 202010092359
ISSUE DATE / TIME: 202009030643
ISSUE DATE / TIME: 202009031201
ISSUE DATE / TIME: 202009041215
Unit Type and Rh: 5100
Unit Type and Rh: 5100
Unit Type and Rh: 5100

## 2019-06-27 LAB — TYPE AND SCREEN
ABO/RH(D): O POS
Antibody Screen: NEGATIVE
Unit division: 0
Unit division: 0
Unit division: 0

## 2019-06-29 DIAGNOSIS — N2581 Secondary hyperparathyroidism of renal origin: Secondary | ICD-10-CM | POA: Diagnosis not present

## 2019-06-29 DIAGNOSIS — D509 Iron deficiency anemia, unspecified: Secondary | ICD-10-CM | POA: Diagnosis not present

## 2019-06-29 DIAGNOSIS — Z23 Encounter for immunization: Secondary | ICD-10-CM | POA: Diagnosis not present

## 2019-06-29 DIAGNOSIS — N186 End stage renal disease: Secondary | ICD-10-CM | POA: Diagnosis not present

## 2019-06-29 DIAGNOSIS — Z992 Dependence on renal dialysis: Secondary | ICD-10-CM | POA: Diagnosis not present

## 2019-06-29 DIAGNOSIS — E876 Hypokalemia: Secondary | ICD-10-CM | POA: Diagnosis not present

## 2019-06-29 DIAGNOSIS — R300 Dysuria: Secondary | ICD-10-CM | POA: Diagnosis not present

## 2019-06-30 ENCOUNTER — Inpatient Hospital Stay (HOSPITAL_COMMUNITY)
Admission: RE | Admit: 2019-06-30 | Discharge: 2019-06-30 | Disposition: A | Discharge status: Home / Self Care | Payer: Medicare Other | Source: Ambulatory Visit | Attending: Nephrology | Admitting: Nephrology

## 2019-06-30 DIAGNOSIS — R300 Dysuria: Secondary | ICD-10-CM | POA: Insufficient documentation

## 2019-07-01 DIAGNOSIS — Z23 Encounter for immunization: Secondary | ICD-10-CM | POA: Diagnosis not present

## 2019-07-01 DIAGNOSIS — Z992 Dependence on renal dialysis: Secondary | ICD-10-CM | POA: Diagnosis not present

## 2019-07-01 DIAGNOSIS — R300 Dysuria: Secondary | ICD-10-CM | POA: Diagnosis not present

## 2019-07-01 DIAGNOSIS — E876 Hypokalemia: Secondary | ICD-10-CM | POA: Diagnosis not present

## 2019-07-01 DIAGNOSIS — D509 Iron deficiency anemia, unspecified: Secondary | ICD-10-CM | POA: Diagnosis not present

## 2019-07-01 DIAGNOSIS — N186 End stage renal disease: Secondary | ICD-10-CM | POA: Diagnosis not present

## 2019-07-01 DIAGNOSIS — N2581 Secondary hyperparathyroidism of renal origin: Secondary | ICD-10-CM | POA: Diagnosis not present

## 2019-07-03 ENCOUNTER — Other Ambulatory Visit (HOSPITAL_COMMUNITY): Payer: Medicare Other

## 2019-07-03 DIAGNOSIS — Z992 Dependence on renal dialysis: Secondary | ICD-10-CM | POA: Diagnosis not present

## 2019-07-03 DIAGNOSIS — E876 Hypokalemia: Secondary | ICD-10-CM | POA: Diagnosis not present

## 2019-07-03 DIAGNOSIS — N186 End stage renal disease: Secondary | ICD-10-CM | POA: Diagnosis not present

## 2019-07-03 DIAGNOSIS — N2581 Secondary hyperparathyroidism of renal origin: Secondary | ICD-10-CM | POA: Diagnosis not present

## 2019-07-03 DIAGNOSIS — Z23 Encounter for immunization: Secondary | ICD-10-CM | POA: Diagnosis not present

## 2019-07-03 DIAGNOSIS — D509 Iron deficiency anemia, unspecified: Secondary | ICD-10-CM | POA: Diagnosis not present

## 2019-07-06 ENCOUNTER — Encounter (HOSPITAL_COMMUNITY): Payer: Medicare Other

## 2019-07-06 DIAGNOSIS — Z992 Dependence on renal dialysis: Secondary | ICD-10-CM | POA: Diagnosis not present

## 2019-07-06 DIAGNOSIS — N186 End stage renal disease: Secondary | ICD-10-CM | POA: Diagnosis not present

## 2019-07-06 DIAGNOSIS — E876 Hypokalemia: Secondary | ICD-10-CM | POA: Diagnosis not present

## 2019-07-06 DIAGNOSIS — D509 Iron deficiency anemia, unspecified: Secondary | ICD-10-CM | POA: Diagnosis not present

## 2019-07-06 DIAGNOSIS — Z23 Encounter for immunization: Secondary | ICD-10-CM | POA: Diagnosis not present

## 2019-07-06 DIAGNOSIS — N2581 Secondary hyperparathyroidism of renal origin: Secondary | ICD-10-CM | POA: Diagnosis not present

## 2019-07-07 ENCOUNTER — Encounter (HOSPITAL_COMMUNITY): Payer: Medicare Other

## 2019-07-08 DIAGNOSIS — D509 Iron deficiency anemia, unspecified: Secondary | ICD-10-CM | POA: Diagnosis not present

## 2019-07-08 DIAGNOSIS — N2581 Secondary hyperparathyroidism of renal origin: Secondary | ICD-10-CM | POA: Diagnosis not present

## 2019-07-08 DIAGNOSIS — E876 Hypokalemia: Secondary | ICD-10-CM | POA: Diagnosis not present

## 2019-07-08 DIAGNOSIS — Z992 Dependence on renal dialysis: Secondary | ICD-10-CM | POA: Diagnosis not present

## 2019-07-08 DIAGNOSIS — N186 End stage renal disease: Secondary | ICD-10-CM | POA: Diagnosis not present

## 2019-07-08 DIAGNOSIS — Z23 Encounter for immunization: Secondary | ICD-10-CM | POA: Diagnosis not present

## 2019-07-09 DIAGNOSIS — N186 End stage renal disease: Secondary | ICD-10-CM | POA: Diagnosis not present

## 2019-07-09 DIAGNOSIS — N2581 Secondary hyperparathyroidism of renal origin: Secondary | ICD-10-CM | POA: Diagnosis not present

## 2019-07-09 DIAGNOSIS — R079 Chest pain, unspecified: Secondary | ICD-10-CM | POA: Diagnosis not present

## 2019-07-09 DIAGNOSIS — Z992 Dependence on renal dialysis: Secondary | ICD-10-CM | POA: Diagnosis not present

## 2019-07-09 DIAGNOSIS — G479 Sleep disorder, unspecified: Secondary | ICD-10-CM | POA: Diagnosis not present

## 2019-07-10 ENCOUNTER — Other Ambulatory Visit (HOSPITAL_COMMUNITY)
Admission: RE | Admit: 2019-07-10 | Discharge: 2019-07-10 | Disposition: A | Discharge status: Home / Self Care | Payer: Medicare Other | Source: Ambulatory Visit | Attending: Vascular Surgery | Admitting: Vascular Surgery

## 2019-07-10 DIAGNOSIS — Z01812 Encounter for preprocedural laboratory examination: Secondary | ICD-10-CM | POA: Insufficient documentation

## 2019-07-10 DIAGNOSIS — E876 Hypokalemia: Secondary | ICD-10-CM | POA: Diagnosis not present

## 2019-07-10 DIAGNOSIS — Z992 Dependence on renal dialysis: Secondary | ICD-10-CM | POA: Diagnosis not present

## 2019-07-10 DIAGNOSIS — Z23 Encounter for immunization: Secondary | ICD-10-CM | POA: Diagnosis not present

## 2019-07-10 DIAGNOSIS — Z20828 Contact with and (suspected) exposure to other viral communicable diseases: Secondary | ICD-10-CM | POA: Insufficient documentation

## 2019-07-10 DIAGNOSIS — N186 End stage renal disease: Secondary | ICD-10-CM | POA: Diagnosis not present

## 2019-07-10 DIAGNOSIS — D509 Iron deficiency anemia, unspecified: Secondary | ICD-10-CM | POA: Diagnosis not present

## 2019-07-10 DIAGNOSIS — N2581 Secondary hyperparathyroidism of renal origin: Secondary | ICD-10-CM | POA: Diagnosis not present

## 2019-07-11 LAB — NOVEL CORONAVIRUS, NAA (HOSP ORDER, SEND-OUT TO REF LAB; TAT 18-24 HRS): SARS-CoV-2, NAA: NOT DETECTED

## 2019-07-13 ENCOUNTER — Encounter (HOSPITAL_COMMUNITY): Payer: Self-pay | Admitting: *Deleted

## 2019-07-13 ENCOUNTER — Other Ambulatory Visit: Payer: Self-pay

## 2019-07-13 DIAGNOSIS — N2581 Secondary hyperparathyroidism of renal origin: Secondary | ICD-10-CM | POA: Diagnosis not present

## 2019-07-13 DIAGNOSIS — N186 End stage renal disease: Secondary | ICD-10-CM | POA: Diagnosis not present

## 2019-07-13 DIAGNOSIS — Z992 Dependence on renal dialysis: Secondary | ICD-10-CM | POA: Diagnosis not present

## 2019-07-13 DIAGNOSIS — Z23 Encounter for immunization: Secondary | ICD-10-CM | POA: Diagnosis not present

## 2019-07-13 DIAGNOSIS — D509 Iron deficiency anemia, unspecified: Secondary | ICD-10-CM | POA: Diagnosis not present

## 2019-07-13 DIAGNOSIS — E876 Hypokalemia: Secondary | ICD-10-CM | POA: Diagnosis not present

## 2019-07-13 NOTE — Anesthesia Preprocedure Evaluation (Addendum)
Anesthesia Evaluation  Patient identified by MRN, date of birth, ID band Patient awake    Reviewed: Allergy & Precautions, NPO status , Patient's Chart, lab work & pertinent test results  History of Anesthesia Complications Negative for: history of anesthetic complications  Airway Mallampati: II  TM Distance: >3 FB Neck ROM: Full    Dental  (+) Poor Dentition, Missing, Dental Advidsory Given,    Pulmonary former smoker,    breath sounds clear to auscultation       Cardiovascular hypertension, Pt. on medications (-) angina(-) Past MI and (-) CHF  Rhythm:Regular     Neuro/Psych PSYCHIATRIC DISORDERS Anxiety negative neurological ROS     GI/Hepatic negative GI ROS, Neg liver ROS,   Endo/Other  negative endocrine ROS  Renal/GU ESRF and DialysisRenal disease     Musculoskeletal   Abdominal   Peds  Hematology  (+) Blood dyscrasia, anemia ,   Anesthesia Other Findings   Reproductive/Obstetrics                           Anesthesia Physical Anesthesia Plan  ASA: IV  Anesthesia Plan: MAC   Post-op Pain Management:    Induction: Intravenous  PONV Risk Score and Plan: 1 and Treatment may vary due to age or medical condition and Propofol infusion  Airway Management Planned: Nasal Cannula  Additional Equipment: None  Intra-op Plan:   Post-operative Plan:   Informed Consent: I have reviewed the patients History and Physical, chart, labs and discussed the procedure including the risks, benefits and alternatives for the proposed anesthesia with the patient or authorized representative who has indicated his/her understanding and acceptance.     Dental advisory given  Plan Discussed with: Anesthesiologist, CRNA and Surgeon  Anesthesia Plan Comments: (Hospitalized 06/25/2019- 06/26/2019 when he presented with chest pain secondary to demand ischemia/acute blood loss anemia due to bleeding from  recently placed R sided tunneled catheter placed for dialysis access. Catheter was changed during admission. Received 3 units PRBCs. Chest pain resolved.  TTE 04/20/19:  1. The left ventricle has normal systolic function with an ejection fraction of 60-65%. The cavity size was normal. Left ventricular diastolic Doppler parameters are consistent with impaired relaxation. No evidence of left ventricular regional wall  motion abnormalities.  2. The right ventricle has normal systolic function. The cavity was normal. There is no increase in right ventricular wall thickness.  3. Left atrial size was mildly dilated.    )    Anesthesia Quick Evaluation

## 2019-07-14 ENCOUNTER — Encounter (HOSPITAL_COMMUNITY)
Admission: RE | Disposition: A | Discharge status: Home / Self Care | Payer: Self-pay | Source: Home / Self Care | Attending: Vascular Surgery

## 2019-07-14 ENCOUNTER — Ambulatory Visit (HOSPITAL_COMMUNITY): Payer: Medicare Other | Admitting: Physician Assistant

## 2019-07-14 ENCOUNTER — Ambulatory Visit (HOSPITAL_COMMUNITY)
Admission: RE | Admit: 2019-07-14 | Discharge: 2019-07-14 | Disposition: A | Discharge status: Home / Self Care | Payer: Medicare Other | Attending: Vascular Surgery | Admitting: Vascular Surgery

## 2019-07-14 ENCOUNTER — Encounter (HOSPITAL_COMMUNITY): Payer: Self-pay

## 2019-07-14 DIAGNOSIS — E785 Hyperlipidemia, unspecified: Secondary | ICD-10-CM | POA: Diagnosis not present

## 2019-07-14 DIAGNOSIS — I120 Hypertensive chronic kidney disease with stage 5 chronic kidney disease or end stage renal disease: Secondary | ICD-10-CM | POA: Insufficient documentation

## 2019-07-14 DIAGNOSIS — Z87891 Personal history of nicotine dependence: Secondary | ICD-10-CM | POA: Insufficient documentation

## 2019-07-14 DIAGNOSIS — N185 Chronic kidney disease, stage 5: Secondary | ICD-10-CM | POA: Diagnosis not present

## 2019-07-14 DIAGNOSIS — N186 End stage renal disease: Secondary | ICD-10-CM | POA: Insufficient documentation

## 2019-07-14 DIAGNOSIS — Z992 Dependence on renal dialysis: Secondary | ICD-10-CM | POA: Diagnosis not present

## 2019-07-14 HISTORY — PX: AV FISTULA PLACEMENT: SHX1204

## 2019-07-14 HISTORY — DX: Anxiety disorder, unspecified: F41.9

## 2019-07-14 HISTORY — DX: End stage renal disease: N18.6

## 2019-07-14 LAB — POCT I-STAT 4, (NA,K, GLUC, HGB,HCT)
Glucose, Bld: 92 mg/dL (ref 70–99)
HCT: 32 % — ABNORMAL LOW (ref 39.0–52.0)
Hemoglobin: 10.9 g/dL — ABNORMAL LOW (ref 13.0–17.0)
Potassium: 4.1 mmol/L (ref 3.5–5.1)
Sodium: 135 mmol/L (ref 135–145)

## 2019-07-14 SURGERY — INSERTION OF ARTERIOVENOUS (AV) GORE-TEX GRAFT ARM
Anesthesia: Monitor Anesthesia Care | Site: Arm Upper | Laterality: Left

## 2019-07-14 MED ORDER — LIDOCAINE-EPINEPHRINE (PF) 1 %-1:200000 IJ SOLN
INTRAMUSCULAR | Status: AC
  Filled 2019-07-14: qty 30

## 2019-07-14 MED ORDER — SODIUM CHLORIDE 0.9 % IV SOLN
INTRAVENOUS | Status: AC
  Filled 2019-07-14: qty 1.2

## 2019-07-14 MED ORDER — SODIUM CHLORIDE 0.9 % IV SOLN
INTRAVENOUS | Status: DC
  Administered 2019-07-14: 08:00:00 via INTRAVENOUS

## 2019-07-14 MED ORDER — LIDOCAINE HCL (CARDIAC) PF 100 MG/5ML IV SOSY
PREFILLED_SYRINGE | INTRAVENOUS | Status: DC | PRN
  Administered 2019-07-14: 40 mg via INTRATRACHEAL

## 2019-07-14 MED ORDER — ACETAMINOPHEN 160 MG/5ML PO SOLN: 1000.0000 mg | Freq: Once | ORAL | Status: DC | PRN

## 2019-07-14 MED ORDER — 0.9 % SODIUM CHLORIDE (POUR BTL) OPTIME
TOPICAL | Status: DC | PRN
  Administered 2019-07-14: 1000 mL

## 2019-07-14 MED ORDER — ACETAMINOPHEN 500 MG PO TABS: 1000.0000 mg | ORAL_TABLET | Freq: Once | ORAL | Status: DC | PRN

## 2019-07-14 MED ORDER — OXYCODONE HCL 5 MG/5ML PO SOLN: 5.0000 mg | Freq: Once | ORAL | Status: DC | PRN

## 2019-07-14 MED ORDER — PROPOFOL 500 MG/50ML IV EMUL
INTRAVENOUS | Status: DC | PRN
  Administered 2019-07-14: 50 ug/kg/min via INTRAVENOUS

## 2019-07-14 MED ORDER — OXYCODONE-ACETAMINOPHEN 5-325 MG PO TABS: 1.0000 | ORAL_TABLET | Freq: Four times a day (QID) | ORAL | 0 refills | Status: DC | PRN

## 2019-07-14 MED ORDER — SODIUM CHLORIDE 0.9 % IV SOLN
INTRAVENOUS | Status: DC | PRN
  Administered 2019-07-14: 25 ug/min via INTRAVENOUS

## 2019-07-14 MED ORDER — HEPARIN SODIUM (PORCINE) 1000 UNIT/ML IJ SOLN
INTRAMUSCULAR | Status: DC | PRN
  Administered 2019-07-14: 7000 [IU] via INTRAVENOUS

## 2019-07-14 MED ORDER — FENTANYL CITRATE (PF) 100 MCG/2ML IJ SOLN: 25.0000 ug | INTRAMUSCULAR | Status: DC | PRN

## 2019-07-14 MED ORDER — PROPOFOL 10 MG/ML IV BOLUS
INTRAVENOUS | Status: AC
  Filled 2019-07-14: qty 20

## 2019-07-14 MED ORDER — LIDOCAINE-EPINEPHRINE (PF) 1 %-1:200000 IJ SOLN
INTRAMUSCULAR | Status: DC | PRN
  Administered 2019-07-14: 30 mL

## 2019-07-14 MED ORDER — OXYCODONE HCL 5 MG PO TABS: 5.0000 mg | ORAL_TABLET | Freq: Once | ORAL | Status: DC | PRN

## 2019-07-14 MED ORDER — MIDAZOLAM HCL 2 MG/2ML IJ SOLN
INTRAMUSCULAR | Status: AC
  Filled 2019-07-14: qty 2

## 2019-07-14 MED ORDER — MIDAZOLAM HCL 5 MG/5ML IJ SOLN
INTRAMUSCULAR | Status: DC | PRN
  Administered 2019-07-14: 2 mg via INTRAVENOUS

## 2019-07-14 MED ORDER — CEFAZOLIN SODIUM-DEXTROSE 2-4 GM/100ML-% IV SOLN
2.0000 g | INTRAVENOUS | Status: AC
  Administered 2019-07-14: 2 g via INTRAVENOUS
  Filled 2019-07-14: qty 100

## 2019-07-14 MED ORDER — SODIUM CHLORIDE 0.9 % IV SOLN
INTRAVENOUS | Status: DC | PRN
  Administered 2019-07-14 (×2): via INTRAVENOUS

## 2019-07-14 MED ORDER — ACETAMINOPHEN 10 MG/ML IV SOLN: 1000.0000 mg | Freq: Once | INTRAVENOUS | Status: DC | PRN

## 2019-07-14 MED ORDER — SODIUM CHLORIDE 0.9 % IV SOLN
510.0000 mg | INTRAVENOUS | Status: DC
  Filled 2019-07-14: qty 17

## 2019-07-14 MED ORDER — SODIUM CHLORIDE 0.9 % IV SOLN
INTRAVENOUS | Status: DC | PRN
  Administered 2019-07-14: 500 mL

## 2019-07-14 MED ORDER — PROPOFOL 10 MG/ML IV BOLUS
INTRAVENOUS | Status: DC | PRN
  Administered 2019-07-14: 20 mg via INTRAVENOUS

## 2019-07-14 MED ORDER — FENTANYL CITRATE (PF) 100 MCG/2ML IJ SOLN
INTRAMUSCULAR | Status: DC | PRN
  Administered 2019-07-14 (×4): 25 ug via INTRAVENOUS
  Administered 2019-07-14: 50 ug via INTRAVENOUS

## 2019-07-14 MED ORDER — FENTANYL CITRATE (PF) 250 MCG/5ML IJ SOLN
INTRAMUSCULAR | Status: AC
  Filled 2019-07-14: qty 5

## 2019-07-14 MED ORDER — LIDOCAINE HCL (PF) 1 % IJ SOLN
INTRAMUSCULAR | Status: AC
  Filled 2019-07-14: qty 30

## 2019-07-14 MED ORDER — PROTAMINE SULFATE 10 MG/ML IV SOLN
INTRAVENOUS | Status: DC | PRN
  Administered 2019-07-14: 40 mg via INTRAVENOUS

## 2019-07-14 MED ORDER — LIDOCAINE HCL (PF) 1 % IJ SOLN
INTRAMUSCULAR | Status: DC | PRN
  Administered 2019-07-14: 17 mL

## 2019-07-14 MED ORDER — ONDANSETRON HCL 4 MG/2ML IJ SOLN
INTRAMUSCULAR | Status: DC | PRN
  Administered 2019-07-14: 4 mg via INTRAVENOUS

## 2019-07-14 SURGICAL SUPPLY — 39 items
ADH SKN CLS APL DERMABOND .7 (GAUZE/BANDAGES/DRESSINGS) ×1
ARMBAND PINK RESTRICT EXTREMIT (MISCELLANEOUS) ×4 IMPLANT
CANISTER SUCT 3000ML PPV (MISCELLANEOUS) ×2 IMPLANT
CANNULA VESSEL 3MM 2 BLNT TIP (CANNULA) ×2 IMPLANT
CLIP VESOCCLUDE MED 6/CT (CLIP) ×2 IMPLANT
CLIP VESOCCLUDE SM WIDE 6/CT (CLIP) ×2 IMPLANT
COVER WAND RF STERILE (DRAPES) ×2 IMPLANT
DECANTER SPIKE VIAL GLASS SM (MISCELLANEOUS) ×2 IMPLANT
DERMABOND ADVANCED (GAUZE/BANDAGES/DRESSINGS) ×1
DERMABOND ADVANCED .7 DNX12 (GAUZE/BANDAGES/DRESSINGS) ×1 IMPLANT
ELECT REM PT RETURN 9FT ADLT (ELECTROSURGICAL) ×2
ELECTRODE REM PT RTRN 9FT ADLT (ELECTROSURGICAL) ×1 IMPLANT
GLOVE BIO SURGEON STRL SZ 6.5 (GLOVE) ×3 IMPLANT
GLOVE BIO SURGEON STRL SZ7 (GLOVE) ×1 IMPLANT
GLOVE BIO SURGEON STRL SZ7.5 (GLOVE) ×3 IMPLANT
GLOVE BIOGEL PI IND STRL 6.5 (GLOVE) IMPLANT
GLOVE BIOGEL PI IND STRL 7.0 (GLOVE) IMPLANT
GLOVE BIOGEL PI IND STRL 8 (GLOVE) ×1 IMPLANT
GLOVE BIOGEL PI INDICATOR 6.5 (GLOVE) ×1
GLOVE BIOGEL PI INDICATOR 7.0 (GLOVE) ×1
GLOVE BIOGEL PI INDICATOR 8 (GLOVE) ×1
GOWN STRL REUS W/ TWL LRG LVL3 (GOWN DISPOSABLE) ×3 IMPLANT
GOWN STRL REUS W/TWL LRG LVL3 (GOWN DISPOSABLE) ×8
GRAFT GORETEX STRT 4-7X45 (Vascular Products) ×1 IMPLANT
KIT BASIN OR (CUSTOM PROCEDURE TRAY) ×2 IMPLANT
KIT TURNOVER KIT B (KITS) ×2 IMPLANT
NS IRRIG 1000ML POUR BTL (IV SOLUTION) ×2 IMPLANT
PACK CV ACCESS (CUSTOM PROCEDURE TRAY) ×2 IMPLANT
PAD ARMBOARD 7.5X6 YLW CONV (MISCELLANEOUS) ×4 IMPLANT
SPONGE SURGIFOAM ABS GEL 100 (HEMOSTASIS) IMPLANT
SUT PROLENE 6 0 BV (SUTURE) ×6 IMPLANT
SUT SILK 2 0 SH (SUTURE) ×1 IMPLANT
SUT VIC AB 3-0 SH 27 (SUTURE) ×6
SUT VIC AB 3-0 SH 27X BRD (SUTURE) ×2 IMPLANT
SUT VICRYL 4-0 PS2 18IN ABS (SUTURE) ×4 IMPLANT
SYR TOOMEY 50ML (SYRINGE) IMPLANT
TOWEL GREEN STERILE (TOWEL DISPOSABLE) ×2 IMPLANT
UNDERPAD 30X30 (UNDERPADS AND DIAPERS) ×2 IMPLANT
WATER STERILE IRR 1000ML POUR (IV SOLUTION) ×2 IMPLANT

## 2019-07-14 NOTE — Op Note (Signed)
    NAME: STEPHANO CAY    MRN: [id number] DOB: 1951-06-13    DATE OF OPERATION: 07/14/2019  PREOP DIAGNOSIS:    End-stage renal disease  POSTOP DIAGNOSIS:    Same  PROCEDURE:    New left forearm AV graft (4-7 mm PTFE graft)  SURGEON: Judeth Cornfield. Scot Dock, MD, FACS  ASSIST: Leontine Locket, PA  ANESTHESIA: Local with sedation  EBL: Minimal  INDICATIONS:    OLA LEEDER is a 68 y.o. male who presents for new access.  He has a functioning catheter.    FINDINGS:   Excellent thrill in left forearm AV graft.  Palpable radial pulse.  The venous end of the graft is tunneled along the ulnar aspect of the left forearm.  TECHNIQUE:   The patient was taken to the operating room and sedated by anesthesia.  The left upper extremity was prepped and draped in usual sterile fashion.  After the skin was anesthetized with 1% lidocaine, a longitudinal incision was made just above the antecubital space.  Here the brachial artery was dissected free and had a nice pulse it was a 4-1/2 mm artery.  The adjacent vein was dissected free.  Using 1 distal counterincision, after the skin was anesthetized, a 4-7 mm PTFE graft was tunneled in a loop fashion in the left forearm with the arterial aspect of the graft along the radial aspect of the forearm.  The venous aspect of the graft along the ulnar aspect of the forearm.  The patient was heparinized.  The brachial artery was clamped proximally and distally and a longitudinal arteriotomy was made.  A segment of the 4 mm into the graft was excised, the graft slightly spatulated, and sewn end-to-side to the artery using continuous 6-0 Prolene suture.  The graft then pulled the appropriate length for anastomosis to the brachial vein.  The vein was ligated distally and spatulated proximally.  The graft was cut the appropriate length, spatulated and sewn into into the vein using continuous 6-0 Prolene suture.  At the completion was an excellent thrill in the  graft and a palpable radial pulse.  Hemostasis was obtained in the wounds.  Each of the wounds was closed with a deep layer of 3-0 Vicryl and the skin closed with 4-0 Vicryl.  Dermabond was applied.  The patient tolerated the procedure well and was transferred to the recovery room in stable condition.  All needle and sponge counts were correct.  Deitra Mayo, MD, FACS [facility]  DATE OF DICTATION:   07/14/2019

## 2019-07-14 NOTE — Discharge Instructions (Signed)
° °  [facility]  Discharge Instructions  AV Fistula or Graft Surgery for Dialysis Access  Please refer to the following instructions for your post-procedure care. Your surgeon or physician assistant will discuss any changes with you.  Activity  You may drive the day following your surgery, if you are comfortable and no longer taking prescription pain medication. Resume full activity as the soreness in your incision resolves.  Bathing/Showering  You may shower after you go home. Keep your incision dry for 48 hours. Do not soak in a bathtub, hot tub, or swim until the incision heals completely. You may not shower if you have a hemodialysis catheter.  Incision Care  Clean your incision with mild soap and water after 48 hours. Pat the area dry with a clean towel. You do not need a bandage unless otherwise instructed. Do not apply any ointments or creams to your incision. You may have skin glue on your incision. Do not peel it off. It will come off on its own in about one week. Your arm may swell a bit after surgery. To reduce swelling use pillows to elevate your arm so it is above your heart. Your doctor will tell you if you need to lightly wrap your arm with an ACE bandage.  Diet  Resume your normal diet. There are not special food restrictions following this procedure. In order to heal from your surgery, it is CRITICAL to get adequate nutrition. Your body requires vitamins, minerals, and protein. Vegetables are the best source of vitamins and minerals. Vegetables also provide the perfect balance of protein. Processed food has little nutritional value, so try to avoid this.  Medications  Resume taking all of your medications. If your incision is causing pain, you may take over-the counter pain relievers such as acetaminophen (Tylenol). If you were prescribed a stronger pain medication, please be aware these medications can cause nausea and constipation. Prevent  nausea by taking the medication with a snack or meal. Avoid constipation by drinking plenty of fluids and eating foods with high amount of fiber, such as fruits, vegetables, and grains.  Do not take Tylenol if you are taking prescription pain medications.  Follow up Your surgeon may want to see you in the office following your access surgery. If so, this will be arranged at the time of your surgery.  Please call us immediately for any of the following conditions:  Increased pain, redness, drainage (pus) from your incision site Fever of 101 degrees or higher Severe or worsening pain at your incision site Hand pain or numbness.  Reduce your risk of vascular disease:  Stop smoking. If you would like help, call [facility] at 1-800-QUIT-NOW 424-633-3130) or [place] at [place] your cholesterol Maintain a desired weight Control your diabetes Keep your blood pressure down  Dialysis  It will take several weeks to several months for your new dialysis access to be ready for use. Your surgeon will determine when it is okay to use it. Your nephrologist will continue to direct your dialysis. You can continue to use your Permcath until your new access is ready for use.   07/14/2019 JAISEN HARBECK [id number] 1950-11-11  Surgeon(s): Angelia Mould, MD  Procedure(s): INSERTION OF ARTERIOVENOUS (AV) GORE-TEX GRAFT ARM LEFT FOREARM LOOP GRAFT  x Do not stick graft for 4 weeks    If you have any questions, please call the office at [phone].

## 2019-07-14 NOTE — Interval H&P Note (Signed)
History and Physical Interval Note:  07/14/2019 8:58 AM  Jesus Ayala  has presented today for surgery, with the diagnosis of end stage renal disease.  The various methods of treatment have been discussed with the patient and family. After consideration of risks, benefits and other options for treatment, the patient has consented to  Procedure(s): INSERTION OF ARTERIOVENOUS (AV) GORE-TEX GRAFT ARM (Left) as a surgical intervention.  The patient's history has been reviewed, patient examined, no change in status, stable for surgery.  I have reviewed the patient's chart and labs.  Questions were answered to the patient's satisfaction.     Deitra Mayo

## 2019-07-14 NOTE — Transfer of Care (Signed)
Immediate Anesthesia Transfer of Care Note  Patient: Jesus Ayala  Procedure(s) Performed: INSERTION OF ARTERIOVENOUS (AV) GORE-TEX GRAFT ARM (Left Arm Upper)  Patient Location: PACU  Anesthesia Type:MAC  Level of Consciousness: awake, alert  and oriented  Airway & Oxygen Therapy: Patient Spontanous Breathing and Patient connected to nasal cannula oxygen  Post-op Assessment: Report given to RN, Post -op Vital signs reviewed and stable and Patient moving all extremities X 4  Post vital signs: Reviewed and stable  Last Vitals:  Vitals Value Taken Time  BP 171/76 07/14/19 1123  Temp    Pulse 79 07/14/19 1123  Resp 10 07/14/19 1123  SpO2 97 % 07/14/19 1123  Vitals shown include unvalidated device data.  Last Pain:  Vitals:   07/14/19 0718  TempSrc:   PainSc: 0-No pain         Complications: No apparent anesthesia complications

## 2019-07-15 ENCOUNTER — Encounter (HOSPITAL_COMMUNITY): Payer: Self-pay | Admitting: Vascular Surgery

## 2019-07-15 DIAGNOSIS — Z992 Dependence on renal dialysis: Secondary | ICD-10-CM | POA: Diagnosis not present

## 2019-07-15 DIAGNOSIS — N186 End stage renal disease: Secondary | ICD-10-CM | POA: Diagnosis not present

## 2019-07-15 DIAGNOSIS — E876 Hypokalemia: Secondary | ICD-10-CM | POA: Diagnosis not present

## 2019-07-15 DIAGNOSIS — N2581 Secondary hyperparathyroidism of renal origin: Secondary | ICD-10-CM | POA: Diagnosis not present

## 2019-07-15 DIAGNOSIS — Z23 Encounter for immunization: Secondary | ICD-10-CM | POA: Diagnosis not present

## 2019-07-15 DIAGNOSIS — D509 Iron deficiency anemia, unspecified: Secondary | ICD-10-CM | POA: Diagnosis not present

## 2019-07-15 NOTE — Anesthesia Postprocedure Evaluation (Signed)
Anesthesia Post Note  Patient: Jesus Ayala  Procedure(s) Performed: INSERTION OF ARTERIOVENOUS (AV) GORE-TEX GRAFT ARM (Left Arm Upper)     Patient location during evaluation: PACU Anesthesia Type: MAC Level of consciousness: awake and alert Pain management: pain level controlled Vital Signs Assessment: post-procedure vital signs reviewed and stable Respiratory status: spontaneous breathing, nonlabored ventilation, respiratory function stable and patient connected to nasal cannula oxygen Cardiovascular status: stable and blood pressure returned to baseline Postop Assessment: no apparent nausea or vomiting Anesthetic complications: no    Last Vitals:  Vitals:   07/14/19 1230 07/14/19 1245  BP:  (!) 141/73  Pulse:  71  Resp:  15  Temp: (!) 36.4 C   SpO2:  100%    Last Pain:  Vitals:   07/14/19 1245  TempSrc:   PainSc: 0-No pain                 Angelisse Riso

## 2019-07-17 DIAGNOSIS — E876 Hypokalemia: Secondary | ICD-10-CM | POA: Diagnosis not present

## 2019-07-17 DIAGNOSIS — Z992 Dependence on renal dialysis: Secondary | ICD-10-CM | POA: Diagnosis not present

## 2019-07-17 DIAGNOSIS — Z23 Encounter for immunization: Secondary | ICD-10-CM | POA: Diagnosis not present

## 2019-07-17 DIAGNOSIS — D509 Iron deficiency anemia, unspecified: Secondary | ICD-10-CM | POA: Diagnosis not present

## 2019-07-17 DIAGNOSIS — N186 End stage renal disease: Secondary | ICD-10-CM | POA: Diagnosis not present

## 2019-07-17 DIAGNOSIS — N2581 Secondary hyperparathyroidism of renal origin: Secondary | ICD-10-CM | POA: Diagnosis not present

## 2019-07-20 DIAGNOSIS — Z23 Encounter for immunization: Secondary | ICD-10-CM | POA: Diagnosis not present

## 2019-07-20 DIAGNOSIS — Z992 Dependence on renal dialysis: Secondary | ICD-10-CM | POA: Diagnosis not present

## 2019-07-20 DIAGNOSIS — N186 End stage renal disease: Secondary | ICD-10-CM | POA: Diagnosis not present

## 2019-07-20 DIAGNOSIS — E876 Hypokalemia: Secondary | ICD-10-CM | POA: Diagnosis not present

## 2019-07-20 DIAGNOSIS — D509 Iron deficiency anemia, unspecified: Secondary | ICD-10-CM | POA: Diagnosis not present

## 2019-07-20 DIAGNOSIS — N2581 Secondary hyperparathyroidism of renal origin: Secondary | ICD-10-CM | POA: Diagnosis not present

## 2019-07-21 ENCOUNTER — Ambulatory Visit (INDEPENDENT_AMBULATORY_CARE_PROVIDER_SITE_OTHER): Payer: Self-pay | Admitting: Physician Assistant

## 2019-07-21 ENCOUNTER — Other Ambulatory Visit: Payer: Self-pay

## 2019-07-21 VITALS — BP 140/77 | HR 72 | Temp 97.8°F | Resp 16 | Ht 71.0 in | Wt 165.0 lb

## 2019-07-21 DIAGNOSIS — N186 End stage renal disease: Secondary | ICD-10-CM

## 2019-07-21 DIAGNOSIS — Z992 Dependence on renal dialysis: Secondary | ICD-10-CM

## 2019-07-21 MED ORDER — OXYCODONE-ACETAMINOPHEN 5-325 MG PO TABS: 1.0000 | ORAL_TABLET | Freq: Four times a day (QID) | ORAL | 0 refills | Status: DC | PRN

## 2019-07-21 NOTE — Progress Notes (Signed)
POST OPERATIVE OFFICE NOTE    CC:  F/u for surgery  HPI:  This is a 68 y.o. male who is s/p new left FA AVG on 07/14/2019 by Dr. Scot Dock.  He comes in today with c/o swelling in the operative arm.  He states that has gotten a little better.  He is having some pain with this.  He denies any pain or numbness in his left hand.   He dialyzes on M/W/F at the [place] location.    Allergies  Allergen Reactions  . Ace Inhibitors Swelling and Other (See Comments)  . Enalapril Swelling    ANGIOEDEMA of lips  . Iron Sucrose Itching    Current Outpatient Medications  Medication Sig Dispense Refill  . acetaminophen (TYLENOL) 500 MG tablet Take 500-1,000 mg by mouth every 6 (six) hours as needed (pain).    [name] amLODipine (NORVASC) 10 MG tablet Take 10 mg by mouth daily.    [name] aspirin 81 MG chewable tablet Chew 81 mg by mouth daily.    . B Complex-C-Zn-Folic Acid (DIALYVITE AB-123456789 15 PO) Take 1 tablet by mouth daily at 3 pm.    . calcitRIOL (ROCALTROL) 0.25 MCG capsule Take 0.25 mcg by mouth daily.    . calcium acetate (PHOSLO) 667 MG capsule Take 1 capsule (667 mg total) by mouth 3 (three) times daily with meals. (Patient taking differently: Take 1,334 mg by mouth 3 (three) times daily with meals. ) 90 capsule 0  . furosemide (LASIX) 80 MG tablet Take 80 mg by mouth daily.    . hydrALAZINE (APRESOLINE) 25 MG tablet Take 1 tablet (25 mg total) by mouth 3 (three) times daily. 90 tablet 0  . latanoprost (XALATAN) 0.005 % ophthalmic solution Place 1 drop into both eyes at bedtime.    . metoprolol tartrate (LOPRESSOR) 100 MG tablet Take 100 mg by mouth 2 (two) times a day.    . nitroGLYCERIN (NITROSTAT) 0.4 MG SL tablet Place 0.4 mg under the tongue every 5 (five) minutes as needed for chest pain.     [name] oxyCODONE-acetaminophen (PERCOCET) 5-325 MG tablet Take 1 tablet by mouth every 6 (six) hours as needed. 15 tablet 0  . traZODone (DESYREL) 50 MG tablet Take 50 mg by mouth at bedtime.     No  current facility-administered medications for this visit.      ROS:  See HPI  Physical Exam:  Today's Vitals   07/21/19 1254  BP: 140/77  Pulse: 72  Resp: 16  Temp: 97.8 F (36.6 C)  TempSrc: Temporal  SpO2: 100%  Weight: 165 lb (74.8 kg)  Height: 5\' 11"  (1.803 m)  PainSc: 6    Body mass index is 23.01 kg/m.   Incision:  Antecubital incision and distal incisions are both healing Extremities:  + palpable left radial pulse; there is an excellent thrill in the graft.  He does have generalized swelling throughout the arm below the elbow down onto the hand.  Motor and sensory are in tact left hand.    Assessment/Plan:  This is a 68 y.o. male who is s/p: new left FA AVG on 07/14/2019 by Dr. Scot Dock who comes in today with c/o left arm swelling.    -he does have generalized swelling of the left arm below the elbow.  This is most likely due to tunneling for the graft.  I have advised him to keep his arm elevated as much as possible.  He continues to have pain from this.  I did give  him Percocet 5/325 one q6h prn pain #10 (ten) no refill.  I told him that we will not give him anymore pain medicine after this.  -he does not have steal sx as he does not have pain in his hand and has a palpable left radial pulse.  -he has f/u appt on 10/7 that he will keep    Leontine Locket, PA-C [facility] [phone]  Clinic MD:  Early

## 2019-07-22 DIAGNOSIS — E876 Hypokalemia: Secondary | ICD-10-CM | POA: Diagnosis not present

## 2019-07-22 DIAGNOSIS — N2581 Secondary hyperparathyroidism of renal origin: Secondary | ICD-10-CM | POA: Diagnosis not present

## 2019-07-22 DIAGNOSIS — N186 End stage renal disease: Secondary | ICD-10-CM | POA: Diagnosis not present

## 2019-07-22 DIAGNOSIS — D509 Iron deficiency anemia, unspecified: Secondary | ICD-10-CM | POA: Diagnosis not present

## 2019-07-22 DIAGNOSIS — Z992 Dependence on renal dialysis: Secondary | ICD-10-CM | POA: Diagnosis not present

## 2019-07-22 DIAGNOSIS — Z23 Encounter for immunization: Secondary | ICD-10-CM | POA: Diagnosis not present

## 2019-07-23 DIAGNOSIS — N186 End stage renal disease: Secondary | ICD-10-CM | POA: Diagnosis not present

## 2019-07-23 DIAGNOSIS — Z992 Dependence on renal dialysis: Secondary | ICD-10-CM | POA: Diagnosis not present

## 2019-07-23 DIAGNOSIS — I129 Hypertensive chronic kidney disease with stage 1 through stage 4 chronic kidney disease, or unspecified chronic kidney disease: Secondary | ICD-10-CM | POA: Diagnosis not present

## 2019-07-24 ENCOUNTER — Encounter (HOSPITAL_COMMUNITY): Payer: Self-pay | Admitting: Emergency Medicine

## 2019-07-24 ENCOUNTER — Other Ambulatory Visit: Payer: Self-pay

## 2019-07-24 ENCOUNTER — Emergency Department (HOSPITAL_COMMUNITY)
Admission: EM | Admit: 2019-07-24 | Discharge: 2019-07-24 | Disposition: A | Discharge status: Home / Self Care | Payer: Medicare Other | Attending: Emergency Medicine | Admitting: Emergency Medicine

## 2019-07-24 DIAGNOSIS — R1013 Epigastric pain: Secondary | ICD-10-CM | POA: Diagnosis not present

## 2019-07-24 DIAGNOSIS — R197 Diarrhea, unspecified: Secondary | ICD-10-CM | POA: Insufficient documentation

## 2019-07-24 DIAGNOSIS — N186 End stage renal disease: Secondary | ICD-10-CM | POA: Insufficient documentation

## 2019-07-24 DIAGNOSIS — Z79899 Other long term (current) drug therapy: Secondary | ICD-10-CM | POA: Insufficient documentation

## 2019-07-24 DIAGNOSIS — I120 Hypertensive chronic kidney disease with stage 5 chronic kidney disease or end stage renal disease: Secondary | ICD-10-CM | POA: Insufficient documentation

## 2019-07-24 DIAGNOSIS — Z992 Dependence on renal dialysis: Secondary | ICD-10-CM | POA: Insufficient documentation

## 2019-07-24 DIAGNOSIS — Z87891 Personal history of nicotine dependence: Secondary | ICD-10-CM | POA: Insufficient documentation

## 2019-07-24 DIAGNOSIS — Z23 Encounter for immunization: Secondary | ICD-10-CM | POA: Diagnosis not present

## 2019-07-24 DIAGNOSIS — N2581 Secondary hyperparathyroidism of renal origin: Secondary | ICD-10-CM | POA: Diagnosis not present

## 2019-07-24 DIAGNOSIS — Z7982 Long term (current) use of aspirin: Secondary | ICD-10-CM | POA: Insufficient documentation

## 2019-07-24 DIAGNOSIS — E876 Hypokalemia: Secondary | ICD-10-CM | POA: Diagnosis not present

## 2019-07-24 DIAGNOSIS — D631 Anemia in chronic kidney disease: Secondary | ICD-10-CM | POA: Diagnosis not present

## 2019-07-24 LAB — CBC
HCT: 28.2 % — ABNORMAL LOW (ref 39.0–52.0)
Hemoglobin: 9.4 g/dL — ABNORMAL LOW (ref 13.0–17.0)
MCH: 29.8 pg (ref 26.0–34.0)
MCHC: 33.3 g/dL (ref 30.0–36.0)
MCV: 89.5 fL (ref 80.0–100.0)
Platelets: 272 10*3/uL (ref 150–400)
RBC: 3.15 MIL/uL — ABNORMAL LOW (ref 4.22–5.81)
RDW: 15.6 % — ABNORMAL HIGH (ref 11.5–15.5)
WBC: 8.7 10*3/uL (ref 4.0–10.5)
nRBC: 0 % (ref 0.0–0.2)

## 2019-07-24 LAB — COMPREHENSIVE METABOLIC PANEL
ALT: 11 U/L (ref 0–44)
AST: 18 U/L (ref 15–41)
Albumin: 4.1 g/dL (ref 3.5–5.0)
Alkaline Phosphatase: 57 U/L (ref 38–126)
Anion gap: 18 — ABNORMAL HIGH (ref 5–15)
BUN: 28 mg/dL — ABNORMAL HIGH (ref 8–23)
CO2: 23 mmol/L (ref 22–32)
Calcium: 9.6 mg/dL (ref 8.9–10.3)
Chloride: 93 mmol/L — ABNORMAL LOW (ref 98–111)
Creatinine, Ser: 8.15 mg/dL — ABNORMAL HIGH (ref 0.61–1.24)
GFR calc Af Amer: 7 mL/min — ABNORMAL LOW (ref >60)
GFR calc non Af Amer: 6 mL/min — ABNORMAL LOW (ref >60)
Glucose, Bld: 96 mg/dL (ref 70–99)
Potassium: 4.1 mmol/L (ref 3.5–5.1)
Sodium: 134 mmol/L — ABNORMAL LOW (ref 135–145)
Total Bilirubin: 0.8 mg/dL (ref 0.3–1.2)
Total Protein: 7.7 g/dL (ref 6.5–8.1)

## 2019-07-24 LAB — LIPASE, BLOOD: Lipase: 28 U/L (ref 11–51)

## 2019-07-24 MED ORDER — LOPERAMIDE HCL 2 MG PO CAPS
4.0000 mg | ORAL_CAPSULE | Freq: Once | ORAL | Status: AC
  Administered 2019-07-24: 10:00:00 4 mg via ORAL
  Filled 2019-07-24: qty 2

## 2019-07-24 MED ORDER — HYDROCORTISONE ACETATE 25 MG RE SUPP: 25.0000 mg | Freq: Two times a day (BID) | RECTAL | 0 refills | Status: DC

## 2019-07-24 MED ORDER — HYDROCORTISONE (PERIANAL) 2.5 % EX CREA: 1.0000 "application " | TOPICAL_CREAM | Freq: Two times a day (BID) | CUTANEOUS | 0 refills | Status: DC

## 2019-07-24 NOTE — ED Triage Notes (Signed)
Pt to ER for 3 days of diarrhea without associated abdominal pain. Reports taking peptobismol without relief. NAD at this time. VS stable, blood pressure elevated reports has not taken medication. States he was constipated the day prior to onset of diarrhea and took dulcolax.

## 2019-07-24 NOTE — ED Notes (Signed)
Reports on dialysis, has not missed any treatments but will be missing today likely due to being in ER. Schedule M/W/F

## 2019-07-24 NOTE — ED Provider Notes (Signed)
[facility] EMERGENCY DEPARTMENT Provider Note   CSN: [id number] Arrival date & time: 07/24/19  [id number]     History   Chief Complaint Chief Complaint  Patient presents with  . Diarrhea    HPI EHAB LEANDRO is a 68 y.o. male.     68 yo M with a chief complaint of diarrhea.  Going on for the past couple days.  Preceded by constipation.  Took a Dulcolax suppository and then started having frequent bowel movements.  Describes it as constant and watery.  Non-dark nonbloody.  Has had some mild epigastric abdominal discomfort.  Has had some hemorrhoids with this as well.  Has tried Pepto-Bismol as well as Imodium at home.  Has taken 1 dose of Imodium without improvement.  Denies suspicious food intake denies recent travel.  Denies fevers.  The history is provided by the patient and the spouse.  Diarrhea Quality:  Watery Severity:  Moderate Onset quality:  Gradual Duration:  2 days Timing:  Constant Progression:  Worsening Relieved by:  Nothing Worsened by:  Nothing Ineffective treatments:  None tried Associated symptoms: abdominal pain (mild epigastric)   Associated symptoms: no arthralgias, no chills, no fever, no headaches, no myalgias and no vomiting     Past Medical History:  Diagnosis Date  . Anemia   . Anxiety    situational  . ESRD (end stage renal disease) ([place])    MWF- [place]  . HLD (hyperlipidemia)   . Hypertension     Patient Active Problem List   Diagnosis Date Noted  . Acute blood loss anemia 06/25/2019  . ESRD (end stage renal disease) ([place]) 06/25/2019  . Complication of dialysis access insertion 06/25/2019  . Acute renal failure superimposed on stage 3 chronic kidney disease ([place]) 04/19/2019  . Chest pain 04/19/2019  . Hypokalemia 04/19/2019  . Normocytic anemia 04/19/2019  . HLD (hyperlipidemia)   . Hypertension     Past Surgical History:  Procedure Laterality Date  . AV FISTULA PLACEMENT Left 07/14/2019   Procedure: INSERTION OF ARTERIOVENOUS (AV) GORE-TEX GRAFT ARM;  Surgeon: Angelia Mould, MD;  Location: [facility];  Service: Vascular;  Laterality: Left;  . COLONOSCOPY    . HERNIA REPAIR     unbicial  . INSERTION OF DIALYSIS CATHETER Right 06/25/2019   Procedure: INSERTION OF TUNNEL DIALYSIS CATHETER;  Surgeon: Waynetta Sandy, MD;  Location: [facility];  Service: Vascular;  Laterality: Right;        Home Medications    Prior to Admission medications   Medication Sig Start Date End Date Taking? Authorizing Provider  acetaminophen (TYLENOL) 500 MG tablet Take 500-1,000 mg by mouth every 6 (six) hours as needed (pain).    [provider]  amLODipine (NORVASC) 10 MG tablet Take 10 mg by mouth daily. 04/12/19   [provider]  aspirin 81 MG chewable tablet Chew 81 mg by mouth daily.    [provider]  B Complex-C-Zn-Folic Acid (DIALYVITE AB-123456789 15 PO) Take 1 tablet by mouth daily at 3 pm.    [provider]  calcitRIOL (ROCALTROL) 0.25 MCG capsule Take 0.25 mcg by mouth daily. 06/12/19   [provider]  calcium acetate (PHOSLO) 667 MG capsule Take 1 capsule (667 mg total) by mouth 3 (three) times daily with meals. Patient taking differently: Take 1,334 mg by mouth 3 (three) times daily with meals.  04/22/19   Lavina Hamman, MD  furosemide (LASIX) 80 MG tablet Take 80 mg by mouth  daily. 01/22/19   [provider]  hydrALAZINE (APRESOLINE) 25 MG tablet Take 1 tablet (25 mg total) by mouth 3 (three) times daily. 04/22/19   Lavina Hamman, MD  hydrocortisone (ANUSOL-HC) 25 MG suppository Place 1 suppository (25 mg total) rectally 2 (two) times daily. For 7 days 07/24/19   Deno Etienne, DO  latanoprost (XALATAN) 0.005 % ophthalmic solution Place 1 drop into both eyes at bedtime.    [provider]  metoprolol tartrate (LOPRESSOR) 100 MG tablet Take 100 mg by mouth 2 (two) times a day. 04/12/19   [provider]   nitroGLYCERIN (NITROSTAT) 0.4 MG SL tablet Place 0.4 mg under the tongue every 5 (five) minutes as needed for chest pain.  03/27/19   [provider]  oxyCODONE-acetaminophen (PERCOCET) 5-325 MG tablet Take 1 tablet by mouth every 6 (six) hours as needed. 07/21/19   Rhyne, Hulen Shouts, PA-C  traZODone (DESYREL) 50 MG tablet Take 50 mg by mouth at bedtime.    [provider]    Family History Family History  Problem Relation Age of Onset  . Lung cancer Father   . Hypertension Brother     Social History Social History   Tobacco Use  . Smoking status: Former Smoker    Years: 2.00    Quit date: 1987    Years since quitting: 33.7  . Smokeless tobacco: Never Used  Substance Use Topics  . Alcohol use: Not Currently  . Drug use: No     Allergies   Ace inhibitors, Enalapril, and Iron sucrose   Review of Systems Review of Systems  Constitutional: Negative for chills and fever.  HENT: Negative for congestion and facial swelling.   Eyes: Negative for discharge and visual disturbance.  Respiratory: Negative for shortness of breath.   Cardiovascular: Negative for chest pain and palpitations.  Gastrointestinal: Positive for abdominal pain (mild epigastric) and diarrhea. Negative for vomiting.  Musculoskeletal: Negative for arthralgias and myalgias.  Skin: Negative for color change and rash.  Neurological: Negative for tremors, syncope and headaches.  Psychiatric/Behavioral: Negative for confusion and dysphoric mood.     Physical Exam Updated Vital Signs BP (!) 179/97   Pulse 86   Temp 98.2 F (36.8 C) (Oral)   Resp 16   SpO2 99%   Physical Exam Vitals signs and nursing note reviewed.  Constitutional:      Appearance: He is well-developed.  HENT:     Head: Normocephalic and atraumatic.  Eyes:     Pupils: Pupils are equal, round, and reactive to light.  Neck:     Musculoskeletal: Normal range of motion and neck supple.     Vascular: No JVD.   Cardiovascular:     Rate and Rhythm: Normal rate and regular rhythm.     Heart sounds: No murmur. No friction rub. No gallop.      Comments: Right tunneled dialysis catheter without erythema drainage or tenderness Pulmonary:     Effort: No respiratory distress.     Breath sounds: No wheezing.  Abdominal:     General: There is no distension.     Tenderness: There is no abdominal tenderness. There is no guarding or rebound.     Comments: Benign abdominal exam  Musculoskeletal: Normal range of motion.  Skin:    Coloration: Skin is not pale.     Findings: No rash.  Neurological:     Mental Status: He is alert and oriented to person, place, and time.  Psychiatric:  Behavior: Behavior normal.      ED Treatments / Results  Labs (all labs ordered are listed, but only abnormal results are displayed) Labs Reviewed  COMPREHENSIVE METABOLIC PANEL - Abnormal; Notable for the following components:      Result Value   Sodium 134 (*)    Chloride 93 (*)    BUN 28 (*)    Creatinine, Ser 8.15 (*)    GFR calc non Af Amer 6 (*)    GFR calc Af Amer 7 (*)    Anion gap 18 (*)    All other components within normal limits  CBC - Abnormal; Notable for the following components:   RBC 3.15 (*)    Hemoglobin 9.4 (*)    HCT 28.2 (*)    RDW 15.6 (*)    All other components within normal limits  LIPASE, BLOOD  URINALYSIS, ROUTINE W REFLEX MICROSCOPIC    EKG None  Radiology No results found.  Procedures Procedures (including critical care time)  Medications Ordered in ED Medications  loperamide (IMODIUM) capsule 4 mg (has no administration in time range)     Initial Impression / Assessment and Plan / ED Course  I have reviewed the triage vital signs and the nursing notes.  Pertinent labs & imaging results that were available during my care of the patient were reviewed by me and considered in my medical decision making (see chart for details).        68 yo M with a chief  complaint of diarrhea.  This started after taking a Dulcolax suppository.  Patient is well-appearing and nontoxic.  He has no abdominal pain on my exam.  History has had no blood in his stool or dark stool.  Hemoglobin appears to be slightly higher than his baseline.  No LFT elevation, lipase is normal.  Unlikely to be acute hepatitis.  He does have an elevated anion gap without acidosis which is likely due to the diarrhea.  He is scheduled for dialysis in a couple hours.  I will have him continue to take Imodium.  Have him follow-up with dialysis.  Return precautions given.  10:25 AM:  I have discussed the diagnosis/risks/treatment options with the patient and family and believe the pt to be eligible for discharge home to follow-up with PCP, nephrology. We also discussed returning to the ED immediately if new or worsening sx occur. We discussed the sx which are most concerning (e.g., sudden worsening pain, fever, inability to tolerate by mouth) that necessitate immediate return. Medications administered to the patient during their visit and any new prescriptions provided to the patient are listed below.  Medications given during this visit Medications  loperamide (IMODIUM) capsule 4 mg (has no administration in time range)     The patient appears reasonably screen and/or stabilized for discharge and I doubt any other medical condition or other [facility] requiring further screening, evaluation, or treatment in the ED at this time prior to discharge.    Final Clinical Impressions(s) / ED Diagnoses   Final diagnoses:  Diarrhea, unspecified type    ED Discharge Orders         Ordered    hydrocortisone (ANUSOL-HC) 25 MG suppository  2 times daily     07/24/19 [place], Veena Sturgess, DO 07/24/19 1025

## 2019-07-24 NOTE — Discharge Instructions (Addendum)
As discussed before please return for fever if your stool looks dark or bloody or if your abdominal pain worsens.  Weakness is also a common symptom and so if this gets worse please return.  As discussed you can try and replace your losses with Pedialyte or Gatorade.  You can water the Gatorade down if you like.  Please discuss this with your nephrologist as they may have specific instructions for people newly starting dialysis.  Hemorrhoids are usually caused by forcefully bearing down while you are on the toilet. Use the suppositories.  Stool softeners can help you with this however I would hold off while you are currently having diarrhea.

## 2019-07-27 DIAGNOSIS — N2581 Secondary hyperparathyroidism of renal origin: Secondary | ICD-10-CM | POA: Diagnosis not present

## 2019-07-27 DIAGNOSIS — E876 Hypokalemia: Secondary | ICD-10-CM | POA: Diagnosis not present

## 2019-07-27 DIAGNOSIS — Z23 Encounter for immunization: Secondary | ICD-10-CM | POA: Diagnosis not present

## 2019-07-27 DIAGNOSIS — D631 Anemia in chronic kidney disease: Secondary | ICD-10-CM | POA: Diagnosis not present

## 2019-07-27 DIAGNOSIS — N186 End stage renal disease: Secondary | ICD-10-CM | POA: Diagnosis not present

## 2019-07-27 DIAGNOSIS — Z992 Dependence on renal dialysis: Secondary | ICD-10-CM | POA: Diagnosis not present

## 2019-07-29 DIAGNOSIS — N2581 Secondary hyperparathyroidism of renal origin: Secondary | ICD-10-CM | POA: Diagnosis not present

## 2019-07-29 DIAGNOSIS — N186 End stage renal disease: Secondary | ICD-10-CM | POA: Diagnosis not present

## 2019-07-29 DIAGNOSIS — D631 Anemia in chronic kidney disease: Secondary | ICD-10-CM | POA: Diagnosis not present

## 2019-07-29 DIAGNOSIS — E876 Hypokalemia: Secondary | ICD-10-CM | POA: Diagnosis not present

## 2019-07-29 DIAGNOSIS — Z23 Encounter for immunization: Secondary | ICD-10-CM | POA: Diagnosis not present

## 2019-07-29 DIAGNOSIS — Z992 Dependence on renal dialysis: Secondary | ICD-10-CM | POA: Diagnosis not present

## 2019-07-30 ENCOUNTER — Other Ambulatory Visit: Payer: Self-pay

## 2019-07-30 ENCOUNTER — Ambulatory Visit (INDEPENDENT_AMBULATORY_CARE_PROVIDER_SITE_OTHER): Payer: Self-pay | Admitting: Physician Assistant

## 2019-07-30 VITALS — BP 142/74 | HR 79 | Resp 18 | Ht 71.0 in | Wt 164.0 lb

## 2019-07-30 DIAGNOSIS — N186 End stage renal disease: Secondary | ICD-10-CM

## 2019-07-30 DIAGNOSIS — Z992 Dependence on renal dialysis: Secondary | ICD-10-CM

## 2019-07-30 NOTE — Progress Notes (Signed)
  POST OPERATIVE OFFICE NOTE    CC:  F/u for surgery  HPI:  This is a 68 y.o. male who is s/p left forearm AV graft (4-7 mm PTFE graft).  He is on HD with a functioning [facility] 07/14/2019.  He has had significant edema in the left forearm and hand since surgery.  He is here today for f/u.  He is on HD via [facility] currently.  He denise pain and loss of sensation.  He continues to have residual edema and tenderness to palpation over the Loop fore arm graft.   Allergies  Allergen Reactions  . Ace Inhibitors Swelling and Other (See Comments)  . Enalapril Swelling    ANGIOEDEMA of lips  . Iron Sucrose Itching    Current Outpatient Medications  Medication Sig Dispense Refill  . acetaminophen (TYLENOL) 500 MG tablet Take 500-1,000 mg by mouth every 6 (six) hours as needed (pain).    [name] amLODipine (NORVASC) 10 MG tablet Take 10 mg by mouth daily.    [name] aspirin 81 MG chewable tablet Chew 81 mg by mouth daily.    . B Complex-C-Zn-Folic Acid (DIALYVITE AB-123456789 15 PO) Take 1 tablet by mouth daily at 3 pm.    . calcitRIOL (ROCALTROL) 0.25 MCG capsule Take 0.25 mcg by mouth daily.    . calcium acetate (PHOSLO) 667 MG capsule Take 1 capsule (667 mg total) by mouth 3 (three) times daily with meals. (Patient taking differently: Take 1,334 mg by mouth 3 (three) times daily with meals. ) 90 capsule 0  . furosemide (LASIX) 80 MG tablet Take 80 mg by mouth daily.    . hydrALAZINE (APRESOLINE) 25 MG tablet Take 1 tablet (25 mg total) by mouth 3 (three) times daily. 90 tablet 0  . hydrocortisone (ANUSOL-HC) 2.5 % rectal cream Place 1 application rectally 2 (two) times daily. 30 g 0  . hydrocortisone (ANUSOL-HC) 25 MG suppository Place 1 suppository (25 mg total) rectally 2 (two) times daily. For 7 days 14 suppository 0  . latanoprost (XALATAN) 0.005 % ophthalmic solution Place 1 drop into both eyes at bedtime.    . metoprolol tartrate (LOPRESSOR) 100 MG tablet Take 100 mg by mouth 2 (two) times a day.    .  nitroGLYCERIN (NITROSTAT) 0.4 MG SL tablet Place 0.4 mg under the tongue every 5 (five) minutes as needed for chest pain.     [name] oxyCODONE-acetaminophen (PERCOCET) 5-325 MG tablet Take 1 tablet by mouth every 6 (six) hours as needed. 10 tablet 0  . traZODone (DESYREL) 50 MG tablet Take 50 mg by mouth at bedtime.     No current facility-administered medications for this visit.      ROS:  See HPI  Physical Exam:    Incision:  Well healed with out erythema or wounds. Extremities:  Sensation intact, motor grip 5/5.  Tenderness to palpation lateral loop.  No fore arm erythema.  Palpable thrill in graft.  Assessment/Plan:  This is a 68 y.o. male who is s/p:Left AV forearm graft with prolonged edema and tenderness   Possible Gore reaction.  It seems to be resolving slowly.  We will wait until Nov. 06/2019 before we access the loop graft for HD to allow full resolution of symptoms.  One the graft is accessed successfully he can be scheduled for [facility] removal.     Roxy Horseman PA-C [facility] [phone]  Clinic MD:  Oneida Alar

## 2019-07-31 DIAGNOSIS — D631 Anemia in chronic kidney disease: Secondary | ICD-10-CM | POA: Diagnosis not present

## 2019-07-31 DIAGNOSIS — N186 End stage renal disease: Secondary | ICD-10-CM | POA: Diagnosis not present

## 2019-07-31 DIAGNOSIS — Z992 Dependence on renal dialysis: Secondary | ICD-10-CM | POA: Diagnosis not present

## 2019-07-31 DIAGNOSIS — Z23 Encounter for immunization: Secondary | ICD-10-CM | POA: Diagnosis not present

## 2019-07-31 DIAGNOSIS — E876 Hypokalemia: Secondary | ICD-10-CM | POA: Diagnosis not present

## 2019-07-31 DIAGNOSIS — N2581 Secondary hyperparathyroidism of renal origin: Secondary | ICD-10-CM | POA: Diagnosis not present

## 2019-08-03 DIAGNOSIS — E876 Hypokalemia: Secondary | ICD-10-CM | POA: Diagnosis not present

## 2019-08-03 DIAGNOSIS — D631 Anemia in chronic kidney disease: Secondary | ICD-10-CM | POA: Diagnosis not present

## 2019-08-03 DIAGNOSIS — N2581 Secondary hyperparathyroidism of renal origin: Secondary | ICD-10-CM | POA: Diagnosis not present

## 2019-08-03 DIAGNOSIS — Z23 Encounter for immunization: Secondary | ICD-10-CM | POA: Diagnosis not present

## 2019-08-03 DIAGNOSIS — N186 End stage renal disease: Secondary | ICD-10-CM | POA: Diagnosis not present

## 2019-08-03 DIAGNOSIS — Z992 Dependence on renal dialysis: Secondary | ICD-10-CM | POA: Diagnosis not present

## 2019-08-05 DIAGNOSIS — Z23 Encounter for immunization: Secondary | ICD-10-CM | POA: Diagnosis not present

## 2019-08-05 DIAGNOSIS — E876 Hypokalemia: Secondary | ICD-10-CM | POA: Diagnosis not present

## 2019-08-05 DIAGNOSIS — N2581 Secondary hyperparathyroidism of renal origin: Secondary | ICD-10-CM | POA: Diagnosis not present

## 2019-08-05 DIAGNOSIS — Z992 Dependence on renal dialysis: Secondary | ICD-10-CM | POA: Diagnosis not present

## 2019-08-05 DIAGNOSIS — D631 Anemia in chronic kidney disease: Secondary | ICD-10-CM | POA: Diagnosis not present

## 2019-08-05 DIAGNOSIS — N186 End stage renal disease: Secondary | ICD-10-CM | POA: Diagnosis not present

## 2019-08-07 DIAGNOSIS — Z23 Encounter for immunization: Secondary | ICD-10-CM | POA: Diagnosis not present

## 2019-08-07 DIAGNOSIS — N2581 Secondary hyperparathyroidism of renal origin: Secondary | ICD-10-CM | POA: Diagnosis not present

## 2019-08-07 DIAGNOSIS — N186 End stage renal disease: Secondary | ICD-10-CM | POA: Diagnosis not present

## 2019-08-07 DIAGNOSIS — D631 Anemia in chronic kidney disease: Secondary | ICD-10-CM | POA: Diagnosis not present

## 2019-08-07 DIAGNOSIS — E876 Hypokalemia: Secondary | ICD-10-CM | POA: Diagnosis not present

## 2019-08-07 DIAGNOSIS — Z992 Dependence on renal dialysis: Secondary | ICD-10-CM | POA: Diagnosis not present

## 2019-08-10 DIAGNOSIS — N2581 Secondary hyperparathyroidism of renal origin: Secondary | ICD-10-CM | POA: Diagnosis not present

## 2019-08-10 DIAGNOSIS — Z23 Encounter for immunization: Secondary | ICD-10-CM | POA: Diagnosis not present

## 2019-08-10 DIAGNOSIS — N186 End stage renal disease: Secondary | ICD-10-CM | POA: Diagnosis not present

## 2019-08-10 DIAGNOSIS — Z992 Dependence on renal dialysis: Secondary | ICD-10-CM | POA: Diagnosis not present

## 2019-08-10 DIAGNOSIS — D631 Anemia in chronic kidney disease: Secondary | ICD-10-CM | POA: Diagnosis not present

## 2019-08-10 DIAGNOSIS — E876 Hypokalemia: Secondary | ICD-10-CM | POA: Diagnosis not present

## 2019-08-11 DIAGNOSIS — Z87891 Personal history of nicotine dependence: Secondary | ICD-10-CM | POA: Diagnosis not present

## 2019-08-11 DIAGNOSIS — Z0000 Encounter for general adult medical examination without abnormal findings: Secondary | ICD-10-CM | POA: Diagnosis not present

## 2019-08-11 DIAGNOSIS — Z13220 Encounter for screening for lipoid disorders: Secondary | ICD-10-CM | POA: Diagnosis not present

## 2019-08-11 DIAGNOSIS — F5101 Primary insomnia: Secondary | ICD-10-CM | POA: Diagnosis not present

## 2019-08-11 DIAGNOSIS — K4090 Unilateral inguinal hernia, without obstruction or gangrene, not specified as recurrent: Secondary | ICD-10-CM | POA: Diagnosis not present

## 2019-08-11 DIAGNOSIS — Z125 Encounter for screening for malignant neoplasm of prostate: Secondary | ICD-10-CM | POA: Diagnosis not present

## 2019-08-12 DIAGNOSIS — Z992 Dependence on renal dialysis: Secondary | ICD-10-CM | POA: Diagnosis not present

## 2019-08-12 DIAGNOSIS — N186 End stage renal disease: Secondary | ICD-10-CM | POA: Diagnosis not present

## 2019-08-12 DIAGNOSIS — E876 Hypokalemia: Secondary | ICD-10-CM | POA: Diagnosis not present

## 2019-08-12 DIAGNOSIS — Z23 Encounter for immunization: Secondary | ICD-10-CM | POA: Diagnosis not present

## 2019-08-12 DIAGNOSIS — N2581 Secondary hyperparathyroidism of renal origin: Secondary | ICD-10-CM | POA: Diagnosis not present

## 2019-08-12 DIAGNOSIS — D631 Anemia in chronic kidney disease: Secondary | ICD-10-CM | POA: Diagnosis not present

## 2019-08-14 DIAGNOSIS — N2581 Secondary hyperparathyroidism of renal origin: Secondary | ICD-10-CM | POA: Diagnosis not present

## 2019-08-14 DIAGNOSIS — D631 Anemia in chronic kidney disease: Secondary | ICD-10-CM | POA: Diagnosis not present

## 2019-08-14 DIAGNOSIS — Z992 Dependence on renal dialysis: Secondary | ICD-10-CM | POA: Diagnosis not present

## 2019-08-14 DIAGNOSIS — Z23 Encounter for immunization: Secondary | ICD-10-CM | POA: Diagnosis not present

## 2019-08-14 DIAGNOSIS — N186 End stage renal disease: Secondary | ICD-10-CM | POA: Diagnosis not present

## 2019-08-14 DIAGNOSIS — E876 Hypokalemia: Secondary | ICD-10-CM | POA: Diagnosis not present

## 2019-08-17 DIAGNOSIS — Z23 Encounter for immunization: Secondary | ICD-10-CM | POA: Diagnosis not present

## 2019-08-17 DIAGNOSIS — D631 Anemia in chronic kidney disease: Secondary | ICD-10-CM | POA: Diagnosis not present

## 2019-08-17 DIAGNOSIS — N186 End stage renal disease: Secondary | ICD-10-CM | POA: Diagnosis not present

## 2019-08-17 DIAGNOSIS — N2581 Secondary hyperparathyroidism of renal origin: Secondary | ICD-10-CM | POA: Diagnosis not present

## 2019-08-17 DIAGNOSIS — Z992 Dependence on renal dialysis: Secondary | ICD-10-CM | POA: Diagnosis not present

## 2019-08-17 DIAGNOSIS — E876 Hypokalemia: Secondary | ICD-10-CM | POA: Diagnosis not present

## 2019-08-19 DIAGNOSIS — N186 End stage renal disease: Secondary | ICD-10-CM | POA: Diagnosis not present

## 2019-08-19 DIAGNOSIS — Z23 Encounter for immunization: Secondary | ICD-10-CM | POA: Diagnosis not present

## 2019-08-19 DIAGNOSIS — Z992 Dependence on renal dialysis: Secondary | ICD-10-CM | POA: Diagnosis not present

## 2019-08-19 DIAGNOSIS — N2581 Secondary hyperparathyroidism of renal origin: Secondary | ICD-10-CM | POA: Diagnosis not present

## 2019-08-19 DIAGNOSIS — D631 Anemia in chronic kidney disease: Secondary | ICD-10-CM | POA: Diagnosis not present

## 2019-08-19 DIAGNOSIS — E876 Hypokalemia: Secondary | ICD-10-CM | POA: Diagnosis not present

## 2019-08-21 DIAGNOSIS — Z23 Encounter for immunization: Secondary | ICD-10-CM | POA: Diagnosis not present

## 2019-08-21 DIAGNOSIS — N2581 Secondary hyperparathyroidism of renal origin: Secondary | ICD-10-CM | POA: Diagnosis not present

## 2019-08-21 DIAGNOSIS — N186 End stage renal disease: Secondary | ICD-10-CM | POA: Diagnosis not present

## 2019-08-21 DIAGNOSIS — D631 Anemia in chronic kidney disease: Secondary | ICD-10-CM | POA: Diagnosis not present

## 2019-08-21 DIAGNOSIS — E876 Hypokalemia: Secondary | ICD-10-CM | POA: Diagnosis not present

## 2019-08-21 DIAGNOSIS — Z992 Dependence on renal dialysis: Secondary | ICD-10-CM | POA: Diagnosis not present

## 2019-08-23 DIAGNOSIS — Z992 Dependence on renal dialysis: Secondary | ICD-10-CM | POA: Diagnosis not present

## 2019-08-23 DIAGNOSIS — I129 Hypertensive chronic kidney disease with stage 1 through stage 4 chronic kidney disease, or unspecified chronic kidney disease: Secondary | ICD-10-CM | POA: Diagnosis not present

## 2019-08-23 DIAGNOSIS — N186 End stage renal disease: Secondary | ICD-10-CM | POA: Diagnosis not present

## 2019-08-24 ENCOUNTER — Other Ambulatory Visit: Payer: Self-pay

## 2019-08-24 ENCOUNTER — Inpatient Hospital Stay (HOSPITAL_COMMUNITY)
Admission: EM | Admit: 2019-08-24 | Discharge: 2019-09-01 | DRG: 871 | Disposition: A | Discharge status: Home / Self Care | Payer: Medicare Other | Attending: Internal Medicine | Admitting: Internal Medicine

## 2019-08-24 ENCOUNTER — Emergency Department (HOSPITAL_COMMUNITY): Payer: Medicare Other

## 2019-08-24 ENCOUNTER — Encounter (HOSPITAL_COMMUNITY): Payer: Self-pay | Admitting: Emergency Medicine

## 2019-08-24 DIAGNOSIS — A409 Streptococcal sepsis, unspecified: Secondary | ICD-10-CM | POA: Diagnosis not present

## 2019-08-24 DIAGNOSIS — Z20828 Contact with and (suspected) exposure to other viral communicable diseases: Secondary | ICD-10-CM | POA: Diagnosis not present

## 2019-08-24 DIAGNOSIS — Z23 Encounter for immunization: Secondary | ICD-10-CM | POA: Diagnosis not present

## 2019-08-24 DIAGNOSIS — D649 Anemia, unspecified: Secondary | ICD-10-CM | POA: Diagnosis present

## 2019-08-24 DIAGNOSIS — Z7982 Long term (current) use of aspirin: Secondary | ICD-10-CM

## 2019-08-24 DIAGNOSIS — I499 Cardiac arrhythmia, unspecified: Secondary | ICD-10-CM | POA: Diagnosis not present

## 2019-08-24 DIAGNOSIS — Z801 Family history of malignant neoplasm of trachea, bronchus and lung: Secondary | ICD-10-CM

## 2019-08-24 DIAGNOSIS — R6510 Systemic inflammatory response syndrome (SIRS) of non-infectious origin without acute organ dysfunction: Secondary | ICD-10-CM | POA: Diagnosis present

## 2019-08-24 DIAGNOSIS — E876 Hypokalemia: Secondary | ICD-10-CM | POA: Diagnosis not present

## 2019-08-24 DIAGNOSIS — D631 Anemia in chronic kidney disease: Secondary | ICD-10-CM | POA: Diagnosis not present

## 2019-08-24 DIAGNOSIS — I10 Essential (primary) hypertension: Secondary | ICD-10-CM | POA: Diagnosis not present

## 2019-08-24 DIAGNOSIS — Z992 Dependence on renal dialysis: Secondary | ICD-10-CM | POA: Diagnosis not present

## 2019-08-24 DIAGNOSIS — Z79891 Long term (current) use of opiate analgesic: Secondary | ICD-10-CM

## 2019-08-24 DIAGNOSIS — R509 Fever, unspecified: Secondary | ICD-10-CM | POA: Diagnosis not present

## 2019-08-24 DIAGNOSIS — N2581 Secondary hyperparathyroidism of renal origin: Secondary | ICD-10-CM | POA: Diagnosis present

## 2019-08-24 DIAGNOSIS — R000 Tachycardia, unspecified: Secondary | ICD-10-CM | POA: Diagnosis not present

## 2019-08-24 DIAGNOSIS — E785 Hyperlipidemia, unspecified: Secondary | ICD-10-CM | POA: Diagnosis present

## 2019-08-24 DIAGNOSIS — I120 Hypertensive chronic kidney disease with stage 5 chronic kidney disease or end stage renal disease: Secondary | ICD-10-CM | POA: Diagnosis present

## 2019-08-24 DIAGNOSIS — Z79899 Other long term (current) drug therapy: Secondary | ICD-10-CM

## 2019-08-24 DIAGNOSIS — N186 End stage renal disease: Secondary | ICD-10-CM | POA: Diagnosis not present

## 2019-08-24 DIAGNOSIS — Z209 Contact with and (suspected) exposure to unspecified communicable disease: Secondary | ICD-10-CM | POA: Diagnosis not present

## 2019-08-24 DIAGNOSIS — R7881 Bacteremia: Secondary | ICD-10-CM

## 2019-08-24 DIAGNOSIS — B9689 Other specified bacterial agents as the cause of diseases classified elsewhere: Secondary | ICD-10-CM

## 2019-08-24 DIAGNOSIS — K029 Dental caries, unspecified: Secondary | ICD-10-CM | POA: Diagnosis present

## 2019-08-24 DIAGNOSIS — E8889 Other specified metabolic disorders: Secondary | ICD-10-CM | POA: Diagnosis present

## 2019-08-24 DIAGNOSIS — Z87891 Personal history of nicotine dependence: Secondary | ICD-10-CM

## 2019-08-24 DIAGNOSIS — R42 Dizziness and giddiness: Secondary | ICD-10-CM | POA: Diagnosis not present

## 2019-08-24 DIAGNOSIS — F419 Anxiety disorder, unspecified: Secondary | ICD-10-CM | POA: Diagnosis present

## 2019-08-24 DIAGNOSIS — Z8249 Family history of ischemic heart disease and other diseases of the circulatory system: Secondary | ICD-10-CM

## 2019-08-24 LAB — CBC WITH DIFFERENTIAL/PLATELET
Abs Immature Granulocytes: 0.03 10*3/uL (ref 0.00–0.07)
Basophils Absolute: 0 10*3/uL (ref 0.0–0.1)
Basophils Relative: 0 %
Eosinophils Absolute: 0 10*3/uL (ref 0.0–0.5)
Eosinophils Relative: 0 %
HCT: 32.8 % — ABNORMAL LOW (ref 39.0–52.0)
Hemoglobin: 10.8 g/dL — ABNORMAL LOW (ref 13.0–17.0)
Immature Granulocytes: 0 %
Lymphocytes Relative: 6 %
Lymphs Abs: 0.4 10*3/uL — ABNORMAL LOW (ref 0.7–4.0)
MCH: 29.5 pg (ref 26.0–34.0)
MCHC: 32.9 g/dL (ref 30.0–36.0)
MCV: 89.6 fL (ref 80.0–100.0)
Monocytes Absolute: 0.5 10*3/uL (ref 0.1–1.0)
Monocytes Relative: 6 %
Neutro Abs: 6.3 10*3/uL (ref 1.7–7.7)
Neutrophils Relative %: 88 %
Platelets: 181 10*3/uL (ref 150–400)
RBC: 3.66 MIL/uL — ABNORMAL LOW (ref 4.22–5.81)
RDW: 16.9 % — ABNORMAL HIGH (ref 11.5–15.5)
WBC: 7.3 10*3/uL (ref 4.0–10.5)
nRBC: 0 % (ref 0.0–0.2)

## 2019-08-24 LAB — COMPREHENSIVE METABOLIC PANEL
ALT: 17 U/L (ref 0–44)
AST: 16 U/L (ref 15–41)
Albumin: 4.2 g/dL (ref 3.5–5.0)
Alkaline Phosphatase: 54 U/L (ref 38–126)
Anion gap: 12 (ref 5–15)
BUN: 16 mg/dL (ref 8–23)
CO2: 29 mmol/L (ref 22–32)
Calcium: 9.2 mg/dL (ref 8.9–10.3)
Chloride: 96 mmol/L — ABNORMAL LOW (ref 98–111)
Creatinine, Ser: 6.12 mg/dL — ABNORMAL HIGH (ref 0.61–1.24)
GFR calc Af Amer: 10 mL/min — ABNORMAL LOW (ref >60)
GFR calc non Af Amer: 9 mL/min — ABNORMAL LOW (ref >60)
Glucose, Bld: 122 mg/dL — ABNORMAL HIGH (ref 70–99)
Potassium: 3.8 mmol/L (ref 3.5–5.1)
Sodium: 137 mmol/L (ref 135–145)
Total Bilirubin: 0.5 mg/dL (ref 0.3–1.2)
Total Protein: 7.5 g/dL (ref 6.5–8.1)

## 2019-08-24 LAB — LACTIC ACID, PLASMA
Lactic Acid, Venous: 1.3 mmol/L (ref 0.5–1.9)
Lactic Acid, Venous: 1.7 mmol/L (ref 0.5–1.9)

## 2019-08-24 LAB — SARS CORONAVIRUS 2 BY RT PCR (HOSPITAL ORDER, PERFORMED IN ~~LOC~~ HOSPITAL LAB): SARS Coronavirus 2: NEGATIVE

## 2019-08-24 MED ORDER — PIPERACILLIN-TAZOBACTAM 3.375 G IVPB 30 MIN
3.3750 g | Freq: Once | INTRAVENOUS | Status: AC
  Administered 2019-08-25: 01:00:00 3.375 g via INTRAVENOUS
  Filled 2019-08-24: qty 50

## 2019-08-24 MED ORDER — IBUPROFEN 800 MG PO TABS: 800.0000 mg | ORAL_TABLET | Freq: Once | ORAL | Status: DC

## 2019-08-24 MED ORDER — HYDRALAZINE HCL 25 MG PO TABS
25.0000 mg | ORAL_TABLET | Freq: Once | ORAL | Status: AC
  Administered 2019-08-24: 23:00:00 25 mg via ORAL
  Filled 2019-08-24: qty 1

## 2019-08-24 MED ORDER — VANCOMYCIN HCL 10 G IV SOLR
1250.0000 mg | Freq: Once | INTRAVENOUS | Status: AC
  Administered 2019-08-25: 1250 mg via INTRAVENOUS
  Filled 2019-08-24: qty 1250

## 2019-08-24 MED ORDER — ACETAMINOPHEN 325 MG PO TABS
650.0000 mg | ORAL_TABLET | Freq: Once | ORAL | Status: AC
  Administered 2019-08-24: 23:00:00 650 mg via ORAL
  Filled 2019-08-24: qty 2

## 2019-08-24 MED ORDER — PIPERACILLIN-TAZOBACTAM 3.375 G IVPB 30 MIN: 4.5000 g | Freq: Once | INTRAVENOUS | Status: DC

## 2019-08-24 NOTE — ED Triage Notes (Addendum)
Pt presents to ED from home BIB [facility]. Pt c/o dizziness and racing heart beginning around 1800. Pt reports going to dialysis today and having a normal dialysis w/ no fever reported there. EMS given 1000 mg of tylenol around 1915. EMS reports temp 102 orally.

## 2019-08-24 NOTE — ED Provider Notes (Signed)
[facility] EMERGENCY DEPARTMENT Provider Note   CSN: [id number] Arrival date & time: 08/24/19  1958     History   Chief Complaint Chief Complaint  Patient presents with  . Fever    HPI Jesus Ayala is a 68 y.o. male.     Patient is a 68 year old patient with end-stage renal disease on dialysis, anxiety, anemia, hypertension presenting to the emergency department for fever.  Patient reports that he was in his usual state of health and completed his dialysis treatment today.  Reports he went home and laid down and felt dizzy.  Reports that he checked his temperature and it was elevated.  Reports he then called 911.  He was given a gram of Tylenol around 715 by EMS.  He currently has no specific complaints.  Reports that he is feeling some chills but otherwise no dysuria, hematuria, nausea, vomiting, chest pain, shortness of breath, cough, congestion or URI symptoms.  He denies any sick contacts.     Past Medical History:  Diagnosis Date  . Anemia   . Anxiety    situational  . ESRD (end stage renal disease) ([place])    MWF- [facility] [place]  . HLD (hyperlipidemia)   . Hypertension     Patient Active Problem List   Diagnosis Date Noted  . Acute blood loss anemia 06/25/2019  . ESRD (end stage renal disease) ([place]) 06/25/2019  . Complication of dialysis access insertion 06/25/2019  . Acute renal failure superimposed on stage 3 chronic kidney disease ([place]) 04/19/2019  . Chest pain 04/19/2019  . Hypokalemia 04/19/2019  . Normocytic anemia 04/19/2019  . HLD (hyperlipidemia)   . Hypertension     Past Surgical History:  Procedure Laterality Date  . AV FISTULA PLACEMENT Left 07/14/2019   Procedure: INSERTION OF ARTERIOVENOUS (AV) GORE-TEX GRAFT ARM;  Surgeon: Angelia Mould, MD;  Location: [facility];  Service: Vascular;  Laterality: Left;  . COLONOSCOPY    . HERNIA REPAIR     unbicial  . INSERTION OF DIALYSIS CATHETER Right 06/25/2019   Procedure:  INSERTION OF TUNNEL DIALYSIS CATHETER;  Surgeon: Waynetta Sandy, MD;  Location: [facility];  Service: Vascular;  Laterality: Right;        Home Medications    Prior to Admission medications   Medication Sig Start Date End Date Taking? Authorizing Provider  acetaminophen (TYLENOL) 500 MG tablet Take 500-1,000 mg by mouth every 6 (six) hours as needed (pain).    [provider]  amLODipine (NORVASC) 10 MG tablet Take 10 mg by mouth daily. 04/12/19   [provider]  aspirin 81 MG chewable tablet Chew 81 mg by mouth daily.    [provider]  B Complex-C-Zn-Folic Acid (DIALYVITE AB-123456789 15 PO) Take 1 tablet by mouth daily at 3 pm.    [provider]  calcitRIOL (ROCALTROL) 0.25 MCG capsule Take 0.25 mcg by mouth daily. 06/12/19   [provider]  calcium acetate (PHOSLO) 667 MG capsule Take 1 capsule (667 mg total) by mouth 3 (three) times daily with meals. Patient taking differently: Take 1,334 mg by mouth 3 (three) times daily with meals.  04/22/19   Lavina Hamman, MD  furosemide (LASIX) 80 MG tablet Take 80 mg by mouth daily. 01/22/19   [provider]  hydrALAZINE (APRESOLINE) 25 MG tablet Take 1 tablet (25 mg total) by mouth 3 (three) times daily. 04/22/19   Lavina Hamman, MD  hydrocortisone (ANUSOL-HC) 2.5 % rectal cream Place 1  application rectally 2 (two) times daily. 07/24/19   Deno Etienne, DO  hydrocortisone (ANUSOL-HC) 25 MG suppository Place 1 suppository (25 mg total) rectally 2 (two) times daily. For 7 days 07/24/19   Deno Etienne, DO  latanoprost (XALATAN) 0.005 % ophthalmic solution Place 1 drop into both eyes at bedtime.    [provider]  metoprolol tartrate (LOPRESSOR) 100 MG tablet Take 100 mg by mouth 2 (two) times a day. 04/12/19   [provider]  nitroGLYCERIN (NITROSTAT) 0.4 MG SL tablet Place 0.4 mg under the tongue every 5 (five) minutes as needed for chest pain.  03/27/19   [provider]   oxyCODONE-acetaminophen (PERCOCET) 5-325 MG tablet Take 1 tablet by mouth every 6 (six) hours as needed. 07/21/19   Rhyne, Hulen Shouts, PA-C  traZODone (DESYREL) 50 MG tablet Take 50 mg by mouth at bedtime.    [provider]    Family History Family History  Problem Relation Age of Onset  . Lung cancer Father   . Hypertension Brother     Social History Social History   Tobacco Use  . Smoking status: Former Smoker    Years: 2.00    Quit date: 1987    Years since quitting: 33.8  . Smokeless tobacco: Never Used  Substance Use Topics  . Alcohol use: Not Currently  . Drug use: No     Allergies   Ace inhibitors, Enalapril, and Iron sucrose   Review of Systems Review of Systems  Constitutional: Positive for chills and fever. Negative for activity change, appetite change and fatigue.  HENT: Negative for congestion, rhinorrhea, sinus pain, sore throat and trouble swallowing.   Respiratory: Negative for cough and shortness of breath.   Cardiovascular: Negative for chest pain.  Gastrointestinal: Negative for abdominal pain, diarrhea, nausea and vomiting.  Endocrine: Negative for polyuria.  Genitourinary: Negative for dysuria.  Musculoskeletal: Negative for arthralgias, back pain and myalgias.  Skin: Negative for rash.  Neurological: Positive for dizziness. Negative for light-headedness and headaches.  Psychiatric/Behavioral: Negative for confusion.     Physical Exam Updated Vital Signs BP (!) 164/71   Pulse (!) 113   Temp (!) 102.5 F (39.2 C) (Oral)   Resp 20   Ht 5\' 11"  (1.803 m)   Wt 73 kg   SpO2 98%   BMI 22.45 kg/m   Physical Exam Vitals signs and nursing note reviewed.  Constitutional:      Appearance: Normal appearance.  HENT:     Head: Normocephalic.     Nose: Nose normal.     Mouth/Throat:     Mouth: Mucous membranes are moist.  Eyes:     Conjunctiva/sclera: Conjunctivae normal.  Cardiovascular:     Rate and Rhythm: Tachycardia present.      Pulses: Normal pulses.  Pulmonary:     Effort: Pulmonary effort is normal.     Breath sounds: Normal breath sounds.  Abdominal:     General: Abdomen is flat. Bowel sounds are normal. There is no distension.     Tenderness: There is no abdominal tenderness.  Skin:    General: Skin is dry.  Neurological:     General: No focal deficit present.     Mental Status: He is alert.  Psychiatric:        Mood and Affect: Mood normal.      ED Treatments / Results  Labs (all labs ordered are listed, but only abnormal results are displayed) Labs Reviewed  COMPREHENSIVE METABOLIC PANEL - Abnormal; Notable  for the following components:      Result Value   Chloride 96 (*)    Glucose, Bld 122 (*)    Creatinine, Ser 6.12 (*)    GFR calc non Af Amer 9 (*)    GFR calc Af Amer 10 (*)    All other components within normal limits  CBC WITH DIFFERENTIAL/PLATELET - Abnormal; Notable for the following components:   RBC 3.66 (*)    Hemoglobin 10.8 (*)    HCT 32.8 (*)    RDW 16.9 (*)    Lymphs Abs 0.4 (*)    All other components within normal limits  SARS CORONAVIRUS 2 BY RT PCR (HOSPITAL ORDER, [facility])  CULTURE, BLOOD (ROUTINE X 2)  CULTURE, BLOOD (ROUTINE X 2)  LACTIC ACID, PLASMA  LACTIC ACID, PLASMA  URINALYSIS, ROUTINE W REFLEX MICROSCOPIC    EKG None  Radiology Dg Chest Portable 1 View  Result Date: 08/24/2019 CLINICAL DATA:  Fever EXAM: PORTABLE CHEST 1 VIEW COMPARISON:  06/25/2019 FINDINGS: Right-sided central venous port tip over the SVC. No focal opacity or pleural effusion. Normal heart size. No pneumothorax IMPRESSION: No active disease. Electronically Signed   By: Donavan Foil M.D.   On: 08/24/2019 20:32    Procedures Procedures (including critical care time)  Medications Ordered in ED Medications  vancomycin (VANCOCIN) 1,250 mg in sodium chloride 0.9 % 250 mL IVPB (has no administration in time range)  piperacillin-tazobactam (ZOSYN) IVPB  3.375 g (has no administration in time range)  hydrALAZINE (APRESOLINE) tablet 25 mg (25 mg Oral Given 08/24/19 2233)  acetaminophen (TYLENOL) tablet 650 mg (650 mg Oral Given 08/24/19 2235)     Initial Impression / Assessment and Plan / ED Course  I have reviewed the triage vital signs and the nursing notes.  Pertinent labs & imaging results that were available during my care of the patient were reviewed by me and considered in my medical decision making (see chart for details).  Clinical Course as of Aug 25 27  Tue Aug 25, 2019  0001 Waiting on consult for admission for this patient. Elderly dialysis patient with persistent fever after dialysis today despite two doses of tylenol. -covid, -chest xray. Patient still makes urine but has not given a sample yet. Temp 102.5 and tachy to 110s. Discussed with Dr. Darl Householder   [KM]  [phone] Spoke with Dr. Patrecia Pour for admission for this patient.   [KM]    Clinical Course User Index [KM] Alveria Apley, PA-C       The patient appears reasonably stabilized for admission considering the current resources, flow, and capabilities available in the ED at this time, and I doubt any other [place] requiring further screening and/or treatment in the ED prior to admission.    Final Clinical Impressions(s) / ED Diagnoses   Final diagnoses:  Febrile illness, acute    ED Discharge Orders    None       Kristine Royal 08/25/19 0028    Drenda Freeze, MD 08/25/19 [phone]

## 2019-08-25 DIAGNOSIS — R6510 Systemic inflammatory response syndrome (SIRS) of non-infectious origin without acute organ dysfunction: Secondary | ICD-10-CM | POA: Diagnosis not present

## 2019-08-25 DIAGNOSIS — Z87891 Personal history of nicotine dependence: Secondary | ICD-10-CM | POA: Diagnosis not present

## 2019-08-25 DIAGNOSIS — R509 Fever, unspecified: Secondary | ICD-10-CM | POA: Diagnosis not present

## 2019-08-25 DIAGNOSIS — N2581 Secondary hyperparathyroidism of renal origin: Secondary | ICD-10-CM | POA: Diagnosis present

## 2019-08-25 DIAGNOSIS — D649 Anemia, unspecified: Secondary | ICD-10-CM

## 2019-08-25 DIAGNOSIS — R42 Dizziness and giddiness: Secondary | ICD-10-CM | POA: Diagnosis not present

## 2019-08-25 DIAGNOSIS — B954 Other streptococcus as the cause of diseases classified elsewhere: Secondary | ICD-10-CM | POA: Diagnosis not present

## 2019-08-25 DIAGNOSIS — E785 Hyperlipidemia, unspecified: Secondary | ICD-10-CM | POA: Diagnosis present

## 2019-08-25 DIAGNOSIS — Z79891 Long term (current) use of opiate analgesic: Secondary | ICD-10-CM | POA: Diagnosis not present

## 2019-08-25 DIAGNOSIS — B9689 Other specified bacterial agents as the cause of diseases classified elsewhere: Secondary | ICD-10-CM | POA: Diagnosis not present

## 2019-08-25 DIAGNOSIS — Z7982 Long term (current) use of aspirin: Secondary | ICD-10-CM | POA: Diagnosis not present

## 2019-08-25 DIAGNOSIS — N186 End stage renal disease: Secondary | ICD-10-CM | POA: Diagnosis present

## 2019-08-25 DIAGNOSIS — Z992 Dependence on renal dialysis: Secondary | ICD-10-CM | POA: Diagnosis not present

## 2019-08-25 DIAGNOSIS — I10 Essential (primary) hypertension: Secondary | ICD-10-CM | POA: Diagnosis not present

## 2019-08-25 DIAGNOSIS — E8889 Other specified metabolic disorders: Secondary | ICD-10-CM | POA: Diagnosis present

## 2019-08-25 DIAGNOSIS — R000 Tachycardia, unspecified: Secondary | ICD-10-CM | POA: Diagnosis not present

## 2019-08-25 DIAGNOSIS — A409 Streptococcal sepsis, unspecified: Secondary | ICD-10-CM | POA: Diagnosis present

## 2019-08-25 DIAGNOSIS — Z8249 Family history of ischemic heart disease and other diseases of the circulatory system: Secondary | ICD-10-CM | POA: Diagnosis not present

## 2019-08-25 DIAGNOSIS — D631 Anemia in chronic kidney disease: Secondary | ICD-10-CM | POA: Diagnosis present

## 2019-08-25 DIAGNOSIS — Z20828 Contact with and (suspected) exposure to other viral communicable diseases: Secondary | ICD-10-CM | POA: Diagnosis present

## 2019-08-25 DIAGNOSIS — Z888 Allergy status to other drugs, medicaments and biological substances status: Secondary | ICD-10-CM | POA: Diagnosis not present

## 2019-08-25 DIAGNOSIS — S025XXA Fracture of tooth (traumatic), initial encounter for closed fracture: Secondary | ICD-10-CM | POA: Diagnosis not present

## 2019-08-25 DIAGNOSIS — K029 Dental caries, unspecified: Secondary | ICD-10-CM | POA: Diagnosis not present

## 2019-08-25 DIAGNOSIS — B951 Streptococcus, group B, as the cause of diseases classified elsewhere: Secondary | ICD-10-CM | POA: Diagnosis not present

## 2019-08-25 DIAGNOSIS — K089 Disorder of teeth and supporting structures, unspecified: Secondary | ICD-10-CM | POA: Diagnosis not present

## 2019-08-25 DIAGNOSIS — I120 Hypertensive chronic kidney disease with stage 5 chronic kidney disease or end stage renal disease: Secondary | ICD-10-CM | POA: Diagnosis present

## 2019-08-25 DIAGNOSIS — Z79899 Other long term (current) drug therapy: Secondary | ICD-10-CM | POA: Diagnosis not present

## 2019-08-25 DIAGNOSIS — Z801 Family history of malignant neoplasm of trachea, bronchus and lung: Secondary | ICD-10-CM | POA: Diagnosis not present

## 2019-08-25 DIAGNOSIS — R7881 Bacteremia: Secondary | ICD-10-CM | POA: Diagnosis not present

## 2019-08-25 DIAGNOSIS — I371 Nonrheumatic pulmonary valve insufficiency: Secondary | ICD-10-CM | POA: Diagnosis not present

## 2019-08-25 DIAGNOSIS — F419 Anxiety disorder, unspecified: Secondary | ICD-10-CM | POA: Diagnosis present

## 2019-08-25 LAB — FERRITIN: Ferritin: 375 ng/mL — ABNORMAL HIGH (ref 24–336)

## 2019-08-25 LAB — BLOOD CULTURE ID PANEL (REFLEXED)

## 2019-08-25 LAB — RESPIRATORY PANEL BY PCR

## 2019-08-25 LAB — INFLUENZA PANEL BY PCR (TYPE A & B)
Influenza A By PCR: NEGATIVE
Influenza B By PCR: NEGATIVE

## 2019-08-25 LAB — CBC
HCT: 31.3 % — ABNORMAL LOW (ref 39.0–52.0)
Hemoglobin: 10.2 g/dL — ABNORMAL LOW (ref 13.0–17.0)
MCH: 29.5 pg (ref 26.0–34.0)
MCHC: 32.6 g/dL (ref 30.0–36.0)
MCV: 90.5 fL (ref 80.0–100.0)
Platelets: 163 10*3/uL (ref 150–400)
RBC: 3.46 MIL/uL — ABNORMAL LOW (ref 4.22–5.81)
RDW: 17.1 % — ABNORMAL HIGH (ref 11.5–15.5)
WBC: 8.9 10*3/uL (ref 4.0–10.5)
nRBC: 0 % (ref 0.0–0.2)

## 2019-08-25 LAB — IRON AND TIBC
Iron: 11 ug/dL — ABNORMAL LOW (ref 45–182)
Saturation Ratios: 5 % — ABNORMAL LOW (ref 17.9–39.5)
TIBC: 211 ug/dL — ABNORMAL LOW (ref 250–450)
UIBC: 200 ug/dL

## 2019-08-25 LAB — URINALYSIS, ROUTINE W REFLEX MICROSCOPIC
Bacteria, UA: NONE SEEN
Bilirubin Urine: NEGATIVE
Glucose, UA: NEGATIVE mg/dL
Hgb urine dipstick: NEGATIVE
Ketones, ur: NEGATIVE mg/dL
Leukocytes,Ua: NEGATIVE
Nitrite: NEGATIVE
Protein, ur: >=300 mg/dL — AB
Specific Gravity, Urine: 1.011 (ref 1.005–1.030)
pH: 8 (ref 5.0–8.0)

## 2019-08-25 LAB — LACTIC ACID, PLASMA
Lactic Acid, Venous: 1.5 mmol/L (ref 0.5–1.9)
Lactic Acid, Venous: 0.7 mmol/L (ref 0.5–1.9)

## 2019-08-25 LAB — PROCALCITONIN
Procalcitonin: 4.03 ng/mL
Procalcitonin: 4.86 ng/mL

## 2019-08-25 LAB — BASIC METABOLIC PANEL
Anion gap: 12 (ref 5–15)
BUN: 21 mg/dL (ref 8–23)
CO2: 27 mmol/L (ref 22–32)
Calcium: 8.7 mg/dL — ABNORMAL LOW (ref 8.9–10.3)
Chloride: 98 mmol/L (ref 98–111)
Creatinine, Ser: 7.48 mg/dL — ABNORMAL HIGH (ref 0.61–1.24)
GFR calc Af Amer: 8 mL/min — ABNORMAL LOW (ref >60)
GFR calc non Af Amer: 7 mL/min — ABNORMAL LOW (ref >60)
Glucose, Bld: 93 mg/dL (ref 70–99)
Potassium: 4 mmol/L (ref 3.5–5.1)
Sodium: 137 mmol/L (ref 135–145)

## 2019-08-25 LAB — RETICULOCYTES
Immature Retic Fract: 14.1 % (ref 2.3–15.9)
RBC.: 3.53 MIL/uL — ABNORMAL LOW (ref 4.22–5.81)
Retic Count, Absolute: 78.4 10*3/uL (ref 19.0–186.0)
Retic Ct Pct: 2.2 % (ref 0.4–3.1)

## 2019-08-25 LAB — PROTIME-INR
INR: 1.1 (ref 0.8–1.2)
Prothrombin Time: 14.5 seconds (ref 11.4–15.2)

## 2019-08-25 LAB — APTT: aPTT: 38 seconds — ABNORMAL HIGH (ref 24–36)

## 2019-08-25 LAB — FIBRINOGEN: Fibrinogen: 445 mg/dL (ref 210–475)

## 2019-08-25 LAB — VITAMIN B12: Vitamin B-12: 378 pg/mL (ref 180–914)

## 2019-08-25 LAB — LACTATE DEHYDROGENASE: LDH: 174 U/L (ref 98–192)

## 2019-08-25 LAB — FOLATE: Folate: 22.9 ng/mL (ref >5.9)

## 2019-08-25 LAB — MRSA PCR SCREENING: MRSA by PCR: NEGATIVE

## 2019-08-25 MED ORDER — CALCITRIOL 0.25 MCG PO CAPS
0.2500 ug | ORAL_CAPSULE | Freq: Every day | ORAL | Status: DC
  Administered 2019-08-25 – 2019-09-01 (×8): 0.25 ug via ORAL
  Filled 2019-08-25 (×8): qty 1

## 2019-08-25 MED ORDER — HEPARIN SODIUM (PORCINE) 5000 UNIT/ML IJ SOLN
5000.0000 [IU] | Freq: Three times a day (TID) | INTRAMUSCULAR | Status: DC
  Administered 2019-08-25 – 2019-09-01 (×18): 5000 [IU] via SUBCUTANEOUS
  Filled 2019-08-25 (×18): qty 1

## 2019-08-25 MED ORDER — SODIUM CHLORIDE 0.9 % IV SOLN
2.0000 g | Freq: Once | INTRAVENOUS | Status: AC
  Administered 2019-08-25: 19:00:00 2 g via INTRAVENOUS
  Filled 2019-08-25: qty 2

## 2019-08-25 MED ORDER — LATANOPROST 0.005 % OP SOLN
1.0000 [drp] | Freq: Every day | OPHTHALMIC | Status: DC
  Administered 2019-08-25 – 2019-08-31 (×7): 1 [drp] via OPHTHALMIC
  Filled 2019-08-25: qty 2.5

## 2019-08-25 MED ORDER — ONDANSETRON HCL 4 MG/2ML IJ SOLN
4.0000 mg | Freq: Once | INTRAMUSCULAR | Status: AC
  Administered 2019-08-25: 01:00:00 4 mg via INTRAVENOUS
  Filled 2019-08-25: qty 2

## 2019-08-25 MED ORDER — SODIUM CHLORIDE 0.9 % IV SOLN
1.0000 g | Freq: Every day | INTRAVENOUS | Status: DC
  Filled 2019-08-25: qty 10

## 2019-08-25 MED ORDER — VANCOMYCIN HCL IN DEXTROSE 750-5 MG/150ML-% IV SOLN: 750.0000 mg | INTRAVENOUS | Status: DC

## 2019-08-25 MED ORDER — TRAZODONE HCL 50 MG PO TABS
50.0000 mg | ORAL_TABLET | Freq: Every day | ORAL | Status: DC
  Administered 2019-08-25 – 2019-08-31 (×7): 50 mg via ORAL
  Filled 2019-08-25 (×7): qty 1

## 2019-08-25 MED ORDER — ACETAMINOPHEN 325 MG PO TABS
650.0000 mg | ORAL_TABLET | Freq: Four times a day (QID) | ORAL | Status: DC | PRN
  Administered 2019-08-25 – 2019-08-29 (×3): 650 mg via ORAL
  Filled 2019-08-25 (×3): qty 2

## 2019-08-25 MED ORDER — METRONIDAZOLE IN NACL 5-0.79 MG/ML-% IV SOLN
500.0000 mg | Freq: Three times a day (TID) | INTRAVENOUS | Status: DC
  Administered 2019-08-25: 09:00:00 500 mg via INTRAVENOUS
  Filled 2019-08-25: qty 100

## 2019-08-25 MED ORDER — HYDROCORTISONE (PERIANAL) 2.5 % EX CREA
1.0000 "application " | TOPICAL_CREAM | Freq: Two times a day (BID) | CUTANEOUS | Status: DC
  Administered 2019-08-28 – 2019-08-31 (×4): 1 via RECTAL
  Filled 2019-08-25: qty 28.35

## 2019-08-25 MED ORDER — SODIUM CHLORIDE 0.9 % IV SOLN: 2.0000 g | INTRAVENOUS | Status: DC

## 2019-08-25 MED ORDER — CALCIUM ACETATE (PHOS BINDER) 667 MG PO CAPS
1334.0000 mg | ORAL_CAPSULE | Freq: Three times a day (TID) | ORAL | Status: DC
  Administered 2019-08-25 – 2019-09-01 (×18): 1334 mg via ORAL
  Filled 2019-08-25 (×19): qty 2

## 2019-08-25 MED ORDER — ONDANSETRON HCL 4 MG/2ML IJ SOLN: 4.0000 mg | Freq: Four times a day (QID) | INTRAMUSCULAR | Status: DC | PRN

## 2019-08-25 MED ORDER — TRAZODONE HCL 50 MG PO TABS
50.0000 mg | ORAL_TABLET | Freq: Once | ORAL | Status: AC
  Administered 2019-08-25: 04:00:00 50 mg via ORAL
  Filled 2019-08-25: qty 1

## 2019-08-25 MED ORDER — DEXTROSE 5 % IV SOLN
1.0000 g | INTRAVENOUS | Status: DC
  Filled 2019-08-25: qty 10

## 2019-08-25 NOTE — Progress Notes (Signed)
Very pleasant 68 year old male with past medical history of hypertension, recent progression of CKD to ESRD since September 2020 status post left upper extremity AV fistula (07/14/19), anemia requiring blood transfusion secondary to persistently bleeding diathesis catheter, hypertension who presented to the ED and was admitted on 11/3 early morning with persistent dizziness and racing heartbeat postdialysis.  On arrival, EMS noted patient to be febrile with T1 102 and was given Tylenol.  Patient reported chills at that time.  He was at negative Covid ED.  No sick contacts and has been otherwise in good health at home with wife.  He was placed in observation for SIRS positive with tachycardia and fever and started on broad-spectrum antibiotics.  Patient's blood cultures were +3/4 on 11/3 for Streptococcus.  Vancomycin/cefepime/Flagyl-> ceftriaxone.  Today patient had an episode of low-grade fever of 100.4.  He was admitted to inpatient.  Currently, patient states that he does have a history of a scratch on his left lower leg which has been healing over the past 1 to 2 months as well as right chest port and left upper extremity AV fistula, all of which could be a nidus for infection.  He denies any other complaints at this time and states he feels much better than when he was first admitted.   Physical Exam Vitals signs and nursing note reviewed.  Constitutional:      Appearance: Normal appearance.  HENT:     Head: Normocephalic and atraumatic.  Neck:     Musculoskeletal: Normal range of motion.  Cardiovascular:     Rate and Rhythm: Normal rate and regular rhythm.  Pulmonary:     Effort: Pulmonary effort is normal.     Breath sounds: Normal breath sounds.  Abdominal:     General: Abdomen is flat.     Palpations: Abdomen is soft.  Musculoskeletal: Normal range of motion.        General: No swelling.     Comments: Right chest port Left upper extremity AV fistula with gauze wrapped around the  arm.  No tenderness to palpation   Skin:    Comments:  patient has healing left lower extremity wound.  Neurological:     Mental Status: He is alert. Mental status is at baseline.  Psychiatric:        Mood and Affect: Mood normal.        Behavior: Behavior normal.    Streptococcus bacteremia Low-grade fever today 100.4 Narrow antibiotics from Vanco/cefepime/Flagyl to ceftriaxone Repeat blood cultures x2  ESRD on HD Will need to reach out to nephro in a.m. if patient needs to remain in the hospital for dialysis   Marva Panda, DO [facility]

## 2019-08-25 NOTE — Progress Notes (Addendum)
PHARMACY - PHYSICIAN COMMUNICATION CRITICAL VALUE ALERT - BLOOD CULTURE IDENTIFICATION (BCID)  Jesus Ayala is an 68 y.o. male who presented to [facility] on 08/24/2019 with a chief complaint of fever, dizziness, and racing heartbeat  Assessment: Sirs criteria met with tachycardia and fever. Possible source of infection is unknown at this time. Tmax 102.5, WBC 8.9, LA 1.7. Gram + cocci in chains 3/4 (2 aerobic and 1 anaerobic bottle). Strep species detected.  Name of physician (or Provider) Contacted: Dr. Marva Panda  Current antibiotics: Vancomycin 752m IV every MWF and cefepime 2g IV every MWF  Changes to prescribed antibiotics recommended:  Recommend narrowing to ceftriaxone 2g IV Q24H   Recent Results (from the past 720 hour(s))  Blood culture (routine x 2)     Status: None (Preliminary result)   Collection Time: 08/24/19  8:17 PM   Specimen: BLOOD RIGHT FOREARM  Result Value Ref Range Status   Specimen Description BLOOD RIGHT FOREARM  Final   Special Requests   Final    BOTTLES DRAWN AEROBIC AND ANAEROBIC Blood Culture adequate volume   Culture  Setup Time   Final    GRAM POSITIVE COCCI IN CHAINS ANAEROBIC BOTTLE ONLY CRITICAL RESULT CALLED TO, READ BACK BY AND VERIFIED WITH:Samuel JesterPUnion AT 0[place]11/3/20 BY DRush LandmarkPerformed at [facility] [address], [place] [address]   Culture GRAM POSITIVE COCCI  Final   Report Status PENDING  Incomplete  SARS Coronavirus 2 by RT PCR (hospital order, [facility]) Nasopharyngeal Nasopharyngeal Swab     Status: None   Collection Time: 08/24/19  9:46 PM   Specimen: Nasopharyngeal Swab  Result Value Ref Range Status   SARS Coronavirus 2 NEGATIVE NEGATIVE Final    Comment: (NOTE) If result is NEGATIVE SARS-CoV-2 target nucleic acids are NOT DETECTED. The SARS-CoV-2 RNA is generally detectable in upper and lower  respiratory specimens during the acute phase of infection. The  lowest  concentration of SARS-CoV-2 viral copies this assay can detect is 250  copies / mL. A negative result does not preclude SARS-CoV-2 infection  and should not be used as the sole basis for treatment or other  patient management decisions.  A negative result may occur with  improper specimen collection / handling, submission of specimen other  than nasopharyngeal swab, presence of viral mutation(s) within the  areas targeted by this assay, and inadequate number of viral copies  (<250 copies / mL). A negative result must be combined with clinical  observations, patient history, and epidemiological information. If result is POSITIVE SARS-CoV-2 target nucleic acids are DETECTED. The SARS-CoV-2 RNA is generally detectable in upper and lower  respiratory specimens dur ing the acute phase of infection.  Positive  results are indicative of active infection with SARS-CoV-2.  Clinical  correlation with patient history and other diagnostic information is  necessary to determine patient infection status.  Positive results do  not rule out bacterial infection or co-infection with other viruses. If result is PRESUMPTIVE POSTIVE SARS-CoV-2 nucleic acids MAY BE PRESENT.   A presumptive positive result was obtained on the submitted specimen  and confirmed on repeat testing.  While 2019 novel coronavirus  (SARS-CoV-2) nucleic acids may be present in the submitted sample  additional confirmatory testing may be necessary for epidemiological  and / or clinical management purposes  to differentiate between  SARS-CoV-2 and other Sarbecovirus currently known to infect humans.  If clinically indicated additional testing  with an alternate test  methodology [phone]) is advised. The SARS-CoV-2 RNA is generally  detectable in upper and lower respiratory sp ecimens during the acute  phase of infection. The expected result is Negative. Fact Sheet for Patients:   StrictlyIdeas.no Fact Sheet for Healthcare Providers: BankingDealers.co.za This test is not yet approved or cleared by the [place] FDA and has been authorized for detection and/or diagnosis of SARS-CoV-2 by FDA under an Emergency Use Authorization (EUA).  This EUA will remain in effect (meaning this test can be used) for the duration of the COVID-19 declaration under Section 564(b)(1) of the Act, 21 U.S.C. section 360bbb-3(b)(1), unless the authorization is terminated or revoked sooner. Performed at [facility], [place] [address], [place], [place] [address]   MRSA PCR Screening     Status: None   Collection Time: 08/25/19  2:11 AM  Result Value Ref Range Status   MRSA by PCR NEGATIVE NEGATIVE Final    Comment:        The GeneXpert MRSA Assay (FDA approved for NASAL specimens only), is one component of a comprehensive MRSA colonization surveillance program. It is not intended to diagnose MRSA infection nor to guide or monitor treatment for MRSA infections. Performed at [facility], [place] [address], [place], [place] [address]      Results for orders placed or performed during the hospital encounter of 08/24/19  Blood Culture ID Panel (Reflexed) (Collected: 08/24/2019  8:17 PM)  Result Value Ref Range   Enterococcus species NOT DETECTED NOT DETECTED   Listeria monocytogenes NOT DETECTED NOT DETECTED   Staphylococcus species NOT DETECTED NOT DETECTED   Staphylococcus aureus (BCID) NOT DETECTED NOT DETECTED   Streptococcus species DETECTED (A) NOT DETECTED   Streptococcus agalactiae NOT DETECTED NOT DETECTED   Streptococcus pneumoniae NOT DETECTED NOT DETECTED   Streptococcus pyogenes NOT DETECTED NOT DETECTED   Acinetobacter baumannii NOT DETECTED NOT DETECTED   Enterobacteriaceae species NOT DETECTED NOT DETECTED   Enterobacter cloacae complex NOT DETECTED NOT DETECTED   Escherichia coli NOT DETECTED NOT DETECTED    Klebsiella oxytoca NOT DETECTED NOT DETECTED   Klebsiella pneumoniae NOT DETECTED NOT DETECTED   Proteus species NOT DETECTED NOT DETECTED   Serratia marcescens NOT DETECTED NOT DETECTED   Haemophilus influenzae NOT DETECTED NOT DETECTED   Neisseria meningitidis NOT DETECTED NOT DETECTED   Pseudomonas aeruginosa NOT DETECTED NOT DETECTED   Candida albicans NOT DETECTED NOT DETECTED   Candida glabrata NOT DETECTED NOT DETECTED   Candida krusei NOT DETECTED NOT DETECTED   Candida parapsilosis NOT DETECTED NOT DETECTED   Candida tropicalis NOT DETECTED NOT DETECTED    Kennon Holter, PharmD PGY1 Ambulatory Care Pharmacy Resident [employer] Phone: [phone] 08/25/2019  8:58 AM

## 2019-08-25 NOTE — ED Notes (Signed)
ED TO INPATIENT HANDOFF REPORT  ED Nurse Name and Phone #: [phone] Lauree Chandler., RN  S Name/Age/Gender Jesus Ayala 68 y.o. male Room/Bed: 028C/028C  Code Status   Code Status: Prior  Home/SNF/Other Home Patient oriented to: self, place, time and situation Is this baseline? Yes   Triage Complete: Triage complete  Chief Complaint Fever, Dizzy  Triage Note Pt presents to ED from home BIB [facility]. Pt c/o dizziness and racing heart beginning around 1800. Pt reports going to dialysis today and having a normal dialysis w/ no fever reported there. EMS given 1000 mg of tylenol around 1915. EMS reports temp 102 orally.   Allergies Allergies  Allergen Reactions  . Ace Inhibitors Swelling and Other (See Comments)  . Enalapril Swelling    ANGIOEDEMA of lips  . Iron Sucrose Itching    Level of Care/Admitting Diagnosis ED Disposition    ED Disposition Condition Comment   Admit  Hospital Area: [facility] [[id number]]  Level of Care: Progressive [102]  Admit to Progressive based on following criteria: CARDIOVASCULAR & THORACIC of moderate stability with acute coronary syndrome symptoms/low risk myocardial infarction/hypertensive urgency/arrhythmias/heart failure potentially compromising stability and stable post cardiovascular intervention patients.  Covid Evaluation: Confirmed COVID Negative  Diagnosis: SIRS (systemic inflammatory response syndrome) ([place]) [id number]  Admitting Physician: Toy Baker [[id number]]  Attending Physician: Toy Baker [[id number]]  PT Class (Do Not Modify): Observation [104]  PT Acc Code (Do Not Modify): Observation [10022]       B Medical/Surgery History Past Medical History:  Diagnosis Date  . Anemia   . Anxiety    situational  . ESRD (end stage renal disease) ([place])    MWF- [facility]  . HLD (hyperlipidemia)   . Hypertension    Past Surgical History:  Procedure Laterality Date  . AV FISTULA PLACEMENT Left  07/14/2019   Procedure: INSERTION OF ARTERIOVENOUS (AV) GORE-TEX GRAFT ARM;  Surgeon: Angelia Mould, MD;  Location: [facility];  Service: Vascular;  Laterality: Left;  . COLONOSCOPY    . HERNIA REPAIR     unbicial  . INSERTION OF DIALYSIS CATHETER Right 06/25/2019   Procedure: INSERTION OF TUNNEL DIALYSIS CATHETER;  Surgeon: Waynetta Sandy, MD;  Location: [facility];  Service: Vascular;  Laterality: Right;     A IV Location/Drains/Wounds Patient Lines/Drains/Airways Status   Active Line/Drains/Airways    Name:   Placement date:   Placement time:   Site:   Days:   Peripheral IV 08/24/19 Right Forearm   08/24/19    2025    Forearm   1   Peripheral IV 08/24/19 Right Hand   08/24/19    2025    Hand   1   Fistula / Graft Left Upper arm Arteriovenous vein graft   07/14/19    1015    Upper arm   42   Hemodialysis Catheter Right Internal jugular Double lumen Permanent (Tunneled)   06/25/19    1511    Internal jugular   61   Incision (Closed) 06/25/19 Neck Right   06/25/19    1535     61   Incision (Closed) 07/14/19 Arm Left   07/14/19    1016     42          Intake/Output Last 24 hours No intake or output data in the 24 hours ending 08/25/19 0117  Labs/Imaging Results for orders placed or performed during the hospital encounter of 08/24/19 (from the past 48 hour(s))  Comprehensive metabolic  panel     Status: Abnormal   Collection Time: 08/24/19  8:12 PM  Result Value Ref Range   Sodium 137 135 - 145 mmol/L   Potassium 3.8 3.5 - 5.1 mmol/L   Chloride 96 (L) 98 - 111 mmol/L   CO2 29 22 - 32 mmol/L   Glucose, Bld 122 (H) 70 - 99 mg/dL   BUN 16 8 - 23 mg/dL   Creatinine, Ser 6.12 (H) 0.61 - 1.24 mg/dL   Calcium 9.2 8.9 - 10.3 mg/dL   Total Protein 7.5 6.5 - 8.1 g/dL   Albumin 4.2 3.5 - 5.0 g/dL   AST 16 15 - 41 U/L   ALT 17 0 - 44 U/L   Alkaline Phosphatase 54 38 - 126 U/L   Total Bilirubin 0.5 0.3 - 1.2 mg/dL   GFR calc non Af Amer 9 (L) >60 mL/min   GFR calc Af Amer 10  (L) >60 mL/min   Anion gap 12 5 - 15    Comment: Performed at [facility], [place] [address], [place], [place] [address]  CBC with Differential     Status: Abnormal   Collection Time: 08/24/19  8:12 PM  Result Value Ref Range   WBC 7.3 4.0 - 10.5 K/uL   RBC 3.66 (L) 4.22 - 5.81 MIL/uL   Hemoglobin 10.8 (L) 13.0 - 17.0 g/dL   HCT 32.8 (L) 39.0 - 52.0 %   MCV 89.6 80.0 - 100.0 fL   MCH 29.5 26.0 - 34.0 pg   MCHC 32.9 30.0 - 36.0 g/dL   RDW 16.9 (H) 11.5 - 15.5 %   Platelets 181 150 - 400 K/uL   nRBC 0.0 0.0 - 0.2 %   Neutrophils Relative % 88 %   Neutro Abs 6.3 1.7 - 7.7 K/uL   Lymphocytes Relative 6 %   Lymphs Abs 0.4 (L) 0.7 - 4.0 K/uL   Monocytes Relative 6 %   Monocytes Absolute 0.5 0.1 - 1.0 K/uL   Eosinophils Relative 0 %   Eosinophils Absolute 0.0 0.0 - 0.5 K/uL   Basophils Relative 0 %   Basophils Absolute 0.0 0.0 - 0.1 K/uL   Immature Granulocytes 0 %   Abs Immature Granulocytes 0.03 0.00 - 0.07 K/uL    Comment: Performed at [facility], [place] [address] [place], [place] [address]  Lactic acid, plasma     Status: None   Collection Time: 08/24/19  8:13 PM  Result Value Ref Range   Lactic Acid, Venous 1.3 0.5 - 1.9 mmol/L    Comment: Performed at [facility] [address] [place], [place] [address]  SARS Coronavirus 2 by RT PCR (hospital order, performed in [facility] hospital lab) Nasopharyngeal Nasopharyngeal Swab     Status: None   Collection Time: 08/24/19  9:46 PM   Specimen: Nasopharyngeal Swab  Result Value Ref Range   SARS Coronavirus 2 NEGATIVE NEGATIVE    Comment: (NOTE) If result is NEGATIVE SARS-CoV-2 target nucleic acids are NOT DETECTED. The SARS-CoV-2 RNA is generally detectable in upper and lower  respiratory specimens during the acute phase of infection. The lowest  concentration of SARS-CoV-2 viral copies this assay can detect is 250  copies / mL. A negative result does not preclude SARS-CoV-2 infection  and should not be used  as the sole basis for treatment or other  patient management decisions.  A negative result may occur with  improper specimen collection / handling, submission of specimen other  than nasopharyngeal swab, presence of viral mutation(s) within the  areas targeted by this assay, and inadequate number of viral copies  (<250 copies / mL). A negative result must be combined with clinical  observations, patient history, and epidemiological information. If result is POSITIVE SARS-CoV-2 target nucleic acids are DETECTED. The SARS-CoV-2 RNA is generally detectable in upper and lower  respiratory specimens dur ing the acute phase of infection.  Positive  results are indicative of active infection with SARS-CoV-2.  Clinical  correlation with patient history and other diagnostic information is  necessary to determine patient infection status.  Positive results do  not rule out bacterial infection or co-infection with other viruses. If result is PRESUMPTIVE POSTIVE SARS-CoV-2 nucleic acids MAY BE PRESENT.   A presumptive positive result was obtained on the submitted specimen  and confirmed on repeat testing.  While 2019 novel coronavirus  (SARS-CoV-2) nucleic acids may be present in the submitted sample  additional confirmatory testing may be necessary for epidemiological  and / or clinical management purposes  to differentiate between  SARS-CoV-2 and other Sarbecovirus currently known to infect humans.  If clinically indicated additional testing with an alternate test  methodology [phone]) is advised. The SARS-CoV-2 RNA is generally  detectable in upper and lower respiratory sp ecimens during the acute  phase of infection. The expected result is Negative. Fact Sheet for Patients:  StrictlyIdeas.no Fact Sheet for Healthcare Providers: BankingDealers.co.za This test is not yet approved or cleared by the [place] FDA and has been authorized for  detection and/or diagnosis of SARS-CoV-2 by FDA under an Emergency Use Authorization (EUA).  This EUA will remain in effect (meaning this test can be used) for the duration of the COVID-19 declaration under Section 564(b)(1) of the Act, 21 U.S.C. section 360bbb-3(b)(1), unless the authorization is terminated or revoked sooner. Performed at [facility], [place] [address] [place], [place] [address]   Lactic acid, plasma     Status: None   Collection Time: 08/24/19 11:05 PM  Result Value Ref Range   Lactic Acid, Venous 1.7 0.5 - 1.9 mmol/L    Comment: Performed at [facility] [address] [place], [place] [address]   Dg Chest Portable 1 View  Result Date: 08/24/2019 CLINICAL DATA:  Fever EXAM: PORTABLE CHEST 1 VIEW COMPARISON:  06/25/2019 FINDINGS: Right-sided central venous port tip over the SVC. No focal opacity or pleural effusion. Normal heart size. No pneumothorax IMPRESSION: No active disease. Electronically Signed   By: Donavan Foil M.D.   On: 08/24/2019 20:32    Pending Labs Unresulted Labs (From admission, onward)    Start     Ordered   08/26/19 0500  Procalcitonin  Daily,   R     08/25/19 0026   08/25/19 0030  Respiratory Panel by PCR  (Respiratory virus panel with precautions)  Once,   STAT     08/25/19 0029   08/25/19 0027  Lactate dehydrogenase  Add-on,   AD     08/25/19 0026   08/25/19 0027  Vitamin B12  (Anemia Panel (PNL))  Once,   STAT     08/25/19 0026   08/25/19 0027  Folate  (Anemia Panel (PNL))  Once,   STAT     08/25/19 0026   08/25/19 0027  Iron and TIBC  (Anemia Panel (PNL))  Once,   STAT     08/25/19 0026   08/25/19 0027  Ferritin  (Anemia Panel (PNL))  Once,   STAT  08/25/19 0026   08/25/19 0027  Reticulocytes  (Anemia Panel (PNL))  Once,   STAT     08/25/19 0026   08/25/19 0027  Procalcitonin - Baseline  ONCE - STAT,   STAT     08/25/19 0026   08/25/19 0026  Influenza panel by PCR (type A & B)  (Influenza PCR Panel)  Once,   STAT      08/25/19 0026   08/24/19 2014  Blood culture (routine x 2)  BLOOD CULTURE X 2,   STAT     08/24/19 2013   08/24/19 2012  Urinalysis, Routine w reflex microscopic  ONCE - STAT,   STAT     08/24/19 2013   Signed and Held  Lactic acid, plasma  STAT Now then every 3 hours,   STAT     Signed and Held   Signed and Held  Procalcitonin  Tomorrow morning,   R     Signed and Held   Signed and Held  Protime-INR  ONCE - STAT,   R     Signed and Held   Signed and Held  APTT  ONCE - STAT,   R     Signed and Held   Signed and Held  Fibrinogen  ONCE - STAT,   R     Signed and Held          Vitals/Pain Today's Vitals   08/24/19 2323 08/24/19 2324 08/24/19 2330 08/25/19 0015  BP: (!) 169/71  (!) 164/71 (!) 161/73  Pulse: (!) 111  (!) 113 (!) 110  Resp: 20  20 18   Temp:  (!) 102.5 F (39.2 C)    TempSrc:  Oral    SpO2: 99%  98% 100%  Weight:      Height:      PainSc:        Isolation Precautions Droplet precaution  Medications Medications  vancomycin (VANCOCIN) 1,250 mg in sodium chloride 0.9 % 250 mL IVPB (1,250 mg Intravenous New Bag/Given 08/25/19 0022)  piperacillin-tazobactam (ZOSYN) IVPB 3.375 g (3.375 g Intravenous New Bag/Given 08/25/19 0048)  hydrALAZINE (APRESOLINE) tablet 25 mg (25 mg Oral Given 08/24/19 2233)  acetaminophen (TYLENOL) tablet 650 mg (650 mg Oral Given 08/24/19 2235)  ondansetron (ZOFRAN) injection 4 mg (4 mg Intravenous Given 08/25/19 0045)    Mobility walks Low fall risk   Focused Assessments Cardiac Assessment Handoff:  Cardiac Rhythm: Sinus tachycardia No results found for: CKTOTAL, CKMB, CKMBINDEX, TROPONINI No results found for: DDIMER Does the Patient currently have chest pain? No   , Pulmonary Assessment Handoff:  Lung sounds:            R Recommendations: See Admitting Provider Note  Report given to:   Additional Notes:

## 2019-08-25 NOTE — H&P (Signed)
Jesus Ayala [id number] DOB: [birth year] DOA: 08/24/2019     PCP: Seward Carol, PA-C   Outpatient Specialists:     NEphrology: [facility]    Patient arrived to ER on 08/24/19 at [facility]  Patient coming from: home Lives  With family    Chief Complaint:   Chief Complaint  Patient presents with  . Fever    HPI: Jesus Ayala is a 68 y.o. male with medical history significant of HTN, recent progression of CKD to ESRD since September 2020, anemia requiring blood transfusion secondary to persistently bleeding dialysis catheter, hypertension    Presented with   dizziness and racing heartbeat started around 6 PM he had dialysis today and tolerated it well.  On EMS arrival noted to be febrile up to 102 he was given a Tylenol She reports some chills but no dysuria no hematuria no nausea no vomiting no chest pain or shortness of breath no cough no urinary symptoms no sick contacts He lives with his wife has been doing well. Denies any cough or chest pain no nausea no vomiting no diarrhea his access port has been doing well  Infectious risk factors:  Reports  fever,     In  ER RAPID COVID TEST NEGATIVE   Lab Results  Component Value Date   [place] NEGATIVE 08/24/2019   [place] NOT DETECTED 07/10/2019   [place] NEGATIVE 06/25/2019   [facility] NOT DETECTED 04/19/2019     Regarding pertinent Chronic problems:    End-stage renal disease dialyzed on Monday Wednesday Friday at [facility]   Hyperlipidemia - not on statins   HTN on Norvasc, hydralazine, metoprolol   - last echo  04/20/2019 ejection fraction of 60-65% Left ventricular diastolic Doppler parameters are consistent with  impaired relaxation.    While in ER: Blood cultures obtained patient started on broad-spectrum antibiotics vancomycin and Zosyn Presumed Sirs of unknown etiology Chest x-ray unremarkable Covid neg  The following Work up has been ordered so far:  Orders  Placed This Encounter  Procedures  . Blood culture (routine x 2)  . SARS Coronavirus 2 by RT PCR (hospital order, performed in [facility] hospital lab) Nasopharyngeal Nasopharyngeal Swab  . DG Chest Portable 1 View  . Comprehensive metabolic panel  . CBC with Differential  . Urinalysis, Routine w reflex microscopic  . Lactic acid, plasma  . Consult to hospitalist  ALL PATIENTS BEING ADMITTED/HAVING PROCEDURES NEED COVID-19 SCREENING  . Consult to hospitalist  ALL PATIENTS BEING ADMITTED/HAVING PROCEDURES NEED COVID-19 SCREENING  . Airborne and Contact precautions     Following Medications were ordered in ER: Medications  vancomycin (VANCOCIN) 1,250 mg in sodium chloride 0.9 % 250 mL IVPB (has no administration in time range)  piperacillin-tazobactam (ZOSYN) IVPB 3.375 g (has no administration in time range)  hydrALAZINE (APRESOLINE) tablet 25 mg (25 mg Oral Given 08/24/19 2233)  acetaminophen (TYLENOL) tablet 650 mg (650 mg Oral Given 08/24/19 2235)        Consult Orders  (From admission, onward)         Start     Ordered   08/24/19 2358  Consult to hospitalist  ALL PATIENTS BEING ADMITTED/HAVING PROCEDURES NEED COVID-19 SCREENING  Once    Comments: ALL PATIENTS BEING ADMITTED/HAVING PROCEDURES NEED COVID-19 SCREENING  Provider:  (Not yet assigned)  Question Answer Comment  Place call to: [facility]   Reason for Consult Admit      08/24/19 2357   08/24/19 2331  Consult  to hospitalist  ALL PATIENTS BEING ADMITTED/HAVING PROCEDURES NEED COVID-19 SCREENING Paged by Marcia Brash  Once    Comments: ALL PATIENTS BEING ADMITTED/HAVING PROCEDURES NEED COVID-19 SCREENING  Provider:  (Not yet assigned)  Question Answer Comment  Place call to: [facility]   Reason for Consult Admit      08/24/19 2330            Significant initial  Findings: Abnormal Labs Reviewed  COMPREHENSIVE METABOLIC PANEL - Abnormal; Notable for the following components:      Result  Value   Chloride 96 (*)    Glucose, Bld 122 (*)    Creatinine, Ser 6.12 (*)    GFR calc non Af Amer 9 (*)    GFR calc Af Amer 10 (*)    All other components within normal limits  CBC WITH DIFFERENTIAL/PLATELET - Abnormal; Notable for the following components:   RBC 3.66 (*)    Hemoglobin 10.8 (*)    HCT 32.8 (*)    RDW 16.9 (*)    Lymphs Abs 0.4 (*)    All other components within normal limits     Otherwise labs showing:    Recent Labs  Lab 08/24/19 2012  NA 137  K 3.8  CO2 29  GLUCOSE 122*  BUN 16  CREATININE 6.12*  CALCIUM 9.2    Cr    stable,   Lab Results  Component Value Date   CREATININE 6.12 (H) 08/24/2019   CREATININE 8.15 (H) 07/24/2019   CREATININE 10.09 (H) 06/26/2019    Recent Labs  Lab 08/24/19 2012  AST 16  ALT 17  ALKPHOS 54  BILITOT 0.5  PROT 7.5  ALBUMIN 4.2   Lab Results  Component Value Date   CALCIUM 9.2 08/24/2019   PHOS 5.8 (H) 04/22/2019      WBC         Component Value Date/Time   WBC 7.3 08/24/2019 2012   ANC    Component Value Date/Time   NEUTROABS 6.3 08/24/2019 2012   ALC No components found for: LYMPHAB    Plt: Lab Results  Component Value Date   PLT 181 08/24/2019     Lactic Acid, Venous    Component Value Date/Time   LATICACIDVEN 1.7 08/24/2019 2305    Procalcitonin   Ordered   COVID-19 Labs  No results for input(s): DDIMER, FERRITIN, LDH, CRP in the last 72 hours.  Lab Results  Component Value Date   SARSCOV2NAA NEGATIVE 08/24/2019   SARSCOV2NAA NOT DETECTED 07/10/2019   SARSCOV2NAA NEGATIVE 06/25/2019   SARSCOV2NAA NOT DETECTED 04/19/2019    ABG    Component Value Date/Time   PHART 7.420 12/07/2009 1445   PCO2ART 36.5 12/07/2009 1445   PO2ART 155.0 (H) 12/07/2009 1445   HCO3 23.2 12/07/2009 1445   TCO2 24.4 12/07/2009 1445   ACIDBASEDEF 0.6 12/07/2009 1445   O2SAT 99.5 12/07/2009 1445      HG/HCT  stable,      Component Value Date/Time   HGB 10.8 (L) 08/24/2019 2012   HCT  32.8 (L) 08/24/2019 2012        ECG: Ordered     DM  labs:  HbA1C: Recent Labs    04/19/19 0848  HGBA1C 4.8       CBG (last 3)  No results for input(s): GLUCAP in the last 72 hours.     UA  ordered   Urine analysis:    Component Value Date/Time   COLORURINE STRAW (A) 04/19/2019 0505  APPEARANCEUR CLEAR 04/19/2019 0505   LABSPEC 1.010 04/19/2019 0505   PHURINE 5.0 04/19/2019 0505   GLUCOSEU NEGATIVE 04/19/2019 0505   HGBUR MODERATE (A) 04/19/2019 0505   BILIRUBINUR NEGATIVE 04/19/2019 0505   KETONESUR NEGATIVE 04/19/2019 0505   PROTEINUR 100 (A) 04/19/2019 0505   UROBILINOGEN 1.0 12/12/2009 0201   NITRITE NEGATIVE 04/19/2019 0505   LEUKOCYTESUR NEGATIVE 04/19/2019 0505    Ordered   CXR -  NON acute     ED Triage Vitals  Enc Vitals Group     BP 08/24/19 2015 (!) 162/79     Pulse Rate 08/24/19 2022 (!) 109     Resp 08/24/19 2015 20     Temp 08/24/19 2005 (!) 102.1 F (38.9 C)     Temp Source 08/24/19 2005 Oral     SpO2 08/24/19 2022 98 %     Weight 08/24/19 2002 161 lb (73 kg)     Height 08/24/19 2002 '5\' 11"'  (1.803 m)     Head Circumference --      Peak Flow --      Pain Score 08/24/19 2002 0     Pain Loc --      Pain Edu? --      Excl. in [place]? --   TMAX(24)@       Latest  Blood pressure (!) 164/71, pulse (!) 113, temperature (!) 102.5 F (39.2 C), temperature source Oral, resp. rate 20, height '5\' 11"'  (1.803 m), weight 73 kg, SpO2 98 %.     Hospitalist was called for admission for SIRS   Review of Systems:    Pertinent positives include:  Fevers, chills, fatigue  Constitutional:  No weight loss, night sweats,, weight loss  HEENT:  No headaches, Difficulty swallowing,Tooth/dental problems,Sore throat,  No sneezing, itching, ear ache, nasal congestion, post nasal drip,  Cardio-vascular:  No chest pain, Orthopnea, PND, anasarca, dizziness, palpitations.no Bilateral lower extremity swelling  GI:  No heartburn, indigestion, abdominal pain,  nausea, vomiting, diarrhea, change in bowel habits, loss of appetite, melena, blood in stool, hematemesis Resp:  no shortness of breath at rest. No dyspnea on exertion, No excess mucus, no productive cough, No non-productive cough, No coughing up of blood.No change in color of mucus.No wheezing. Skin:  no rash or lesions. No jaundice GU:  no dysuria, change in color of urine, no urgency or frequency. No straining to urinate.  No flank pain.  Musculoskeletal:  No joint pain or no joint swelling. No decreased range of motion. No back pain.  Psych:  No change in mood or affect. No depression or anxiety. No memory loss.  Neuro: no localizing neurological complaints, no tingling, no weakness, no double vision, no gait abnormality, no slurred speech, no confusion  All systems reviewed and apart from [place] all are negative  Past Medical History:   Past Medical History:  Diagnosis Date  . Anemia   . Anxiety    situational  . ESRD (end stage renal disease) ([place])    MWF- [place]  . HLD (hyperlipidemia)   . Hypertension       Past Surgical History:  Procedure Laterality Date  . AV FISTULA PLACEMENT Left 07/14/2019   Procedure: INSERTION OF ARTERIOVENOUS (AV) GORE-TEX GRAFT ARM;  Surgeon: Angelia Mould, MD;  Location: [facility];  Service: Vascular;  Laterality: Left;  . COLONOSCOPY    . HERNIA REPAIR     unbicial  . INSERTION OF DIALYSIS CATHETER Right 06/25/2019   Procedure: INSERTION OF TUNNEL DIALYSIS CATHETER;  Surgeon: Waynetta Sandy, MD;  Location[facility];  Service: Vascular;  Laterality: Right;    Social History:  Ambulatory   Independently    reports that he quit smoking about 33 years ago. He quit after 2.00 years of use. He has never used smokeless tobacco. He reports previous alcohol use. He reports that he does not use drugs.     Family History:   Family History  Problem Relation Age of Onset  . Lung cancer Father   . Hypertension Brother      Allergies: Allergies  Allergen Reactions  . Ace Inhibitors Swelling and Other (See Comments)  . Enalapril Swelling    ANGIOEDEMA of lips  . Iron Sucrose Itching     Prior to Admission medications   Medication Sig Start Date End Date Taking? Authorizing Provider  acetaminophen (TYLENOL) 500 MG tablet Take 500-1,000 mg by mouth every 6 (six) hours as needed (pain).    [provider]  amLODipine (NORVASC) 10 MG tablet Take 10 mg by mouth daily. 04/12/19   [provider]  aspirin 81 MG chewable tablet Chew 81 mg by mouth daily.    [provider]  B Complex-C-Zn-Folic Acid (DIALYVITE 945-WTUU 15 PO) Take 1 tablet by mouth daily at 3 pm.    [provider]  calcitRIOL (ROCALTROL) 0.25 MCG capsule Take 0.25 mcg by mouth daily. 06/12/19   [provider]  calcium acetate (PHOSLO) 667 MG capsule Take 1 capsule (667 mg total) by mouth 3 (three) times daily with meals. Patient taking differently: Take 1,334 mg by mouth 3 (three) times daily with meals.  04/22/19   Lavina Hamman, MD  furosemide (LASIX) 80 MG tablet Take 80 mg by mouth daily. 01/22/19   [provider]  hydrALAZINE (APRESOLINE) 25 MG tablet Take 1 tablet (25 mg total) by mouth 3 (three) times daily. 04/22/19   Lavina Hamman, MD  hydrocortisone (ANUSOL-HC) 2.5 % rectal cream Place 1 application rectally 2 (two) times daily. 07/24/19   Deno Etienne, DO  hydrocortisone (ANUSOL-HC) 25 MG suppository Place 1 suppository (25 mg total) rectally 2 (two) times daily. For 7 days 07/24/19   Deno Etienne, DO  latanoprost (XALATAN) 0.005 % ophthalmic solution Place 1 drop into both eyes at bedtime.    [provider]  metoprolol tartrate (LOPRESSOR) 100 MG tablet Take 100 mg by mouth 2 (two) times a day. 04/12/19   [provider]  nitroGLYCERIN (NITROSTAT) 0.4 MG SL tablet Place 0.4 mg under the tongue every 5 (five) minutes as needed for chest pain.  03/27/19   [provider]  oxyCODONE-acetaminophen (PERCOCET) 5-325 MG tablet Take 1 tablet by mouth every 6 (six) hours as needed. 07/21/19   Rhyne, Hulen Shouts, PA-C  traZODone (DESYREL) 50 MG tablet Take 50 mg by mouth at bedtime.    [provider]   Physical Exam: Blood pressure (!) 164/71, pulse (!) 113, temperature (!) 102.5 F (39.2 C), temperature source Oral, resp. rate 20, height '5\' 11"'  (1.803 m), weight 73 kg, SpO2 98 %. 1. General:  in No Acute distres    Chronically ill  -appearing 2. Psychological: Alert and  Oriented 3. Head/ENT:   Moist  Mucous Membranes                          Head Non traumatic, neck supple  Poor Dentition 4. SKIN:  decreased Skin turgor,  Skin clean Dry and intact no rash, dialysis catheter in place nontender no erythema noted 5. Heart: Regular rate and rhythm no Murmur, no Rub or gallop 6. Lungs:  no wheezes or crackles   7. Abdomen: Soft,   non-tender, Non distended bowel sounds present 8. Lower extremities: no clubbing, cyanosis, no  edema 9. Neurologically Grossly intact, moving all 4 extremities equally  10. MSK: Normal range of motion   All other LABS:     Recent Labs  Lab 08/24/19 2012  WBC 7.3  NEUTROABS 6.3  HGB 10.8*  HCT 32.8*  MCV 89.6  PLT 181     Recent Labs  Lab 08/24/19 2012  NA 137  K 3.8  CL 96*  CO2 29  GLUCOSE 122*  BUN 16  CREATININE 6.12*  CALCIUM 9.2     Recent Labs  Lab 08/24/19 2012  AST 16  ALT 17  ALKPHOS 54  BILITOT 0.5  PROT 7.5  ALBUMIN 4.2       Cultures:    Component Value Date/Time   SDES GALL BLADDER BILE 12/15/2009 1700   SDES GALL BLADDER BILE 12/15/2009 1700   SPECREQUEST PATIENT ON FOLLOWING CIPRO,ZOSYN,VANC 12/15/2009 1700   SPECREQUEST PATIENT ON FOLLOWING CIPRO,ZOSYN,VANC 12/15/2009 1700   CULT ABUNDANT ENTEROBACTER AEROGENES 12/15/2009 1700   CULT NO ANAEROBES ISOLATED 12/15/2009 1700   REPTSTATUS 12/18/2009 FINAL 12/15/2009 1700   REPTSTATUS 12/20/2009 FINAL  12/15/2009 1700     Radiological Exams on Admission: Dg Chest Portable 1 View  Result Date: 08/24/2019 CLINICAL DATA:  Fever EXAM: PORTABLE CHEST 1 VIEW COMPARISON:  06/25/2019 FINDINGS: Right-sided central venous port tip over the SVC. No focal opacity or pleural effusion. Normal heart size. No pneumothorax IMPRESSION: No active disease. Electronically Signed   By: Donavan Foil M.D.   On: 08/24/2019 20:32    Chart has been reviewed    Assessment/Plan  68 y.o. male with medical history significant of HTN, recent progression of CKD to ESRD since September 2020, anemia requiring blood transfusion secondary to persistently bleeding dialysis catheter, hypertension     Admitted for SIRS   Present on Admission: . SIRS (systemic inflammatory response syndrome) (HCC) -  -SIRS criteria met with  tachycardia ,   fever.   Covid negative chest x-ray showing no infiltrates  without evidence of end organ damage such as  acute renal failure,  Hypoxia  elevated lactic acid  encephalopathy -Most likely source being: , undetermined  - Obtain serial lactic acid and procalcitonin level.  - Initiate IV antibiotics   - await results of blood and urine culture  - Rehydrate aggressively  . Hypertension -chronic stable restart when able to tolerate . ESRD (end stage renal disease) ([place]) if still here on Wednesday will need nephrology consult for dialysis . HLD (hyperlipidemia) chronic stable . Normocytic anemia chronic most likely secondary to anemia of chronic disease will obtain anemia panel    Other plan as per orders.  DVT prophylaxis:  SCD      Code Status:  FULL CODE  as per patient   I had personally discussed CODE STATUS with patient   Family Communication:   Family not at  Bedside    Disposition Plan:      To home once workup is complete and patient is stable  Consults called: none  Admission status:  ED Disposition    None      Obs      Level of care      SDU tele indefinitely please discontinue once patient no longer qualifies  Precautions  Airborne and Contact precautions  PPE: Used by the provider:   P100  eye Goggles,  Gloves   gown     Lorenza Shakir 08/25/2019, 1:41 AM     [facility]     after 2 AM please page floor coverage PA If 7AM-7PM, please contact the day team taking care of the patient using [email or web address]

## 2019-08-25 NOTE — TOC Initial Note (Signed)
Transition of Care [facility]) - Initial/Assessment Note    Patient Details  Name: Jesus Ayala MRN: [id number] Date of Birth: 01-Jul-1951  [facility] of [facility] CM/SW Contact:    Bartholomew Crews, RN Phone Number: [phone] 08/25/2019, 4:24 PM  Clinical Narrative:                 Spoke with patient via phone. PTA home with spouse. Drives self to hemodialysis, but states center won't let him leave if he doesn't feel well and if needed his spouse can pick him up. Attends HD on MWF at [facility] with 12pm chair time. Only DME he has is a cane. No HH services. No concerns about renal diet or medication management.   Patient advised that he may discharge tomorrow after hemodialysis. TOC following for transition needs.   Expected Discharge Plan: Home/Self Care Barriers to Discharge: Continued Medical Work up   Patient Goals and CMS Choice Patient states their goals for this hospitalization and ongoing recovery are:: return home   Choice offered to / list presented to : NA  Expected Discharge Plan and Services Expected Discharge Plan: Home/Self Care In-house Referral: NA Discharge Planning Services: CM Consult Post Acute Care Choice: NA Living arrangements for the past 2 months: Single Family Home                 DME Arranged: N/A DME Agency: NA       HH Arranged: NA HH Agency: NA        Prior Living Arrangements/Services Living arrangements for the past 2 months: Single Family Home Lives with:: Self, Spouse   Do you feel safe going back to the place where you live?: Yes          Current home services: DME(has a cane) Criminal Activity/Legal Involvement Pertinent to Current Situation/Hospitalization: No - Comment as needed  Activities of Daily Living      Permission Sought/Granted                  Emotional Assessment   Attitude/Demeanor/Rapport: Engaged Affect (typically observed): Accepting Orientation: : Oriented to Situation, Oriented to  Time, Oriented to  Place, Oriented to Self Alcohol / Substance Use: Not Applicable Psych Involvement: No (comment)  Admission diagnosis:  Febrile illness, acute [R50.9] Patient Active Problem List   Diagnosis Date Noted  . SIRS (systemic inflammatory response syndrome) ([place]) 08/25/2019  . Acute blood loss anemia 06/25/2019  . ESRD (end stage renal disease) ([place]) 06/25/2019  . Complication of dialysis access insertion 06/25/2019  . Acute renal failure superimposed on stage 3 chronic kidney disease ([place]) 04/19/2019  . Chest pain 04/19/2019  . Hypokalemia 04/19/2019  . Normocytic anemia 04/19/2019  . HLD (hyperlipidemia)   . Hypertension    PCP:  Seward Carol, PA-C Pharmacy:   [facility] [place] [address] [place] [address] Phone: [phone] Fax: [phone]     Social Determinants of Health (SDOH) Interventions    Readmission Risk Interventions No flowsheet data found.

## 2019-08-25 NOTE — Progress Notes (Signed)
Pharmacy Antibiotic Note  Jesus Ayala is a 68 y.o. male admitted on 08/24/2019 with sepsis.  Pharmacy has been consulted for vancomycin and cefepime dosing.  Plan: Vancomycin 1250mg  given in ED then 750mg  IV every HD.  Goal pre-HD level 15-25 mcg/mL. Cefepime 2g given in ED then 2g IV MWF.  Height: 5\' 11"  (180.3 cm) Weight: 156 lb 8.4 oz (71 kg) IBW/kg (Calculated) : 75.3  Temp (24hrs), Avg:101.1 F (38.4 C), Min:99.2 F (37.3 C), Max:102.5 F (39.2 C)  Recent Labs  Lab 08/24/19 2012 08/24/19 2013 08/24/19 2305  WBC 7.3  --   --   CREATININE 6.12*  --   --   LATICACIDVEN  --  1.3 1.7    Estimated Creatinine Clearance: 11.8 mL/min (A) (by C-G formula based on SCr of 6.12 mg/dL (H)).    Allergies  Allergen Reactions  . Ace Inhibitors Swelling and Other (See Comments)  . Enalapril Swelling    ANGIOEDEMA of lips  . Iron Sucrose Itching    Thank you for allowing pharmacy to be a part of this patient's care.  Wynona Neat, PharmD, BCPS  08/25/2019 3:42 AM

## 2019-08-26 DIAGNOSIS — Z87891 Personal history of nicotine dependence: Secondary | ICD-10-CM

## 2019-08-26 DIAGNOSIS — Z888 Allergy status to other drugs, medicaments and biological substances status: Secondary | ICD-10-CM

## 2019-08-26 DIAGNOSIS — Z992 Dependence on renal dialysis: Secondary | ICD-10-CM

## 2019-08-26 DIAGNOSIS — R42 Dizziness and giddiness: Secondary | ICD-10-CM

## 2019-08-26 DIAGNOSIS — R000 Tachycardia, unspecified: Secondary | ICD-10-CM

## 2019-08-26 DIAGNOSIS — N186 End stage renal disease: Secondary | ICD-10-CM

## 2019-08-26 DIAGNOSIS — R509 Fever, unspecified: Secondary | ICD-10-CM

## 2019-08-26 DIAGNOSIS — K089 Disorder of teeth and supporting structures, unspecified: Secondary | ICD-10-CM

## 2019-08-26 DIAGNOSIS — R7881 Bacteremia: Secondary | ICD-10-CM

## 2019-08-26 DIAGNOSIS — B954 Other streptococcus as the cause of diseases classified elsewhere: Secondary | ICD-10-CM

## 2019-08-26 LAB — BASIC METABOLIC PANEL
Anion gap: 12 (ref 5–15)
BUN: 44 mg/dL — ABNORMAL HIGH (ref 8–23)
CO2: 28 mmol/L (ref 22–32)
Calcium: 8.8 mg/dL — ABNORMAL LOW (ref 8.9–10.3)
Chloride: 96 mmol/L — ABNORMAL LOW (ref 98–111)
Creatinine, Ser: 9.78 mg/dL — ABNORMAL HIGH (ref 0.61–1.24)
GFR calc Af Amer: 6 mL/min — ABNORMAL LOW (ref >60)
GFR calc non Af Amer: 5 mL/min — ABNORMAL LOW (ref >60)
Glucose, Bld: 95 mg/dL (ref 70–99)
Potassium: 4.1 mmol/L (ref 3.5–5.1)
Sodium: 136 mmol/L (ref 135–145)

## 2019-08-26 LAB — CBC
HCT: 29.1 % — ABNORMAL LOW (ref 39.0–52.0)
Hemoglobin: 9.9 g/dL — ABNORMAL LOW (ref 13.0–17.0)
MCH: 30.4 pg (ref 26.0–34.0)
MCHC: 34 g/dL (ref 30.0–36.0)
MCV: 89.3 fL (ref 80.0–100.0)
Platelets: 170 10*3/uL (ref 150–400)
RBC: 3.26 MIL/uL — ABNORMAL LOW (ref 4.22–5.81)
RDW: 16.5 % — ABNORMAL HIGH (ref 11.5–15.5)
WBC: 5.7 10*3/uL (ref 4.0–10.5)
nRBC: 0 % (ref 0.0–0.2)

## 2019-08-26 LAB — PROCALCITONIN: Procalcitonin: 6.94 ng/mL

## 2019-08-26 MED ORDER — AMLODIPINE BESYLATE 10 MG PO TABS
10.0000 mg | ORAL_TABLET | Freq: Every day | ORAL | Status: DC
  Administered 2019-08-26 – 2019-09-01 (×6): 10 mg via ORAL
  Filled 2019-08-26 (×7): qty 1

## 2019-08-26 MED ORDER — HYDRALAZINE HCL 25 MG PO TABS
25.0000 mg | ORAL_TABLET | Freq: Three times a day (TID) | ORAL | Status: DC
  Administered 2019-08-26 – 2019-09-01 (×15): 25 mg via ORAL
  Filled 2019-08-26 (×16): qty 1

## 2019-08-26 MED ORDER — CHLORHEXIDINE GLUCONATE CLOTH 2 % EX PADS
6.0000 | MEDICATED_PAD | Freq: Every day | CUTANEOUS | Status: DC
  Administered 2019-08-26 – 2019-08-27 (×2): 6 via TOPICAL

## 2019-08-26 MED ORDER — FUROSEMIDE 80 MG PO TABS
80.0000 mg | ORAL_TABLET | Freq: Every day | ORAL | Status: DC
  Administered 2019-08-26 – 2019-09-01 (×6): 80 mg via ORAL
  Filled 2019-08-26 (×6): qty 1

## 2019-08-26 MED ORDER — SODIUM CHLORIDE 0.9 % IV SOLN
2.0000 g | Freq: Every day | INTRAVENOUS | Status: DC
  Administered 2019-08-26: 22:00:00 2 g via INTRAVENOUS
  Filled 2019-08-26: qty 20
  Filled 2019-08-26: qty 2

## 2019-08-26 MED ORDER — ASPIRIN 81 MG PO CHEW
81.0000 mg | CHEWABLE_TABLET | Freq: Every day | ORAL | Status: DC
  Administered 2019-08-26 – 2019-09-01 (×7): 81 mg via ORAL
  Filled 2019-08-26 (×7): qty 1

## 2019-08-26 MED ORDER — METOPROLOL TARTRATE 100 MG PO TABS
100.0000 mg | ORAL_TABLET | Freq: Two times a day (BID) | ORAL | Status: DC
  Administered 2019-08-26 – 2019-09-01 (×11): 100 mg via ORAL
  Filled 2019-08-26 (×12): qty 1

## 2019-08-26 MED ORDER — NITROGLYCERIN 0.4 MG SL SUBL: 0.4000 mg | SUBLINGUAL_TABLET | SUBLINGUAL | Status: DC | PRN

## 2019-08-26 NOTE — Progress Notes (Signed)
PROGRESS NOTE    Patient: Jesus Ayala                            PCP: Seward Carol, PA-C                    DOB: 1950-12-02            DOA: 08/24/2019 [place]:[id number]             DOS: 08/26/2019, 1:34 PM   LOS: 1 day   Date of Service: The patient was seen and examined on 08/26/2019  Subjective:   The patient was seen and examined this morning, stable no acute distress. Denies have having any fever  Or chills this AM, Denies any shortness or chest pain at this time. T-max 100.4. Patient was notified of 1 out of 2 blood cultures was growing Streptococcus But does   Brief Narrative:   Jesus Ayala is a 68 y.o. male with a PMH of ESRD on dialysis, anemia, HTN and HLD who presented to the ED on 08/24/2019 with dizziness and fever. Patient reports he finished his dialysis treatment on Monday and felt fine, but developed dizziness and heart palpitations about 30 minutes after he got home. Reports his temp was 102F. He denied any respiratory symptoms on admission and COVID was negative. In the ED, T 102.33F, Pulse 113, BP 164/71. K+ 4.1, BUN 28, Cr 8.15, Hgb 9.4>>10.2 on 08/25/2019. Chest c-ray with no active disease. Patient was started on vancomycin and zosyn. Blood cultures were positive for streptococcus. Antibiotics were narrowed from vancomycin/cefepime/flagyl to ceftriaxone. Patient does have a [place] and a LUE AVG placed 07/14/2019. Per [facility] notes, he did have a possible Gore reaction and recommended waiting until 08/31/2019 before accessing graft, however patient reports his graft has been cannulated 3 times successfully.  He reports no pain, bleeding or drainage from [facility]   Assessment & Plan:   Active Problems:   HLD (hyperlipidemia)   Hypertension   Normocytic anemia   ESRD (end stage renal disease) (HCC)   SIRS (systemic inflammatory response syndrome) (HCC)  SIRS /Fever/possible bacteremia -Present on admission temperature of 102 , T-max 100.4 -08/25/2019 1 out of 2 blood  culture growing Streptococcus,  -Repeated blood cultures negative to date -No signs of sepsis, remains afebrile normotensive -On admission patient was started on vancomycin / cefepime /Flagyl >>> antibiotic narrowed down to ceftriaxone IV  -No source of infection at this time identified -The patient has AV fistula left upper arm -no signs of infection - Port cath right chest area -signs of infection erythema edema or drainage Dissipating for to be DC'd  End-stage renal disease -Hemodialysis days M/WF -Patient started on hemodialysis in September 2020, status post left upper extremity AV fistula placement on 07/14/2019, -Nephrologist consulted, due for hemodialysis today  History of chronic anemia due to chronic disease, end-stage renal disease -H&H stable -Patient has had transfusion in the past due to bleeding from catheter site.   Hyperlipidemia -Continue statins  Hypertension -As patient improves, continue home medication of Lopressor, hydralazine, Norvasc, -Lasix  ???  AV graft infection -Status post left FA loop graft by Dr. Doren Custard 07/14/2019 -TCD placement on 06/25/2019 by Dr. Donzetta Matters  -No signs of overt infection at this time monitoring closely -Appreciate vascular team input   Cultures; 08/25/2019 blood cultures >> Streptococcus Repeat blood cultures 08/25/2019 >>  Antimicrobials: Vancomycin/cefepime/Flagyl >>> 08/25/2019.  08/25/19 IV Rocephin>>  DVT prophylaxis: SCD/Compression stockings Code Status:   Code Status: Prior Family Communication: No family member present at bedside- attempt will be made to update daily The above findings and plan of care has been discussed with patient in detail,  they expressed understanding and agreement of above. Disposition Plan:   Anticipated 1-2 days Admission status:  NPATIEN -   Consultants: Nephrology  Procedures:   No admission procedures for hospital encounter.   Antimicrobials:  Anti-infectives (From admission, onward)    Start     Dose/Rate Route Frequency Ordered Stop   08/26/19 2200  cefTRIAXone (ROCEPHIN) 1 g in sodium chloride 0.9 % 100 mL IVPB  Status:  Discontinued     1 g 200 mL/hr over 30 Minutes Intravenous Daily at bedtime 08/25/19 1454 08/26/19 1202   08/26/19 2200  cefTRIAXone (ROCEPHIN) 2 g in sodium chloride 0.9 % 100 mL IVPB     2 g 200 mL/hr over 30 Minutes Intravenous Daily at bedtime 08/26/19 1202     08/26/19 2000  ceFEPIme (MAXIPIME) 2 g in sodium chloride 0.9 % 100 mL IVPB  Status:  Discontinued     2 g 200 mL/hr over 30 Minutes Intravenous Every M-W-F (2000) 08/25/19 0346 08/25/19 1445   08/26/19 1200  vancomycin (VANCOCIN) IVPB 750 mg/150 ml premix  Status:  Discontinued     750 mg 150 mL/hr over 60 Minutes Intravenous Every M-W-F (Hemodialysis) 08/25/19 0346 08/25/19 1445   08/25/19 1600  cefTRIAXone (ROCEPHIN) 1 g in dextrose 5 % 50 mL IVPB  Status:  Discontinued     1 g 120 mL/hr over 30 Minutes Intravenous Every 24 hours 08/25/19 1445 08/25/19 1454   08/25/19 1600  cefTRIAXone (ROCEPHIN) 2 g in sodium chloride 0.9 % 100 mL IVPB     2 g 200 mL/hr over 30 Minutes Intravenous  Once 08/25/19 1454 08/25/19 1903   08/25/19 0800  metroNIDAZOLE (FLAGYL) IVPB 500 mg  Status:  Discontinued     500 mg 100 mL/hr over 60 Minutes Intravenous Every 8 hours 08/25/19 0337 08/25/19 1445   08/24/19 2345  piperacillin-tazobactam (ZOSYN) IVPB 3.375 g     3.375 g 100 mL/hr over 30 Minutes Intravenous  Once 08/24/19 2330 08/25/19 0119   08/24/19 2330  vancomycin (VANCOCIN) 1,250 mg in sodium chloride 0.9 % 250 mL IVPB     1,250 mg 166.7 mL/hr over 90 Minutes Intravenous  Once 08/24/19 2329 08/25/19 0150   08/24/19 2330  piperacillin-tazobactam (ZOSYN) IVPB 4.5 g  Status:  Discontinued     4.5 g 133.3 mL/hr over 30 Minutes Intravenous  Once 08/24/19 2329 08/24/19 2330       Medication:  . calcitRIOL  0.25 mcg Oral Daily  . calcium acetate  1,334 mg Oral TID WC  . Chlorhexidine Gluconate  Cloth  6 each Topical Q0600  . heparin  5,000 Units Subcutaneous Q8H  . hydrocortisone  1 application Rectal BID  . latanoprost  1 drop Both Eyes QHS  . traZODone  50 mg Oral QHS    acetaminophen, ondansetron (ZOFRAN) IV   Objective:   Vitals:   08/26/19 1200 08/26/19 1209 08/26/19 1230 08/26/19 1330  BP: (!) 142/79 (!) 150/87 135/64 (!) 142/80  Pulse: 75 79 87 84  Resp: 20     Temp: 98.1 F (36.7 C)     TempSrc: Oral     SpO2: 98%     Weight: 72.7 kg     Height:        Intake/Output Summary (  Last 24 hours) at 08/26/2019 1334 Last data filed at 08/25/2019 1900 Gross per 24 hour  Intake 200 ml  Output -  Net 200 ml   Filed Weights   08/25/19 0141 08/26/19 0556 08/26/19 1200  Weight: 71 kg 72.2 kg 72.7 kg     Examination:   Physical Exam  Constitution:  Alert, cooperative, no distress,  Appears calm and comfortable  Psychiatric: Normal and stable mood and affect, cognition intact,   HEENT: Normocephalic, PERRL, otherwise with in Normal limits  Chest:Chest symmetric Cardio vascular:  S1/S2, RRR, No murmure, No Rubs or Gallops  pulmonary: Clear to auscultation bilaterally, respirations unlabored, negative wheezes / crackles Abdomen: Soft, non-tender, non-distended, bowel sounds,no masses, no organomegaly Muscular skeletal: Limited exam - in bed, able to move all 4 extremities, Normal strength,  Neuro: CNII-XII intact. , normal motor and sensation, reflexes intact  Extremities: No pitting edema lower extremities, +2 pulses  Skin: Dry, warm to touch, negative for any Rashes, No open wounds Wounds: Negative for any open wounds no facial rash, right chest port catheter, left upper extremity AV fistula with a gauze wrap no signs of erythema edema or drainage  LABs:  CBC Latest Ref Rng & Units 08/25/2019 08/24/2019 07/24/2019  WBC 4.0 - 10.5 K/uL 8.9 7.3 8.7  Hemoglobin 13.0 - 17.0 g/dL 10.2(L) 10.8(L) 9.4(L)  Hematocrit 39.0 - 52.0 % 31.3(L) 32.8(L) 28.2(L)  Platelets 150  - 400 K/uL 163 181 272   CMP Latest Ref Rng & Units 08/26/2019 08/25/2019 08/24/2019  Glucose 70 - 99 mg/dL 95 93 122(H)  BUN 8 - 23 mg/dL 44(H) 21 16  Creatinine 0.61 - 1.24 mg/dL 9.78(H) 7.48(H) 6.12(H)  Sodium 135 - 145 mmol/L 136 137 137  Potassium 3.5 - 5.1 mmol/L 4.1 4.0 3.8  Chloride 98 - 111 mmol/L 96(L) 98 96(L)  CO2 22 - 32 mmol/L 28 27 29   Calcium 8.9 - 10.3 mg/dL 8.8(L) 8.7(L) 9.2  Total Protein 6.5 - 8.1 g/dL - - 7.5  Total Bilirubin 0.3 - 1.2 mg/dL - - 0.5  Alkaline Phos 38 - 126 U/L - - 54  AST 15 - 41 U/L - - 16  ALT 0 - 44 U/L - - 17        SIGNED: Deatra James, MD, FACP, FHM. [facility],  Pager [phone]  If 7PM-7AM, please contact night-coverage [email or web address] Ota [facility] 08/26/2019, 1:34 PM

## 2019-08-26 NOTE — Procedures (Signed)
I was present at this dialysis session. I have reviewed the session itself and made appropriate changes.   Vital signs in last 24 hours:  Temp:  [98.1 F (36.7 C)-98.4 F (36.9 C)] 98.1 F (36.7 C) (11/04 1200) Pulse Rate:  [75-95] 87 (11/04 1230) Resp:  [12-25] 20 (11/04 1200) BP: (125-150)/(64-87) 135/64 (11/04 1230) SpO2:  [98 %-100 %] 98 % (11/04 1200) Weight:  [72.2 kg-72.7 kg] 72.7 kg (11/04 1200) Weight change: -0.862 kg Filed Weights   08/25/19 0141 08/26/19 0556 08/26/19 1200  Weight: 71 kg 72.2 kg 72.7 kg    Recent Labs  Lab 08/26/19 0228  NA 136  K 4.1  CL 96*  CO2 28  GLUCOSE 95  BUN 44*  CREATININE 9.78*  CALCIUM 8.8*    Recent Labs  Lab 08/24/19 2012 08/25/19 0737  WBC 7.3 8.9  NEUTROABS 6.3  --   HGB 10.8* 10.2*  HCT 32.8* 31.3*  MCV 89.6 90.5  PLT 181 163    Scheduled Meds: . calcitRIOL  0.25 mcg Oral Daily  . calcium acetate  1,334 mg Oral TID WC  . Chlorhexidine Gluconate Cloth  6 each Topical Q0600  . heparin  5,000 Units Subcutaneous Q8H  . hydrocortisone  1 application Rectal BID  . latanoprost  1 drop Both Eyes QHS  . traZODone  50 mg Oral QHS   Continuous Infusions: . cefTRIAXone (ROCEPHIN)  IV     PRN Meds:.acetaminophen, ondansetron (ZOFRAN) IV   Donetta Potts,  MD 08/26/2019, 12:54 PM

## 2019-08-26 NOTE — Consult Note (Addendum)
Hospital Consult    Reason for Consult:  ? Infected AVG left arm Requesting Physician:  Theda Sers, [place] MRN #:  [id number]  History of Present Illness: This is a 68 y.o. male who underwent left FA loop graft by Dr. Scot Dock on 07/14/2019 and [facility] placement on 06/25/2019 by Dr. Donzetta Matters.   He was seen back in the office one week after his graft placement with c/o swelling in the forearm that he felt had gotten a little bit better.  He did not have steal sx.  The swelling was most likely due to tunneling of the graft.    He was admitted to the hospital yesterday with c/o fever and not feeling well.  He states he ate and laid down and started to feel foggy.  He had fever and called 911.   He had a negative covid test.   He states the swelling in left forearm improved.  He states they have used his graft three times without difficulty.  He states it is not painful, there has been no drainage.  He states his last temp was normal.     Past Medical History:  Diagnosis Date  . Anemia   . Anxiety    situational  . ESRD (end stage renal disease) ([place])    MWF- [place]  . HLD (hyperlipidemia)   . Hypertension     Past Surgical History:  Procedure Laterality Date  . AV FISTULA PLACEMENT Left 07/14/2019   Procedure: INSERTION OF ARTERIOVENOUS (AV) GORE-TEX GRAFT ARM;  Surgeon: Angelia Mould, MD;  Location: [place];  Service: Vascular;  Laterality: Left;  . COLONOSCOPY    . HERNIA REPAIR     unbicial  . INSERTION OF DIALYSIS CATHETER Right 06/25/2019   Procedure: INSERTION OF TUNNEL DIALYSIS CATHETER;  Surgeon: Waynetta Sandy, MD;  Location: [place];  Service: Vascular;  Laterality: Right;    Allergies  Allergen Reactions  . Ace Inhibitors Swelling and Other (See Comments)  . Enalapril Swelling    ANGIOEDEMA of lips  . Iron Sucrose Itching    Prior to Admission medications   Medication Sig Start Date End Date Taking? Authorizing Provider  acetaminophen (TYLENOL) 500 MG  tablet Take 500-1,000 mg by mouth every 6 (six) hours as needed (pain).   Yes [provider]  amLODipine (NORVASC) 10 MG tablet Take 10 mg by mouth daily. 04/12/19  Yes [provider]  aspirin 81 MG chewable tablet Chew 81 mg by mouth daily.   Yes [provider]  B Complex-C-Zn-Folic Acid (DIALYVITE AB-123456789 15 PO) Take 1 tablet by mouth daily at 3 pm.   Yes [provider]  calcitRIOL (ROCALTROL) 0.25 MCG capsule Take 0.25 mcg by mouth daily. 06/12/19  Yes [provider]  calcium acetate (PHOSLO) 667 MG capsule Take 1 capsule (667 mg total) by mouth 3 (three) times daily with meals. Patient taking differently: Take 1,334 mg by mouth 3 (three) times daily with meals.  04/22/19  Yes Lavina Hamman, MD  furosemide (LASIX) 80 MG tablet Take 80 mg by mouth daily. 01/22/19  Yes [provider]  hydrALAZINE (APRESOLINE) 25 MG tablet Take 1 tablet (25 mg total) by mouth 3 (three) times daily. 04/22/19  Yes Lavina Hamman, MD  hydrocortisone (ANUSOL-HC) 2.5 % rectal cream Place 1 application rectally 2 (two) times daily. 07/24/19  Yes Deno Etienne, DO  latanoprost (XALATAN) 0.005 % ophthalmic solution Place 1 drop into both eyes at bedtime.   Yes [provider]  metoprolol tartrate (LOPRESSOR) 100 MG tablet Take 100 mg by mouth 2 (two) times a day. 04/12/19  Yes [provider]  nitroGLYCERIN (NITROSTAT) 0.4 MG SL tablet Place 0.4 mg under the tongue every 5 (five) minutes as needed for chest pain.  03/27/19  Yes [provider]  oxyCODONE-acetaminophen (PERCOCET) 5-325 MG tablet Take 1 tablet by mouth every 6 (six) hours as needed. Patient taking differently: Take 1 tablet by mouth every 6 (six) hours as needed for moderate pain.  07/21/19  Yes Rhyne, Hulen Shouts, PA-C  traZODone (DESYREL) 50 MG tablet Take 50 mg by mouth at bedtime.   Yes [provider]  hydrocortisone (ANUSOL-HC) 25 MG suppository Place 1 suppository (25 mg  total) rectally 2 (two) times daily. For 7 days Patient not taking: Reported on 08/25/2019 07/24/19   Deno Etienne, DO    Social History   Socioeconomic History  . Marital status: Married    Spouse name: Not on file  . Number of children: Not on file  . Years of education: Not on file  . Highest education level: Not on file  Occupational History  . Not on file  Social Needs  . Financial resource strain: Not on file  . Food insecurity    Worry: Not on file    Inability: Not on file  . Transportation needs    Medical: Not on file    Non-medical: Not on file  Tobacco Use  . Smoking status: Former Smoker    Years: 2.00    Quit date: 1987    Years since quitting: 33.8  . Smokeless tobacco: Never Used  Substance and Sexual Activity  . Alcohol use: Not Currently  . Drug use: No  . Sexual activity: Not on file  Lifestyle  . Physical activity    Days per week: Not on file    Minutes per session: Not on file  . Stress: Not on file  Relationships  . Social Herbalist on phone: Not on file    Gets together: Not on file    Attends religious service: Not on file    Active member of club or organization: Not on file    Attends meetings of clubs or organizations: Not on file    Relationship status: Not on file  . Intimate partner violence    Fear of current or ex partner: Not on file    Emotionally abused: Not on file    Physically abused: Not on file    Forced sexual activity: Not on file  Other Topics Concern  . Not on file  Social History Narrative  . Not on file     Family History  Problem Relation Age of Onset  . Lung cancer Father   . Hypertension Brother     ROS: [x]  Positive   [ ]  Negative   [ ]  All sytems reviewed and are negative See HPI   Physical Examination  Vitals:   08/26/19 0855 08/26/19 0856  BP: 134/70   Pulse:  87  Resp: 16 12  Temp:    SpO2:  100%   Body mass index is 22.19 kg/m.  General:  WDWN in NAD sitting in chair Gait:  Not observed HENT: WNL, normocephalic Pulmonary: normal non-labored breathing Cardiac: regular Vascular Exam/Pulses: Left FA AVG with excellent thrill.  There is no erythema.  Incisions look fine without any drainage.  There is mild warmth around the distal portion of the graft.  Extremities:  palpable left radial pulse.  Strong hand grip.  Motor and sensation are in tact left hand.  Musculoskeletal: no muscle wasting or atrophy  Neurologic: A&O X 3;  No focal weakness or paresthesias are detected; speech is fluent/normal Psychiatric:  The pt has Normal affect.  CBC    Component Value Date/Time   WBC 8.9 08/25/2019 0737   RBC 3.46 (L) 08/25/2019 0737   HGB 10.2 (L) 08/25/2019 0737   HCT 31.3 (L) 08/25/2019 0737   PLT 163 08/25/2019 0737   MCV 90.5 08/25/2019 0737   MCH 29.5 08/25/2019 0737   MCHC 32.6 08/25/2019 0737   RDW 17.1 (H) 08/25/2019 0737   LYMPHSABS 0.4 (L) 08/24/2019 2012   MONOABS 0.5 08/24/2019 2012   EOSABS 0.0 08/24/2019 2012   BASOSABS 0.0 08/24/2019 2012    BMET    Component Value Date/Time   NA 136 08/26/2019 0228   K 4.1 08/26/2019 0228   CL 96 (L) 08/26/2019 0228   CO2 28 08/26/2019 0228   GLUCOSE 95 08/26/2019 0228   BUN 44 (H) 08/26/2019 0228   CREATININE 9.78 (H) 08/26/2019 0228   CALCIUM 8.8 (L) 08/26/2019 0228   CALCIUM 8.7 04/21/2019 0646   GFRNONAA 5 (L) 08/26/2019 0228   GFRAA 6 (L) 08/26/2019 0228    COAGS: Lab Results  Component Value Date   INR 1.1 08/25/2019   INR 1.1 06/25/2019   INR 1.0 04/19/2019      ASSESSMENT/PLAN: This is a 68 y.o. male who is s/p left FA loop graft by Dr. Scot Dock on 07/14/2019 and [facility] placement on 06/25/2019 by Dr. Donzetta Matters.   -left FA loop graft looks good without erythema or pain.  There is mild warmth distally around the graft. There is no fluctuance around the graft.  Most likely would not get duplex at this time as there is probably still small amount of fluid from tunneling present and there is no  fluctuance.   -would probably favor giving the graft a few days of observation and removing the catheter at this time.   If graft becomes overtly infected, can replace catheter and remove graft later.  -blood cx pending-pt on Rocephin  -Dr. Donnetta Hutching to see pt later this morning.    Leontine Locket, PA-C [facility] [phone]  I have examined the patient, reviewed and agree with above.  Seen in hemodialysis.  Currently undergoing hemodialysis via his left forearm loop graft.  Graft has no tenderness.  I do not appreciate any erythema.  The technician reports no difficulty in cannulization.  I feel that it is safe to remove his catheter.  We will coordinate this while inpatient  Curt Jews, MD 08/26/2019 3:58 PM

## 2019-08-26 NOTE — Consult Note (Signed)
[facility]       Reason for Consult: bacteremia    Referring Physician: Dr. Roger Shelter  Active Problems:   HLD (hyperlipidemia)   Hypertension   Normocytic anemia   ESRD (end stage renal disease) (HCC)   SIRS (systemic inflammatory response syndrome) ([place])   . calcitRIOL  0.25 mcg Oral Daily  . calcium acetate  1,334 mg Oral TID WC  . Chlorhexidine Gluconate Cloth  6 each Topical Q0600  . heparin  5,000 Units Subcutaneous Q8H  . hydrocortisone  1 application Rectal BID  . latanoprost  1 drop Both Eyes QHS  . traZODone  50 mg Oral QHS    Recommendations: Ceftriaxone Repeat blood cultures  TTE  Assessment: He came in with dizziness, tachycardia and fever up to 102.5 on admission and now with 2 sets of blood cultures with Strep mitis.  He does have tooth issues, fistula appears ok.  Other potential source could be endocarditis.    Antibiotics: ceftriaxone  HPI: Jesus Ayala is a 68 y.o. male with ESRD on IHD since 06/2019 came in with dizziness, fever and now with bacteremia.  Initially started on broad spectrum antibiotics and now with Strep mitis and on ceftiraxone.  He feels better since admission.  No obvious source though has some bothersome teeth.  Using fistula now and evaluated by Dr. Donnetta Hutching and no infectious concerns.  TDC to be removed.  He has been having chills.  No associated rash.  WBC wnl.    Review of Systems:  Constitutional: negative for fevers and chills Gastrointestinal: negative for nausea and diarrhea Hematologic/lymphatic: negative for lymphadenopathy All other systems reviewed and are negative    Past Medical History:  Diagnosis Date  . Anemia   . Anxiety    situational  . ESRD (end stage renal disease) ([place])    MWF- [facility]  . HLD (hyperlipidemia)   . Hypertension     Social History   Tobacco Use  . Smoking status: Former Smoker    Years: 2.00    Quit date: 1987    Years since quitting:  33.8  . Smokeless tobacco: Never Used  Substance Use Topics  . Alcohol use: Not Currently  . Drug use: No    Family History  Problem Relation Age of Onset  . Lung cancer Father   . Hypertension Brother     Allergies  Allergen Reactions  . Ace Inhibitors Swelling and Other (See Comments)  . Enalapril Swelling    ANGIOEDEMA of lips  . Iron Sucrose Itching    Physical Exam: Constitutional: in no apparent distress  Vitals:   08/26/19 1530 08/26/19 1600  BP: (!) 145/86 (!) (P) 142/86  Pulse: 90 (P) 93  Resp:    Temp:    SpO2:     EYES: anicteric Cardiovascular: Cor RRR Respiratory: CTA B; normal respiratory effort GI: Bowel sounds are normal, liver is not enlarged, spleen is not enlarged Musculoskeletal: no pedal edema noted; left arm wrapped Skin: negatives: no rash Neuro: non-focal  Lab Results  Component Value Date   WBC 5.7 08/26/2019   HGB 9.9 (L) 08/26/2019   HCT 29.1 (L) 08/26/2019   MCV 89.3 08/26/2019   PLT 170 08/26/2019    Lab Results  Component Value Date   CREATININE 9.78 (H) 08/26/2019   BUN 44 (H) 08/26/2019   NA 136 08/26/2019   K 4.1 08/26/2019   CL 96 (L) 08/26/2019   CO2 28 08/26/2019  Lab Results  Component Value Date   ALT 17 08/24/2019   AST 16 08/24/2019   ALKPHOS 54 08/24/2019     Microbiology: Recent Results (from the past 240 hour(s))  Blood culture (routine x 2)     Status: Abnormal (Preliminary result)   Collection Time: 08/24/19  8:17 PM   Specimen: BLOOD RIGHT HAND  Result Value Ref Range Status   Specimen Description BLOOD RIGHT HAND  Final   Special Requests   Final    BOTTLES DRAWN AEROBIC AND ANAEROBIC Blood Culture results may not be optimal due to an excessive volume of blood received in culture bottles   Culture  Setup Time   Final    GRAM POSITIVE COCCI IN CHAINS IN BOTH AEROBIC AND ANAEROBIC BOTTLES Performed at [facility], [place] [address], [place], [place] [address]    Culture STREPTOCOCCUS  MITIS/ORALIS (A)  Final   Report Status PENDING  Incomplete  Blood culture (routine x 2)     Status: Abnormal (Preliminary result)   Collection Time: 08/24/19  8:17 PM   Specimen: BLOOD RIGHT FOREARM  Result Value Ref Range Status   Specimen Description BLOOD RIGHT FOREARM  Final   Special Requests   Final    BOTTLES DRAWN AEROBIC AND ANAEROBIC Blood Culture adequate volume   Culture  Setup Time   Final    GRAM POSITIVE COCCI IN CHAINS IN BOTH AEROBIC AND ANAEROBIC BOTTLES CRITICAL RESULT CALLED TO, READ BACK BY AND VERIFIED WITH: C. WALSTON PHARMD, AT 2130 08/25/19 BY D. VANHOOK CRITICAL RESULT CALLED TO, READ BACK BY AND VERIFIED WITH: C. WALSTON, PHARMD AT 1300 ON 08/25/19 BY C. JESSUP, MT. Performed at [facility], [place] [address], [place], [place] [address]    Culture STREPTOCOCCUS MITIS/ORALIS (A)  Final   Report Status PENDING  Incomplete  Blood Culture ID Panel (Reflexed)     Status: Abnormal   Collection Time: 08/24/19  8:17 PM  Result Value Ref Range Status   Enterococcus species NOT DETECTED NOT DETECTED Final   Listeria monocytogenes NOT DETECTED NOT DETECTED Final   Staphylococcus species NOT DETECTED NOT DETECTED Final   Staphylococcus aureus (BCID) NOT DETECTED NOT DETECTED Final   Streptococcus species DETECTED (A) NOT DETECTED Final    Comment: Not Enterococcus species, Streptococcus agalactiae, Streptococcus pyogenes, or Streptococcus pneumoniae. CRITICAL RESULT CALLED TO, READ BACK BY AND VERIFIED WITH: C. WALSTON, PHARMD AT 1300 ON 08/25/19 BY C. JESSUP, MT.    Streptococcus agalactiae NOT DETECTED NOT DETECTED Final   Streptococcus pneumoniae NOT DETECTED NOT DETECTED Final   Streptococcus pyogenes NOT DETECTED NOT DETECTED Final   Acinetobacter baumannii NOT DETECTED NOT DETECTED Final   Enterobacteriaceae species NOT DETECTED NOT DETECTED Final   Enterobacter cloacae complex NOT DETECTED NOT DETECTED Final   Escherichia coli NOT DETECTED NOT DETECTED  Final   Klebsiella oxytoca NOT DETECTED NOT DETECTED Final   Klebsiella pneumoniae NOT DETECTED NOT DETECTED Final   Proteus species NOT DETECTED NOT DETECTED Final   Serratia marcescens NOT DETECTED NOT DETECTED Final   Haemophilus influenzae NOT DETECTED NOT DETECTED Final   Neisseria meningitidis NOT DETECTED NOT DETECTED Final   Pseudomonas aeruginosa NOT DETECTED NOT DETECTED Final   Candida albicans NOT DETECTED NOT DETECTED Final   Candida glabrata NOT DETECTED NOT DETECTED Final   Candida krusei NOT DETECTED NOT DETECTED Final   Candida parapsilosis NOT DETECTED NOT DETECTED Final   Candida tropicalis NOT DETECTED NOT DETECTED Final  Comment: Performed at [facility], [place] [address], [place], [place] [address]  SARS Coronavirus 2 by RT PCR (hospital order, performed in [facility] hospital lab) Nasopharyngeal Nasopharyngeal Swab     Status: None   Collection Time: 08/24/19  9:46 PM   Specimen: Nasopharyngeal Swab  Result Value Ref Range Status   SARS Coronavirus 2 NEGATIVE NEGATIVE Final    Comment: (NOTE) If result is NEGATIVE SARS-CoV-2 target nucleic acids are NOT DETECTED. The SARS-CoV-2 RNA is generally detectable in upper and lower  respiratory specimens during the acute phase of infection. The lowest  concentration of SARS-CoV-2 viral copies this assay can detect is 250  copies / mL. A negative result does not preclude SARS-CoV-2 infection  and should not be used as the sole basis for treatment or other  patient management decisions.  A negative result may occur with  improper specimen collection / handling, submission of specimen other  than nasopharyngeal swab, presence of viral mutation(s) within the  areas targeted by this assay, and inadequate number of viral copies  (<250 copies / mL). A negative result must be combined with clinical  observations, patient history, and epidemiological information. If result is POSITIVE SARS-CoV-2 target nucleic acids  are DETECTED. The SARS-CoV-2 RNA is generally detectable in upper and lower  respiratory specimens dur ing the acute phase of infection.  Positive  results are indicative of active infection with SARS-CoV-2.  Clinical  correlation with patient history and other diagnostic information is  necessary to determine patient infection status.  Positive results do  not rule out bacterial infection or co-infection with other viruses. If result is PRESUMPTIVE POSTIVE SARS-CoV-2 nucleic acids MAY BE PRESENT.   A presumptive positive result was obtained on the submitted specimen  and confirmed on repeat testing.  While 2019 novel coronavirus  (SARS-CoV-2) nucleic acids may be present in the submitted sample  additional confirmatory testing may be necessary for epidemiological  and / or clinical management purposes  to differentiate between  SARS-CoV-2 and other Sarbecovirus currently known to infect humans.  If clinically indicated additional testing with an alternate test  methodology [phone]) is advised. The SARS-CoV-2 RNA is generally  detectable in upper and lower respiratory sp ecimens during the acute  phase of infection. The expected result is Negative. Fact Sheet for Patients:  StrictlyIdeas.no Fact Sheet for Healthcare Providers: BankingDealers.co.za This test is not yet approved or cleared by the [place] FDA and has been authorized for detection and/or diagnosis of SARS-CoV-2 by FDA under an Emergency Use Authorization (EUA).  This EUA will remain in effect (meaning this test can be used) for the duration of the COVID-19 declaration under Section 564(b)(1) of the Act, 21 U.S.C. section 360bbb-3(b)(1), unless the authorization is terminated or revoked sooner. Performed at [facility], [place] [address] [place], [place] [address]   MRSA PCR Screening     Status: None   Collection Time: 08/25/19  2:11 AM  Result Value Ref Range  Status   MRSA by PCR NEGATIVE NEGATIVE Final    Comment:        The GeneXpert MRSA Assay (FDA approved for NASAL specimens only), is one component of a comprehensive MRSA colonization surveillance program. It is not intended to diagnose MRSA infection nor to guide or monitor treatment for MRSA infections. Performed at [facility], [place] [address], [place],  [address]   Respiratory Panel by PCR     Status: None   Collection Time:  08/25/19 10:00 AM   Specimen: Flu Kit Nasopharyngeal Swab; Respiratory  Result Value Ref Range Status   Adenovirus NOT DETECTED NOT DETECTED Final   Coronavirus 229E NOT DETECTED NOT DETECTED Final    Comment: (NOTE) The Coronavirus on the Respiratory Panel, DOES NOT test for the novel  Coronavirus (2019 nCoV)    Coronavirus HKU1 NOT DETECTED NOT DETECTED Final   Coronavirus NL63 NOT DETECTED NOT DETECTED Final   Coronavirus OC43 NOT DETECTED NOT DETECTED Final   Metapneumovirus NOT DETECTED NOT DETECTED Final   Rhinovirus / Enterovirus NOT DETECTED NOT DETECTED Final   Influenza A NOT DETECTED NOT DETECTED Final   Influenza B NOT DETECTED NOT DETECTED Final   Parainfluenza Virus 1 NOT DETECTED NOT DETECTED Final   Parainfluenza Virus 2 NOT DETECTED NOT DETECTED Final   Parainfluenza Virus 3 NOT DETECTED NOT DETECTED Final   Parainfluenza Virus 4 NOT DETECTED NOT DETECTED Final   Respiratory Syncytial Virus NOT DETECTED NOT DETECTED Final   Bordetella pertussis NOT DETECTED NOT DETECTED Final   Chlamydophila pneumoniae NOT DETECTED NOT DETECTED Final   Mycoplasma pneumoniae NOT DETECTED NOT DETECTED Final    Comment: Performed at [facility], [place]. [address] [place], [place] [address]  Culture, blood (routine x 2)     Status: None (Preliminary result)   Collection Time: 08/25/19  7:20 PM   Specimen: BLOOD RIGHT HAND  Result Value Ref Range Status   Specimen Description BLOOD RIGHT HAND  Final   Special Requests IN PEDIATRIC  BOTTLE Blood Culture adequate volume  Final   Culture   Final    NO GROWTH < 24 HOURS Performed at [facility], [place] [address], [place], [place] [address]    Report Status PENDING  Incomplete  Culture, blood (routine x 2)     Status: None (Preliminary result)   Collection Time: 08/25/19  7:25 PM   Specimen: BLOOD RIGHT HAND  Result Value Ref Range Status   Specimen Description BLOOD RIGHT HAND  Final   Special Requests   Final    BOTTLES DRAWN AEROBIC ONLY Blood Culture adequate volume   Culture   Final    NO GROWTH < 24 HOURS Performed at [facility], [place] [address], [place], [place] [address]    Report Status PENDING  Incomplete    Thayer Headings, [facility] Infectious Disease [place] [email or web address] 08/26/2019, 4:51 PM

## 2019-08-26 NOTE — Progress Notes (Addendum)
[facility] Renal Consultation Note    Indication for Consultation:  Management of ESRD/hemodialysis, anemia, hypertension/volume, and secondary hyperparathyroidism. PCP:  HPI: Jesus Ayala is a 68 y.o. male with a PMH of ESRD on dialysis, anemia, HTN and HLD who presented to the ED on 08/24/2019 with dizziness and fever. Patient reports he finished his dialysis treatment on Monday and felt fine, but developed dizziness and heart palpitations about 30 minutes after he got home. Reports his temp was 102F. He denied any respiratory symptoms on admission and COVID was negative. In the ED, T 102.58F, Pulse 113, BP 164/71. K+ 4.1, BUN 28, Cr 8.15, Hgb 9.4>>10.2 on 08/25/2019. Chest c-ray with no active disease. Patient was started on vancomycin and zosyn. Blood cultures were positive for streptococcus. Antibiotics were narrowed from vancomycin/cefepime/flagyl to ceftriaxone. Patient does have a [place] and a LUE AVG placed 07/14/2019. Per VVS notes, he did have a possible Gore reaction and recommended waiting until 08/31/2019 before accessing graft, however patient reports his graft has been cannulated 3 times successfully.  He reports no pain, bleeding or drainage from [facility].  Today, patient is feeling much better. Denies fever or chills. Denies SOB, CP, palpitations, orthopnea, abdominal pain, N/V/D. He does report slight swelling of his legs bilaterally. Reports he did have some swelling and soreness of the graft after cannulation on 08/24/2019 but denies redness, bleeding or drainage. BP 125/79, Hgb 10.2, WBC 8.9, K+ 4.1, BUN 44, Cr 9.78.   Past Medical History:  Diagnosis Date  . Anemia   . Anxiety    situational  . ESRD (end stage renal disease) ([place])    MWF- [facility]  . HLD (hyperlipidemia)   . Hypertension    Past Surgical History:  Procedure Laterality Date  . AV FISTULA PLACEMENT Left 07/14/2019   Procedure: INSERTION OF ARTERIOVENOUS (AV) GORE-TEX GRAFT ARM;  Surgeon:  Angelia Mould, MD;  Location: [facility];  Service: Vascular;  Laterality: Left;  . COLONOSCOPY    . HERNIA REPAIR     unbicial  . INSERTION OF DIALYSIS CATHETER Right 06/25/2019   Procedure: INSERTION OF TUNNEL DIALYSIS CATHETER;  Surgeon: Waynetta Sandy, MD;  Location: [facility];  Service: Vascular;  Laterality: Right;   Family History  Problem Relation Age of Onset  . Lung cancer Father   . Hypertension Brother    Social History:  reports that he quit smoking about 33 years ago. He quit after 2.00 years of use. He has never used smokeless tobacco. He reports previous alcohol use. He reports that he does not use drugs.  ROS: As per HPI otherwise negative.  Physical Exam: Vitals:   08/25/19 0911 08/25/19 1934 08/26/19 0556 08/26/19 0558  BP: 125/77 134/83 125/79 125/79  Pulse: 95 95 78 79  Resp: (!) 21 16 14  (!) 25  Temp: (!) 100.4 F (38 C) 98.4 F (36.9 C) 98.1 F (36.7 C)   TempSrc: Oral Oral Oral   SpO2: 100% 100% 99% 99%  Weight:   72.2 kg   Height:         General: Well developed, well nourished, in no acute distress. Eating breakfast Head: Normocephalic, atraumatic, sclera non-icteric, mucus membranes are moist. Neck:  JVD not elevated. Lungs: Clear bilaterally to auscultation without wheezes, rales, or rhonchi. Breathing is unlabored. Heart: RRR with normal S1, S2. No murmurs, rubs, or gallops appreciated. Abdomen: Soft, non-tender, non-distended with normoactive bowel sounds. No rebound/guarding. No obvious abdominal masses. Musculoskeletal:  Strength and tone appear normal  for age. Lower extremities: Trace edema b/l lower extremities Neuro: Alert and oriented X 3. Moves all extremities spontaneously. Psych:  Responds to questions appropriately with a normal affect. Dialysis Access: R IJ TDC without bleeding, erythema or drainage. LUE AVG mild localized edema, tender to palpation, no redness/bleeding/drainage noted.  Allergies  Allergen Reactions  .  Ace Inhibitors Swelling and Other (See Comments)  . Enalapril Swelling    ANGIOEDEMA of lips  . Iron Sucrose Itching   Prior to Admission medications   Medication Sig Start Date End Date Taking? Authorizing Provider  acetaminophen (TYLENOL) 500 MG tablet Take 500-1,000 mg by mouth every 6 (six) hours as needed (pain).   Yes [provider]  amLODipine (NORVASC) 10 MG tablet Take 10 mg by mouth daily. 04/12/19  Yes [provider]  aspirin 81 MG chewable tablet Chew 81 mg by mouth daily.   Yes [provider]  B Complex-C-Zn-Folic Acid (DIALYVITE AB-[id number] 15 PO) Take 1 tablet by mouth daily at 3 pm.   Yes [provider]  calcitRIOL (ROCALTROL) 0.25 MCG capsule Take 0.25 mcg by mouth daily. 06/12/19  Yes [provider]  calcium acetate (PHOSLO) 667 MG capsule Take 1 capsule (667 mg total) by mouth 3 (three) times daily with meals. Patient taking differently: Take 1,334 mg by mouth 3 (three) times daily with meals.  04/22/19  Yes Lavina Hamman, MD  furosemide (LASIX) 80 MG tablet Take 80 mg by mouth daily. 01/22/19  Yes [provider]  hydrALAZINE (APRESOLINE) 25 MG tablet Take 1 tablet (25 mg total) by mouth 3 (three) times daily. 04/22/19  Yes Lavina Hamman, MD  hydrocortisone (ANUSOL-HC) 2.5 % rectal cream Place 1 application rectally 2 (two) times daily. 07/24/19  Yes Deno Etienne, DO  latanoprost (XALATAN) 0.005 % ophthalmic solution Place 1 drop into both eyes at bedtime.   Yes [provider]  metoprolol tartrate (LOPRESSOR) 100 MG tablet Take 100 mg by mouth 2 (two) times a day. 04/12/19  Yes [provider]  nitroGLYCERIN (NITROSTAT) 0.4 MG SL tablet Place 0.4 mg under the tongue every 5 (five) minutes as needed for chest pain.  03/27/19  Yes [provider]  oxyCODONE-acetaminophen (PERCOCET) 5-325 MG tablet Take 1 tablet by mouth every 6 (six) hours as needed. Patient taking differently: Take 1 tablet by mouth  every 6 (six) hours as needed for moderate pain.  07/21/19  Yes Rhyne, Hulen Shouts, PA-C  traZODone (DESYREL) 50 MG tablet Take 50 mg by mouth at bedtime.   Yes [provider]  hydrocortisone (ANUSOL-HC) 25 MG suppository Place 1 suppository (25 mg total) rectally 2 (two) times daily. For 7 days Patient not taking: Reported on 08/25/2019 07/24/19   Deno Etienne, DO   Current Facility-Administered Medications  Medication Dose Route Frequency Provider Last Rate Last Dose  . acetaminophen (TYLENOL) tablet 650 mg  650 mg Oral Q6H PRN Omar Person, NP   650 mg at 08/25/19 2312  . calcitRIOL (ROCALTROL) capsule 0.25 mcg  0.25 mcg Oral Daily Hayden Pedro M, NP   0.25 mcg at 08/25/19 0900  . calcium acetate (PHOSLO) capsule 1,334 mg  1,334 mg Oral TID WC Omar Person, NP   1,334 mg at 08/25/19 1823  . cefTRIAXone (ROCEPHIN) 1 g in sodium chloride 0.9 % 100 mL IVPB  1 g Intravenous QHS Kris Mouton, RPH      . heparin injection 5,000 Units  5,000 Units Subcutaneous Q8H Harold Hedge,  MD   5,000 Units at 08/26/19 0642  . hydrocortisone (ANUSOL-HC) 2.5 % rectal cream 1 application  1 application Rectal BID Omar Person, NP      . latanoprost (XALATAN) 0.005 % ophthalmic solution 1 drop  1 drop Both Eyes QHS Omar Person, NP   1 drop at 08/25/19 2214  . ondansetron (ZOFRAN) injection 4 mg  4 mg Intravenous Q6H PRN Omar Person, NP      . traZODone (DESYREL) tablet 50 mg  50 mg Oral QHS Omar Person, NP   50 mg at 08/25/19 2214   Labs: Basic Metabolic Panel: Recent Labs  Lab 08/24/19 2012 08/25/19 0737 08/26/19 0228  NA 137 137 136  K 3.8 4.0 4.1  CL 96* 98 96*  CO2 29 27 28   GLUCOSE 122* 93 95  BUN 16 21 44*  CREATININE 6.12* 7.48* 9.78*  CALCIUM 9.2 8.7* 8.8*   Liver Function Tests: Recent Labs  Lab 08/24/19 2012  AST 16  ALT 17  ALKPHOS 54  BILITOT 0.5  PROT 7.5  ALBUMIN 4.2   No results for input(s): LIPASE, AMYLASE in the last  168 hours. No results for input(s): AMMONIA in the last 168 hours. CBC: Recent Labs  Lab 08/24/19 2012 08/25/19 0737  WBC 7.3 8.9  NEUTROABS 6.3  --   HGB 10.8* 10.2*  HCT 32.8* 31.3*  MCV 89.6 90.5  PLT 181 163   Iron Studies:  Recent Labs    08/25/19 0212  IRON 11*  TIBC 211*  FERRITIN 375*   Studies/Results: Dg Chest Portable 1 View  Result Date: 08/24/2019 CLINICAL DATA:  Fever EXAM: PORTABLE CHEST 1 VIEW COMPARISON:  06/25/2019 FINDINGS: Right-sided central venous port tip over the SVC. No focal opacity or pleural effusion. Normal heart size. No pneumothorax IMPRESSION: No active disease. Electronically Signed   By: Donavan Foil M.D.   On: 08/24/2019 20:32    Dialysis Orders: Center: [facility]  on MWF. Time: 4 hours, BFR 400/ DFR 800, EDW 72kg, 180NRe, 3K/ 2.25 Ca, AVG L forearm Mircera 200 mcg q 2 weeks- last dose 08/24/2019 Heparin 2000 unit bolus Hectorol 1 mcg IV q HD  Assessment/Plan: 1.  Streptococcus bacteremia: Presented with fever 102F. Started on empiric vancomycin/cefepime/flagyl. Blood cultures positive for streptococcus and antibiotics narrowed to ceftriaxone. CXR negative. TDC and AVG remain possible sources of infection, however AVG edema/tenderness was previously noted by VVS to be possible Gore reaction. Will ask VVS to evaluate AVG. If it does not appear infected, will plan for [facility] removal.  2.  ESRD:  MWF. Will plan for HD today per regular schedule. AVG has been cannulated successfully x3, will plan to use again today unless otherwise advised by VVS.  3.  Hypertension/volume: BP controlled. Trace edema on exam. Currently 0.2kg above EDW by weights here, UF as tolerated. May need EDW lowered slightly at discharge.  4.  Anemia: Hgb 10.2. Not due for ESA. Tsat is low but will hold IV Fe for now due to sepsis.  5.  Metabolic bone disease: Calcium 8.8. Continue hectorol.  6.  Nutrition:  Renal diet with fluid restrictions.   Anice Paganini, PA-C 08/26/2019, 8:48 AM  [facility] Pager: [phone]  I have seen and examined this patient and agree with plan and assessment in the above note with renal recommendations/intervention highlighted.  His left forearm AVG does not appear to be infected, nor does his RIJ TDC.  He does have  several dental caries which may be the source and should have a panorama of his jaws to r/o abscess.  Will consult VVS to remove TDC and evaluatge his LAVG.  Continue with Abx and narrow spectrum based on sensitivities.  Governor Rooks Demetreus Lothamer,MD 08/26/2019 12:52 PM

## 2019-08-27 ENCOUNTER — Inpatient Hospital Stay (HOSPITAL_COMMUNITY): Payer: Medicare Other

## 2019-08-27 DIAGNOSIS — B9689 Other specified bacterial agents as the cause of diseases classified elsewhere: Secondary | ICD-10-CM | POA: Diagnosis present

## 2019-08-27 DIAGNOSIS — R7881 Bacteremia: Secondary | ICD-10-CM | POA: Diagnosis present

## 2019-08-27 LAB — BASIC METABOLIC PANEL
Anion gap: 13 (ref 5–15)
BUN: 18 mg/dL (ref 8–23)
CO2: 28 mmol/L (ref 22–32)
Calcium: 9 mg/dL (ref 8.9–10.3)
Chloride: 97 mmol/L — ABNORMAL LOW (ref 98–111)
Creatinine, Ser: 5.96 mg/dL — ABNORMAL HIGH (ref 0.61–1.24)
GFR calc Af Amer: 10 mL/min — ABNORMAL LOW (ref >60)
GFR calc non Af Amer: 9 mL/min — ABNORMAL LOW (ref >60)
Glucose, Bld: 78 mg/dL (ref 70–99)
Potassium: 3.7 mmol/L (ref 3.5–5.1)
Sodium: 138 mmol/L (ref 135–145)

## 2019-08-27 LAB — CBC
HCT: 28 % — ABNORMAL LOW (ref 39.0–52.0)
Hemoglobin: 9.4 g/dL — ABNORMAL LOW (ref 13.0–17.0)
MCH: 29.7 pg (ref 26.0–34.0)
MCHC: 33.6 g/dL (ref 30.0–36.0)
MCV: 88.6 fL (ref 80.0–100.0)
Platelets: 156 10*3/uL (ref 150–400)
RBC: 3.16 MIL/uL — ABNORMAL LOW (ref 4.22–5.81)
RDW: 16.2 % — ABNORMAL HIGH (ref 11.5–15.5)
WBC: 6.5 10*3/uL (ref 4.0–10.5)
nRBC: 0 % (ref 0.0–0.2)

## 2019-08-27 LAB — CULTURE, BLOOD (ROUTINE X 2): Special Requests: ADEQUATE

## 2019-08-27 LAB — ECHOCARDIOGRAM COMPLETE
Height: 71 in
Weight: 2512 oz

## 2019-08-27 LAB — PROCALCITONIN: Procalcitonin: 6.12 ng/mL

## 2019-08-27 MED ORDER — LIDOCAINE HCL (PF) 1 % IJ SOLN
INTRAMUSCULAR | Status: AC
  Filled 2019-08-27: qty 10

## 2019-08-27 MED ORDER — CEFAZOLIN SODIUM-DEXTROSE 1-4 GM/50ML-% IV SOLN
1.0000 g | INTRAVENOUS | Status: DC
  Administered 2019-08-27: 1 g via INTRAVENOUS
  Filled 2019-08-27 (×2): qty 50

## 2019-08-27 MED ORDER — LIDOCAINE HCL (PF) 2 % IJ SOLN
10.0000 mL | Freq: Once | INTRAMUSCULAR | Status: DC | PRN
  Filled 2019-08-27: qty 10

## 2019-08-27 NOTE — Progress Notes (Signed)
PA at bedside to removal tunneled HD cath. All supplies ordered and at bedside. Vitals monitored for 30 mins after procedure. Pt denies complaints.  Jerald Kief, RN

## 2019-08-27 NOTE — Progress Notes (Signed)
With bacteremia, concern for IE and I have requested a TEE.  I will change him to cefazolin with dialysis.   Thayer Headings, MD

## 2019-08-27 NOTE — Progress Notes (Signed)
  Catheter Removal Procedure Note    Diagnosis: ESRD  Plan:  Remove right diatek catheter  Consent signed:  Yes.   Time out completed:  Yes.   Coumadin:  No. PT/INR (if applicable):  1.1 Other labs:  Procedure: 1.  Sterile prepping and draping over catheter area 2. 6 ml 2% lidocaine plain instilled at removal site. 3.  right catheter removed in its entirety with cuff in tact. 4.  Complications:  none 5. Tip of catheter sent for culture:  Yes.     Patient tolerated procedure well:  Yes.   Pressure held, no bleeding noted, dressing applied Instructions given to the pt regarding wound care and bleeding.  Other: suture may be removed in one week at dialysis  [facility], PA-C 08/27/2019 7:57 AM [phone]

## 2019-08-27 NOTE — Progress Notes (Signed)
Patient ID: Jesus Ayala, male   DOB: [birth year], 68 y.o.   MRN: [id number] S: Having RIJ TDC removed by VVS today.  Seen by ID who recommends Echo to r/o SBE. O:BP 124/83   Pulse 86   Temp 98.3 F (36.8 C) (Oral)   Resp 20   Ht 5\' 11"  (1.803 m)   Wt 71.2 kg   SpO2 100%   BMI 21.90 kg/m   Intake/Output Summary (Last 24 hours) at 08/27/2019 1054 Last data filed at 08/27/2019 0449 Gross per 24 hour  Intake 100.06 ml  Output 1457 ml  Net -1356.94 ml   Intake/Output: I/O last 3 completed shifts: In: 100.1 [IV Piggyback:100.1] Out: [id number]  Intake/Output this shift:  No intake/output data recorded. Weight change: 0.533 kg Gen: NAD CVS: no rub Resp: cta  [id number] Ext:LUE AVG +T/B, no edema  Recent Labs  Lab 08/24/19 2012 08/25/19 0737 08/26/19 0228 08/27/19 0243  NA 137 137 136 138  K 3.8 4.0 4.1 3.7  CL 96* 98 96* 97*  CO2 29 27 28 28   GLUCOSE 122* 93 95 78  BUN 16 21 44* 18  CREATININE 6.12* 7.48* 9.78* 5.96*  ALBUMIN 4.2  --   --   --   CALCIUM 9.2 8.7* 8.8* 9.0  AST 16  --   --   --   ALT 17  --   --   --    Liver Function Tests: Recent Labs  Lab 08/24/19 2012  AST 16  ALT 17  ALKPHOS 54  BILITOT 0.5  PROT 7.5  ALBUMIN 4.2   No results for input(s): LIPASE, AMYLASE in the last 168 hours. No results for input(s): AMMONIA in the last 168 hours. CBC: Recent Labs  Lab 08/24/19 2012 08/25/19 0737 08/26/19 1355 08/27/19 0243  WBC 7.3 8.9 5.7 6.5  NEUTROABS 6.3  --   --   --   HGB 10.8* 10.2* 9.9* 9.4*  HCT 32.8* 31.3* 29.1* 28.0*  MCV 89.6 90.5 89.3 88.6  PLT 181 163 170 156   Cardiac Enzymes: No results for input(s): CKTOTAL, CKMB, CKMBINDEX, TROPONINI in the last 168 hours. CBG: No results for input(s): GLUCAP in the last 168 hours.  Iron Studies:  Recent Labs    08/25/19 0212  IRON 11*  TIBC 211*  FERRITIN 375*   Studies/Results: No results found. [name] amLODipine  10 mg Oral Daily  . aspirin  81 mg Oral Daily  .  calcitRIOL  0.25 mcg Oral Daily  . calcium acetate  1,334 mg Oral TID WC  . Chlorhexidine Gluconate Cloth  6 each Topical Q0600  . furosemide  80 mg Oral Daily  . heparin  5,000 Units Subcutaneous Q8H  . hydrALAZINE  25 mg Oral TID  . hydrocortisone  1 application Rectal BID  . latanoprost  1 drop Both Eyes QHS  . metoprolol tartrate  100 mg Oral BID  . traZODone  50 mg Oral QHS    BMET    Component Value Date/Time   NA 138 08/27/2019 0243   K 3.7 08/27/2019 0243   CL 97 (L) 08/27/2019 0243   CO2 28 08/27/2019 0243   GLUCOSE 78 08/27/2019 0243   BUN 18 08/27/2019 0243   CREATININE 5.96 (H) 08/27/2019 0243   CALCIUM 9.0 08/27/2019 0243   CALCIUM 8.7 04/21/2019 0646   GFRNONAA 9 (L) 08/27/2019 0243   GFRAA 10 (L) 08/27/2019 0243   CBC    Component Value Date/Time   WBC 6.5  08/27/2019 0243   RBC 3.16 (L) 08/27/2019 0243   HGB 9.4 (L) 08/27/2019 0243   HCT 28.0 (L) 08/27/2019 0243   PLT 156 08/27/2019 0243   MCV 88.6 08/27/2019 0243   MCH 29.7 08/27/2019 0243   MCHC 33.6 08/27/2019 0243   RDW 16.2 (H) 08/27/2019 0243   LYMPHSABS 0.4 (L) 08/24/2019 2012   MONOABS 0.5 08/24/2019 2012   EOSABS 0.0 08/24/2019 2012   BASOSABS 0.0 08/24/2019 2012     Dialysis Orders: Center: [facility]  on MWF. Time: 4 hours, BFR 400/ DFR 800, EDW 72kg, 180NRe, 3K/ 2.25 Ca, AVG L forearm Mircera 200 mcg q 2 weeks- last dose 08/24/2019 Heparin 2000 unit bolus Hectorol 1 mcg IV q HD  Assessment/Plan: 1.  Streptococcus bacteremia: Presented with fever 102F. Started on empiric vancomycin/cefepime/flagyl. Blood cultures positive for streptococcus and antibiotics narrowed to ceftriaxone. CXR negative. TDC and AVG remain possible sources of infection, however AVG edema/tenderness was previously noted by VVS to be possible Gore reaction.  1. AVG does not appear infected and TDC being removed today, will send tip for cx 2. Pt also with multiple dental caries and should be  evaluated by Dr. Mahlon Gammon while he is an inpatient or at least have panorama performed of his jaws to rule out abscess 2.  ESRD:  MWF. Will plan for HD today per regular schedule. AVG has been cannulated successfully x3, will plan to use again today unless otherwise advised by VVS.  3.  Hypertension/volume: BP controlled. Trace edema on exam. Currently 0.2kg above EDW by weights here, UF as tolerated. May need EDW lowered slightly at discharge.  4.  Anemia: Hgb 10.2. Not due for ESA. Tsat is low but will hold IV Fe for now due to sepsis.  5.  Metabolic bone disease: Calcium 8.8. Continue hectorol.  6.  Nutrition:  Renal diet with fluid restrictions.   Donetta Potts, MD [employer] [phone]

## 2019-08-27 NOTE — Progress Notes (Signed)
PROGRESS NOTE    Patient: Jesus Ayala                            PCP: Seward Carol, PA-C                    DOB: [birth year]            DOA: 08/24/2019 [place]:[id number]             DOS: 08/27/2019, 1:42 PM   LOS: 2 days   Date of Service: The patient was seen and examined on 08/27/2019  Subjective:   The patient was seen and examined this morning, stable no acute distress. Denies have having any fever  Or chills this AM, Denies any shortness or chest pain at this time. T-max 100.4. Patient was notified of 1 out of 2 blood cultures was growing Streptococcus But does   Brief Narrative:   Jesus Ayala is a 68 y.o. male with a PMH of ESRD on dialysis, anemia, HTN and HLD who presented to the ED on 08/24/2019 with dizziness and fever. Patient reports he finished his dialysis treatment on Monday and felt fine, but developed dizziness and heart palpitations about 30 minutes after he got home. Reports his temp was 102F. He denied any respiratory symptoms on admission and COVID was negative. In the ED, T 102.43F, Pulse 113, BP 164/71. K+ 4.1, BUN 28, Cr 8.15, Hgb 9.4>>10.2 on 08/25/2019. Chest c-ray with no active disease. Patient was started on vancomycin and zosyn. Blood cultures were positive for streptococcus. Antibiotics were narrowed from vancomycin/cefepime/flagyl to ceftriaxone. Patient does have a [place] and a LUE AVG placed 07/14/2019. Per VVS notes, he did have a possible Gore reaction and recommended waiting until 08/31/2019 before accessing graft, however patient reports his graft has been cannulated 3 times successfully.  He reports no pain, bleeding or drainage from [facility].   Assessment & Plan:   Active Problems:   HLD (hyperlipidemia)   Hypertension   Normocytic anemia   ESRD (end stage renal disease) (HCC)   SIRS (systemic inflammatory response syndrome) (HCC)  SIRS /Fever/possible bacteremia -Patient remained stable afebrile normotensive this morning.  Temp 98.3, WBC 8.9   -Present on admission temperature of 102.5 , T-max 100.4 -08/25/2019 1 out of 2 blood culture growing Streptococcus,  -Concern for endocarditis -Infectious disease following, recommended echocardiogram -Echo ordered we will follow accordingly,/likely needing TEE  -08/25/2019 -repeated blood cultures negative to date -No signs of sepsis, remains afebrile normotensive -On admission patient was started on vancomycin / cefepime /Flagyl >>> antibiotic narrowed down to ceftriaxone IV  -No source of infection at this time identified -The patient has AVG  left upper arm -no signs of infection -Right chest wall Port-A-Cath will be removed today no signs of infection   End-stage renal disease -Hemodialysis days M/WF -Patient started on hemodialysis in September 2020, status post left upper extremity AV fistula placement on 07/14/2019, -Nephrologist consulted, due for hemodialysis today  History of chronic anemia due to chronic disease, end-stage renal disease -H&H stable -Patient has had transfusion in the past due to bleeding from catheter site.   Hyperlipidemia -Continue statins  Hypertension -As patient improves, continue home medication of Lopressor, hydralazine, Norvasc, -Lasix  ?AVG graft infection -Status post left FA loop graft by Dr. Doren Custard 07/14/2019 -TCD placement on 06/25/2019 by Dr. Donzetta Matters  -No signs of overt infection at this time monitoring closely -Appreciate vascular team input  Cultures; 08/25/2019 blood cultures >> Streptococcus Repeat blood cultures 08/25/2019 >>  Antimicrobials: Vancomycin/cefepime/Flagyl >>> 08/25/2019.  08/25/19 IV Rocephin>>   DVT prophylaxis: SCD/Compression stockings Code Status:   Code Status: Prior Family Communication: No family member present at bedside- attempt will be made to update daily The above findings and plan of care has been discussed with patient in detail,  they expressed understanding and agreement of above. Disposition Plan:    Anticipated 1-2 days Admission status:  NPATIEN -   Consultants: Nephrology  Procedures:   No admission procedures for hospital encounter.   Antimicrobials:  Anti-infectives (From admission, onward)   Start     Dose/Rate Route Frequency Ordered Stop   08/27/19 2200  ceFAZolin (ANCEF) IVPB 1 g/50 mL premix     1 g 100 mL/hr over 30 Minutes Intravenous Every 24 hours 08/27/19 1115     08/26/19 2200  cefTRIAXone (ROCEPHIN) 1 g in sodium chloride 0.9 % 100 mL IVPB  Status:  Discontinued     1 g 200 mL/hr over 30 Minutes Intravenous Daily at bedtime 08/25/19 1454 08/26/19 1202   08/26/19 2200  cefTRIAXone (ROCEPHIN) 2 g in sodium chloride 0.9 % 100 mL IVPB  Status:  Discontinued     2 g 200 mL/hr over 30 Minutes Intravenous Daily at bedtime 08/26/19 1202 08/27/19 1115   08/26/19 2000  ceFEPIme (MAXIPIME) 2 g in sodium chloride 0.9 % 100 mL IVPB  Status:  Discontinued     2 g 200 mL/hr over 30 Minutes Intravenous Every M-W-F (2000) 08/25/19 0346 08/25/19 1445   08/26/19 1200  vancomycin (VANCOCIN) IVPB 750 mg/150 ml premix  Status:  Discontinued     750 mg 150 mL/hr over 60 Minutes Intravenous Every M-W-F (Hemodialysis) 08/25/19 0346 08/25/19 1445   08/25/19 1600  cefTRIAXone (ROCEPHIN) 1 g in dextrose 5 % 50 mL IVPB  Status:  Discontinued     1 g 120 mL/hr over 30 Minutes Intravenous Every 24 hours 08/25/19 1445 08/25/19 1454   08/25/19 1600  cefTRIAXone (ROCEPHIN) 2 g in sodium chloride 0.9 % 100 mL IVPB     2 g 200 mL/hr over 30 Minutes Intravenous  Once 08/25/19 1454 08/25/19 1903   08/25/19 0800  metroNIDAZOLE (FLAGYL) IVPB 500 mg  Status:  Discontinued     500 mg 100 mL/hr over 60 Minutes Intravenous Every 8 hours 08/25/19 0337 08/25/19 1445   08/24/19 2345  piperacillin-tazobactam (ZOSYN) IVPB 3.375 g     3.375 g 100 mL/hr over 30 Minutes Intravenous  Once 08/24/19 2330 08/25/19 0119   08/24/19 2330  vancomycin (VANCOCIN) 1,250 mg in sodium chloride 0.9 % 250 mL IVPB      1,250 mg 166.7 mL/hr over 90 Minutes Intravenous  Once 08/24/19 2329 08/25/19 0150   08/24/19 2330  piperacillin-tazobactam (ZOSYN) IVPB 4.5 g  Status:  Discontinued     4.5 g 133.3 mL/hr over 30 Minutes Intravenous  Once 08/24/19 2329 08/24/19 2330       Medication:  . amLODipine  10 mg Oral Daily  . aspirin  81 mg Oral Daily  . calcitRIOL  0.25 mcg Oral Daily  . calcium acetate  1,334 mg Oral TID WC  . Chlorhexidine Gluconate Cloth  6 each Topical Q0600  . furosemide  80 mg Oral Daily  . heparin  5,000 Units Subcutaneous Q8H  . hydrALAZINE  25 mg Oral TID  . hydrocortisone  1 application Rectal BID  . latanoprost  1 drop Both Eyes QHS  .  metoprolol tartrate  100 mg Oral BID  . traZODone  50 mg Oral QHS    acetaminophen, lidocaine, nitroGLYCERIN, ondansetron (ZOFRAN) IV   Objective:   Vitals:   08/27/19 0751 08/27/19 0818 08/27/19 1055 08/27/19 1100  BP: 124/83 124/83 128/75 123/74  Pulse: 86 86 73 72  Resp: 20     Temp: 98.3 F (36.8 C)     TempSrc: Oral     SpO2: 100%  100% 100%  Weight:      Height:        Intake/Output Summary (Last 24 hours) at 08/27/2019 1342 Last data filed at 08/27/2019 0449 Gross per 24 hour  Intake 100.06 ml  Output 1457 ml  Net -1356.94 ml   Filed Weights   08/26/19 1200 08/26/19 1609 08/27/19 0500  Weight: 72.7 kg 71 kg 71.2 kg     Examination:   Physical Exam  BP 123/74 (BP Location: Right Arm)   Pulse 72   Temp 98.3 F (36.8 C) (Oral)   Resp 20   Ht 5\' 11"  (1.803 m)   Wt 71.2 kg   SpO2 100%   BMI 21.90 kg/m    Physical Exam  Constitution:  Alert, cooperative, no distress,  Psychiatric: Normal and stable mood and affect, cognition intact,   HEENT: Normocephalic, PERRL, otherwise with in Normal limits  Chest:Chest symmetric Cardio vascular:  S1/S2, RRR, No murmure, No Rubs or Gallops  pulmonary: Clear to auscultation bilaterally, respirations unlabored, negative wheezes / crackles Abdomen: Soft, non-tender,  non-distended, bowel sounds,no masses, no organomegaly Muscular skeletal: Limited exam - in bed, able to move all 4 extremities, Normal strength,  Neuro: CNII-XII intact. , normal motor and sensation, reflexes intact  Extremities: No pitting edema lower extremities, +2 pulses  Skin: Dry, warm to touch, negative for any Rashes, No open wounds Wounds: Right chest wall Port-A-Cath left extremity AV fistula, no signs of erythema edema tenderness or drainage      LABs:  CBC Latest Ref Rng & Units 08/27/2019 08/26/2019 08/25/2019  WBC 4.0 - 10.5 K/uL 6.5 5.7 8.9  Hemoglobin 13.0 - 17.0 g/dL 9.4(L) 9.9(L) 10.2(L)  Hematocrit 39.0 - 52.0 % 28.0(L) 29.1(L) 31.3(L)  Platelets 150 - 400 K/uL 156 170 163   CMP Latest Ref Rng & Units 08/27/2019 08/26/2019 08/25/2019  Glucose 70 - 99 mg/dL 78 95 93  BUN 8 - 23 mg/dL 18 44(H) 21  Creatinine 0.61 - 1.24 mg/dL 5.96(H) 9.78(H) 7.48(H)  Sodium 135 - 145 mmol/L 138 136 137  Potassium 3.5 - 5.1 mmol/L 3.7 4.1 4.0  Chloride 98 - 111 mmol/L 97(L) 96(L) 98  CO2 22 - 32 mmol/L 28 28 27   Calcium 8.9 - 10.3 mg/dL 9.0 8.8(L) 8.7(L)  Total Protein 6.5 - 8.1 g/dL - - -  Total Bilirubin 0.3 - 1.2 mg/dL - - -  Alkaline Phos 38 - 126 U/L - - -  AST 15 - 41 U/L - - -  ALT 0 - 44 U/L - - -        SIGNED: Deatra James, MD, FACP, FHM. [facility],  Pager [phone]  If 7PM-7AM, please contact night-coverage [email or web address] 08/27/2019, 1:42 PM

## 2019-08-27 NOTE — Progress Notes (Signed)
*  PRELIMINARY RESULTS* Echocardiogram 2D Echocardiogram has been performed.  Jesus Ayala 08/27/2019, 10:17 AM

## 2019-08-27 NOTE — Progress Notes (Signed)
H&P    CC:  Catheter removal   HPI:  This is a 68 y.o. male in need of tunneled dialysis catheter removal.  He had the [facility] placed on 06/25/2019 by Dr. Donzetta Matters.  He subsequently underwent left FA loop graft placement on 07/14/2019 by Dr. Scot Dock.  He was admitted to the hospital a couple of days ago with fever and was found to have Strep mitis/oralis.  Second set of blood cx are negative.  VVS was asked to evaluate his graft, which looks fine without evidence of infection and has been used successfully 3-4 times.  We will remove the catheter and send tip for cx.     Past Medical History:  Diagnosis Date  . Anemia   . Anxiety    situational  . ESRD (end stage renal disease) ([place])    MWF- [place]  . HLD (hyperlipidemia)   . Hypertension     FH:  Non-Contributory  Social History   Socioeconomic History  . Marital status: Married    Spouse name: Not on file  . Number of children: Not on file  . Years of education: Not on file  . Highest education level: Not on file  Occupational History  . Not on file  Social Needs  . Financial resource strain: Not on file  . Food insecurity    Worry: Not on file    Inability: Not on file  . Transportation needs    Medical: Not on file    Non-medical: Not on file  Tobacco Use  . Smoking status: Former Smoker    Years: 2.00    Quit date: 1987    Years since quitting: 33.8  . Smokeless tobacco: Never Used  Substance and Sexual Activity  . Alcohol use: Not Currently  . Drug use: No  . Sexual activity: Not on file  Lifestyle  . Physical activity    Days per week: Not on file    Minutes per session: Not on file  . Stress: Not on file  Relationships  . Social Herbalist on phone: Not on file    Gets together: Not on file    Attends religious service: Not on file    Active member of club or organization: Not on file    Attends meetings of clubs or organizations: Not on file    Relationship status: Not on file  .  Intimate partner violence    Fear of current or ex partner: Not on file    Emotionally abused: Not on file    Physically abused: Not on file    Forced sexual activity: Not on file  Other Topics Concern  . Not on file  Social History Narrative  . Not on file    Allergies  Allergen Reactions  . Ace Inhibitors Swelling and Other (See Comments)  . Enalapril Swelling    ANGIOEDEMA of lips  . Iron Sucrose Itching    Current Facility-Administered Medications  Medication Dose Route Frequency Provider Last Rate Last Dose  . acetaminophen (TYLENOL) tablet 650 mg  650 mg Oral Q6H PRN Omar Person, NP   650 mg at 08/25/19 2312  . amLODipine (NORVASC) tablet 10 mg  10 mg Oral Daily Shahmehdi, Seyed A, MD   10 mg at 08/26/19 1816  . aspirin chewable tablet 81 mg  81 mg Oral Daily Shahmehdi, Seyed A, MD   81 mg at 08/26/19 1817  . calcitRIOL (ROCALTROL) capsule 0.25 mcg  0.25 mcg Oral  Daily Omar Person, NP   0.25 mcg at 08/26/19 0855  . calcium acetate (PHOSLO) capsule 1,334 mg  1,334 mg Oral TID WC Omar Person, NP   1,334 mg at 08/26/19 1817  . cefTRIAXone (ROCEPHIN) 2 g in sodium chloride 0.9 % 100 mL IVPB  2 g Intravenous QHS Deatra James, MD   Stopped at 08/26/19 2250  . Chlorhexidine Gluconate Cloth 2 % PADS 6 each  6 each Topical Q0600 Janalee Dane, PA-C   6 each at 08/27/19 0557  . furosemide (LASIX) tablet 80 mg  80 mg Oral Daily Shahmehdi, Seyed A, MD   80 mg at 08/26/19 1816  . heparin injection 5,000 Units  5,000 Units Subcutaneous Q8H Harold Hedge, MD   5,000 Units at 08/27/19 [id number]  . hydrALAZINE (APRESOLINE) tablet 25 mg  25 mg Oral TID Skipper Cliche A, MD   25 mg at 08/26/19 2213  . hydrocortisone (ANUSOL-HC) 2.5 % rectal cream 1 application  1 application Rectal BID Omar Person, NP      . latanoprost (XALATAN) 0.005 % ophthalmic solution 1 drop  1 drop Both Eyes QHS Omar Person, NP   1 drop at 08/26/19 2212  . lidocaine  (XYLOCAINE) 2 % injection 10 mL  10 mL Intradermal Once PRN Orlondo Holycross J, PA-C      . metoprolol tartrate (LOPRESSOR) tablet 100 mg  100 mg Oral BID Skipper Cliche A, MD   100 mg at 08/26/19 2213  . nitroGLYCERIN (NITROSTAT) SL tablet 0.4 mg  0.4 mg Sublingual Q5 min PRN Shahmehdi, Seyed A, MD      . ondansetron (ZOFRAN) injection 4 mg  4 mg Intravenous Q6H PRN Omar Person, NP      . traZODone (DESYREL) tablet 50 mg  50 mg Oral QHS Omar Person, NP   50 mg at 08/26/19 2213    ROS:  See HPI  PHYSICAL EXAM  Vitals:   08/26/19 2212 08/27/19 0500  BP: 132/87 120/79  Pulse: 88 79  Resp:  (!) 24  Temp:  98 F (36.7 C)  SpO2:  97%    Gen:  Well developed well nourished HEENT:  normocephalic Neck:  Right TDC in place Lungs:  Non-labored Extremities:  Excellent thrill in the graft with palpable left radial pulse Skin:  No obvious rashes   Lab:  INR 1.1 08/25/2019   Impression: This is a 68 y.o. male  In need of tunneled catheter removal  Plan:  Removal of right diatek catheter   Leontine Locket, PA-C [facility] [phone] 08/27/2019 7:48 AM

## 2019-08-27 NOTE — Plan of Care (Signed)
  Problem: Clinical Measurements: Goal: Ability to maintain clinical measurements within normal limits will improve Outcome: Progressing Goal: Will remain free from infection Outcome: Progressing   

## 2019-08-28 DIAGNOSIS — K029 Dental caries, unspecified: Secondary | ICD-10-CM

## 2019-08-28 LAB — CBC
HCT: 29.4 % — ABNORMAL LOW (ref 39.0–52.0)
Hemoglobin: 9.6 g/dL — ABNORMAL LOW (ref 13.0–17.0)
MCH: 29.4 pg (ref 26.0–34.0)
MCHC: 32.7 g/dL (ref 30.0–36.0)
MCV: 90.2 fL (ref 80.0–100.0)
Platelets: 192 10*3/uL (ref 150–400)
RBC: 3.26 MIL/uL — ABNORMAL LOW (ref 4.22–5.81)
RDW: 16.5 % — ABNORMAL HIGH (ref 11.5–15.5)
WBC: 5.4 10*3/uL (ref 4.0–10.5)
nRBC: 0 % (ref 0.0–0.2)

## 2019-08-28 LAB — BASIC METABOLIC PANEL
Anion gap: 14 (ref 5–15)
BUN: 28 mg/dL — ABNORMAL HIGH (ref 8–23)
CO2: 26 mmol/L (ref 22–32)
Calcium: 9.1 mg/dL (ref 8.9–10.3)
Chloride: 96 mmol/L — ABNORMAL LOW (ref 98–111)
Creatinine, Ser: 8.58 mg/dL — ABNORMAL HIGH (ref 0.61–1.24)
GFR calc Af Amer: 7 mL/min — ABNORMAL LOW (ref >60)
GFR calc non Af Amer: 6 mL/min — ABNORMAL LOW (ref >60)
Glucose, Bld: 85 mg/dL (ref 70–99)
Potassium: 3.6 mmol/L (ref 3.5–5.1)
Sodium: 136 mmol/L (ref 135–145)

## 2019-08-28 MED ORDER — CEFAZOLIN SODIUM-DEXTROSE 2-4 GM/100ML-% IV SOLN
2.0000 g | INTRAVENOUS | Status: DC
  Administered 2019-08-28 – 2019-08-31 (×2): 2 g via INTRAVENOUS
  Filled 2019-08-28 (×2): qty 100

## 2019-08-28 NOTE — Progress Notes (Signed)
[facility]   Reason for visit: Follow up on bacteremia  Interval History: repeat blood cultures remain ngtd, no fever, WBC wnl.  He has no complaints and feels well.  Asking about going home.  No associated rash or diarrhea.    Physical Exam: Constitutional:  Vitals:   08/28/19 0509 08/28/19 0821  BP: 127/73 126/77  Pulse: 84 84  Resp: (!) 24 20  Temp: 98.1 F (36.7 C) 98.1 F (36.7 C)  SpO2: 99% 100%   patient appears in NAD Eyes: anicteric HENT: no thrush Respiratory: Normal respiratory effort; CTA B Cardiovascular: RRR GI: soft, nt, nd Skin: no rashes  Review of Systems: Constitutional: negative for fevers and chills Gastrointestinal: negative for nausea and diarrhea Integument/breast: negative for rash  Lab Results  Component Value Date   WBC 5.4 08/28/2019   HGB 9.6 (L) 08/28/2019   HCT 29.4 (L) 08/28/2019   MCV 90.2 08/28/2019   PLT 192 08/28/2019    Lab Results  Component Value Date   CREATININE 8.58 (H) 08/28/2019   BUN 28 (H) 08/28/2019   NA 136 08/28/2019   K 3.6 08/28/2019   CL 96 (L) 08/28/2019   CO2 26 08/28/2019    Lab Results  Component Value Date   ALT 17 08/24/2019   AST 16 08/24/2019   ALKPHOS 54 08/24/2019     Microbiology: Recent Results (from the past 240 hour(s))  Blood culture (routine x 2)     Status: Abnormal   Collection Time: 08/24/19  8:17 PM   Specimen: BLOOD RIGHT HAND  Result Value Ref Range Status   Specimen Description BLOOD RIGHT HAND  Final   Special Requests   Final    BOTTLES DRAWN AEROBIC AND ANAEROBIC Blood Culture results may not be optimal due to an excessive volume of blood received in culture bottles   Culture  Setup Time   Final    GRAM POSITIVE COCCI IN CHAINS IN BOTH AEROBIC AND ANAEROBIC BOTTLES    Culture (A)  Final    STREPTOCOCCUS MITIS/ORALIS SUSCEPTIBILITIES PERFORMED ON PREVIOUS CULTURE WITHIN THE LAST 5 DAYS. Performed at [facility], [place] [address] [place], [place] [address]    Report Status 08/27/2019 FINAL  Final  Blood culture (routine x 2)     Status: Abnormal   Collection Time: 08/24/19  8:17 PM   Specimen: BLOOD RIGHT FOREARM  Result Value Ref Range Status   Specimen Description BLOOD RIGHT FOREARM  Final   Special Requests   Final    BOTTLES DRAWN AEROBIC AND ANAEROBIC Blood Culture adequate volume   Culture  Setup Time   Final    GRAM POSITIVE COCCI IN CHAINS IN BOTH AEROBIC AND ANAEROBIC BOTTLES CRITICAL RESULT CALLED TO, READ BACK BY AND VERIFIED WITH: C. WALSTON PHARMD, AT 5093 08/25/19 BY D. VANHOOK CRITICAL RESULT CALLED TO, READ BACK BY AND VERIFIED WITH: C. WALSTON, PHARMD AT 1300 ON 08/25/19 BY C. JESSUP, MT. Performed at [facility], [place] [address] [place],  [address]    Culture STREPTOCOCCUS MITIS/ORALIS (A)  Final   Report Status 08/27/2019 FINAL  Final   Organism ID, Bacteria STREPTOCOCCUS MITIS/ORALIS  Final      Susceptibility   Streptococcus mitis/oralis - MIC*    TETRACYCLINE 0.5 SENSITIVE Sensitive     VANCOMYCIN <=0.12 SENSITIVE Sensitive     CLINDAMYCIN <=0.25 SENSITIVE Sensitive     PENICILLIN Value in next row Sensitive  SENSITIVE<=0.06    CEFTRIAXONE Value in next row Sensitive      SENSITIVE<=0.12    * STREPTOCOCCUS MITIS/ORALIS  Blood Culture ID Panel (Reflexed)     Status: Abnormal   Collection Time: 08/24/19  8:17 PM  Result Value Ref Range Status   Enterococcus species NOT DETECTED NOT DETECTED Final   Listeria monocytogenes NOT DETECTED NOT DETECTED Final   Staphylococcus species NOT DETECTED NOT DETECTED Final   Staphylococcus aureus (BCID) NOT DETECTED NOT DETECTED Final   Streptococcus species DETECTED (A) NOT DETECTED Final    Comment: Not Enterococcus species, Streptococcus agalactiae, Streptococcus pyogenes, or Streptococcus pneumoniae. CRITICAL RESULT CALLED TO, READ BACK BY AND VERIFIED WITH: C. WALSTON, PHARMD AT 1300 ON 08/25/19 BY C. JESSUP, MT.     Streptococcus agalactiae NOT DETECTED NOT DETECTED Final   Streptococcus pneumoniae NOT DETECTED NOT DETECTED Final   Streptococcus pyogenes NOT DETECTED NOT DETECTED Final   Acinetobacter baumannii NOT DETECTED NOT DETECTED Final   Enterobacteriaceae species NOT DETECTED NOT DETECTED Final   Enterobacter cloacae complex NOT DETECTED NOT DETECTED Final   Escherichia coli NOT DETECTED NOT DETECTED Final   Klebsiella oxytoca NOT DETECTED NOT DETECTED Final   Klebsiella pneumoniae NOT DETECTED NOT DETECTED Final   Proteus species NOT DETECTED NOT DETECTED Final   Serratia marcescens NOT DETECTED NOT DETECTED Final   Haemophilus influenzae NOT DETECTED NOT DETECTED Final   Neisseria meningitidis NOT DETECTED NOT DETECTED Final   Pseudomonas aeruginosa NOT DETECTED NOT DETECTED Final   Candida albicans NOT DETECTED NOT DETECTED Final   Candida glabrata NOT DETECTED NOT DETECTED Final   Candida krusei NOT DETECTED NOT DETECTED Final   Candida parapsilosis NOT DETECTED NOT DETECTED Final   Candida tropicalis NOT DETECTED NOT DETECTED Final    Comment: Performed at [facility], [place] [address] [place], [place] [address]  SARS Coronavirus 2 by RT PCR (hospital order, performed in [facility] hospital lab) Nasopharyngeal Nasopharyngeal Swab     Status: None   Collection Time: 08/24/19  9:46 PM   Specimen: Nasopharyngeal Swab  Result Value Ref Range Status   SARS Coronavirus 2 NEGATIVE NEGATIVE Final    Comment: (NOTE) If result is NEGATIVE SARS-CoV-2 target nucleic acids are NOT DETECTED. The SARS-CoV-2 RNA is generally detectable in upper and lower  respiratory specimens during the acute phase of infection. The lowest  concentration of SARS-CoV-2 viral copies this assay can detect is 250  copies / mL. A negative result does not preclude SARS-CoV-2 infection  and should not be used as the sole basis for treatment or other  patient management decisions.  A negative result may occur with   improper specimen collection / handling, submission of specimen other  than nasopharyngeal swab, presence of viral mutation(s) within the  areas targeted by this assay, and inadequate number of viral copies  (<250 copies / mL). A negative result must be combined with clinical  observations, patient history, and epidemiological information. If result is POSITIVE SARS-CoV-2 target nucleic acids are DETECTED. The SARS-CoV-2 RNA is generally detectable in upper and lower  respiratory specimens dur ing the acute phase of infection.  Positive  results are indicative of active infection with SARS-CoV-2.  Clinical  correlation with patient history and other diagnostic information is  necessary to determine patient infection status.  Positive results do  not rule out bacterial infection or co-infection with other viruses. If result is PRESUMPTIVE POSTIVE SARS-CoV-2 nucleic acids MAY BE PRESENT.   A presumptive  positive result was obtained on the submitted specimen  and confirmed on repeat testing.  While 2019 novel coronavirus  (SARS-CoV-2) nucleic acids may be present in the submitted sample  additional confirmatory testing may be necessary for epidemiological  and / or clinical management purposes  to differentiate between  SARS-CoV-2 and other Sarbecovirus currently known to infect humans.  If clinically indicated additional testing with an alternate test  methodology [phone]) is advised. The SARS-CoV-2 RNA is generally  detectable in upper and lower respiratory sp ecimens during the acute  phase of infection. The expected result is Negative. Fact Sheet for Patients:  StrictlyIdeas.no Fact Sheet for Healthcare Providers: BankingDealers.co.za This test is not yet approved or cleared by the [place] FDA and has been authorized for detection and/or diagnosis of SARS-CoV-2 by FDA under an Emergency Use Authorization (EUA).  This EUA will  remain in effect (meaning this test can be used) for the duration of the COVID-19 declaration under Section 564(b)(1) of the Act, 21 U.S.C. section 360bbb-3(b)(1), unless the authorization is terminated or revoked sooner. Performed at [facility], [place] [address] [place], [place] [address]   MRSA PCR Screening     Status: None   Collection Time: 08/25/19  2:11 AM  Result Value Ref Range Status   MRSA by PCR NEGATIVE NEGATIVE Final    Comment:        The GeneXpert MRSA Assay (FDA approved for NASAL specimens only), is one component of a comprehensive MRSA colonization surveillance program. It is not intended to diagnose MRSA infection nor to guide or monitor treatment for MRSA infections. Performed at [facility], [place] [address], [place], [place] [address]   Respiratory Panel by PCR     Status: None   Collection Time: 08/25/19 10:00 AM   Specimen: Flu Kit Nasopharyngeal Swab; Respiratory  Result Value Ref Range Status   Adenovirus NOT DETECTED NOT DETECTED Final   Coronavirus 229E NOT DETECTED NOT DETECTED Final    Comment: (NOTE) The Coronavirus on the Respiratory Panel, DOES NOT test for the novel  Coronavirus (2019 nCoV)    Coronavirus HKU1 NOT DETECTED NOT DETECTED Final   Coronavirus NL63 NOT DETECTED NOT DETECTED Final   Coronavirus OC43 NOT DETECTED NOT DETECTED Final   Metapneumovirus NOT DETECTED NOT DETECTED Final   Rhinovirus / Enterovirus NOT DETECTED NOT DETECTED Final   Influenza A NOT DETECTED NOT DETECTED Final   Influenza B NOT DETECTED NOT DETECTED Final   Parainfluenza Virus 1 NOT DETECTED NOT DETECTED Final   Parainfluenza Virus 2 NOT DETECTED NOT DETECTED Final   Parainfluenza Virus 3 NOT DETECTED NOT DETECTED Final   Parainfluenza Virus 4 NOT DETECTED NOT DETECTED Final   Respiratory Syncytial Virus NOT DETECTED NOT DETECTED Final   Bordetella pertussis NOT DETECTED NOT DETECTED Final   Chlamydophila pneumoniae NOT DETECTED NOT  DETECTED Final   Mycoplasma pneumoniae NOT DETECTED NOT DETECTED Final    Comment: Performed at [facility], [place]. [address] [place], [place] [address]  Culture, blood (routine x 2)     Status: None (Preliminary result)   Collection Time: 08/25/19  7:20 PM   Specimen: BLOOD RIGHT HAND  Result Value Ref Range Status   Specimen Description BLOOD RIGHT HAND  Final   Special Requests IN PEDIATRIC BOTTLE Blood Culture adequate volume  Final   Culture   Final    NO GROWTH 2 DAYS Performed at [facility], [place],  [place] [address]    Report Status PENDING  Incomplete  Culture, blood (routine x 2)     Status: None (Preliminary result)   Collection Time: 08/25/19  7:25 PM   Specimen: BLOOD RIGHT HAND  Result Value Ref Range Status   Specimen Description BLOOD RIGHT HAND  Final   Special Requests   Final    BOTTLES DRAWN AEROBIC ONLY Blood Culture adequate volume   Culture   Final    NO GROWTH 2 DAYS Performed at [facility], [place] [address], [place], [place] [address]    Report Status PENDING  Incomplete    Impression/Plan:  1. Bacteremia - concern for endocarditis with both sets of blood cultures positive.  He is scheduled for a TEE on Monday.  From an ID standpoint, it is reasonable for him to get his TEE as an outpatient on Monday and be discharged now.  He does not need a line and looks clinically good.   If his TEE is negative for a vegetation, he will need 2 weeks of cefazolin with dialysis through 11/16. If he has endocarditis, he will need 4 weeks through 11/30 and referral to cardiology.    2.  Tooth caries - I note on the orthopantogram that it is unclear if there are abscesses or not.  Looks like the findings are negative for abscess.    3.  ESRD - AVG working good so no new line indicated.  OK from ID standpoint though now if needed at some point to place a new line.    Dr. Tommy Medal available over the weekend if needed Otherwise I will  follow up on Monday

## 2019-08-28 NOTE — Care Management Important Message (Signed)
Important Message  Patient Details  Name: Jesus Ayala MRN: [id number] Date of Birth: 1951/03/01   Medicare Important Message Given:  Yes     Shelda Altes 08/28/2019, 2:47 PM

## 2019-08-28 NOTE — Progress Notes (Signed)
    [facility] has been requested to perform a transesophageal echocardiogram on 08/31/2019 for bacteremia.  After careful review of history and examination, the risks and benefits of transesophageal echocardiogram have been explained including risks of esophageal damage, perforation (1:10,000 risk), bleeding, pharyngeal hematoma as well as other potential complications associated with conscious sedation including aspiration, arrhythmia, respiratory failure and death. Alternatives to treatment were discussed, questions were answered. Patient is willing to proceed.   TEE scheduled for 08/31/2019 at 11am with Dr. Britta Mccreedy, PA-C 08/28/2019 12:28 PM

## 2019-08-28 NOTE — Progress Notes (Signed)
Patient ID: LARWENCE DUANE, male   DOB: June 21, 1951, 68 y.o.   MRN: [id number] S: No complaints, feeling well. O:BP (!) 150/84 (BP Location: Right Arm)   Pulse 74   Temp (!) 97.3 F (36.3 C) (Oral)   Resp 17   Ht 5\' 11"  (1.803 m)   Wt 72 kg   SpO2 100%   BMI 22.13 kg/m  No intake or output data in the 24 hours ending 08/28/19 1249 Intake/Output: I/O last 3 completed shifts: In: 100.1 [IV Piggyback:100.1] Out: -   Intake/Output this shift:  No intake/output data recorded. Weight change: -0.714 kg Gen: NAD CVS: no rub Resp: cta Abd: benign Ext: no edema, LAVG +T/B  Recent Labs  Lab 08/24/19 2012 08/25/19 0737 08/26/19 0228 08/27/19 0243 08/28/19 0305  NA 137 137 136 138 136  K 3.8 4.0 4.1 3.7 3.6  CL 96* 98 96* 97* 96*  CO2 29 27 28 28 26   GLUCOSE 122* 93 95 78 85  BUN 16 21 44* 18 28*  CREATININE 6.12* 7.48* 9.78* 5.96* 8.58*  ALBUMIN 4.2  --   --   --   --   CALCIUM 9.2 8.7* 8.8* 9.0 9.1  AST 16  --   --   --   --   ALT 17  --   --   --   --    Liver Function Tests: Recent Labs  Lab 08/24/19 2012  AST 16  ALT 17  ALKPHOS 54  BILITOT 0.5  PROT 7.5  ALBUMIN 4.2   No results for input(s): LIPASE, AMYLASE in the last 168 hours. No results for input(s): AMMONIA in the last 168 hours. CBC: Recent Labs  Lab 08/24/19 2012 08/25/19 0737 08/26/19 1355 08/27/19 0243 08/28/19 0305  WBC 7.3 8.9 5.7 6.5 5.4  NEUTROABS 6.3  --   --   --   --   HGB 10.8* 10.2* 9.9* 9.4* 9.6*  HCT 32.8* 31.3* 29.1* 28.0* 29.4*  MCV 89.6 90.5 89.3 88.6 90.2  PLT 181 163 170 156 192   Cardiac Enzymes: No results for input(s): CKTOTAL, CKMB, CKMBINDEX, TROPONINI in the last 168 hours. CBG: No results for input(s): GLUCAP in the last 168 hours.  Iron Studies: No results for input(s): IRON, TIBC, TRANSFERRIN, FERRITIN in the last 72 hours. Studies/Results: Dg Orthopantogram  Result Date: 08/27/2019 CLINICAL DATA:  Pt is being eval for bacteremia associated with an  intravascular line - pt states he believes he has a broken molar or wisdom tooth, not having any tooth/mouth pain EXAM: ORTHOPANTOGRAM/PANORAMIC COMPARISON:  None. FINDINGS: There is fracture of the crown of tooth 17 surrounding remaining metallic dental filling. Numerous absent teeth. No significant caries. Periapical abscesses are identified. IMPRESSION: Fracture of crown of tooth 17. No periapical abscesses or significant caries. Electronically Signed   By: Nolon Nations M.D.   On: 08/27/2019 12:31   . amLODipine  10 mg Oral Daily  . aspirin  81 mg Oral Daily  . calcitRIOL  0.25 mcg Oral Daily  . calcium acetate  1,334 mg Oral TID WC  . furosemide  80 mg Oral Daily  . heparin  5,000 Units Subcutaneous Q8H  . hydrALAZINE  25 mg Oral TID  . hydrocortisone  1 application Rectal BID  . latanoprost  1 drop Both Eyes QHS  . metoprolol tartrate  100 mg Oral BID  . traZODone  50 mg Oral QHS    BMET    Component Value Date/Time   NA 136  08/28/2019 0305   K 3.6 08/28/2019 0305   CL 96 (L) 08/28/2019 0305   CO2 26 08/28/2019 0305   GLUCOSE 85 08/28/2019 0305   BUN 28 (H) 08/28/2019 0305   CREATININE 8.58 (H) 08/28/2019 0305   CALCIUM 9.1 08/28/2019 0305   CALCIUM 8.7 04/21/2019 0646   GFRNONAA 6 (L) 08/28/2019 0305   GFRAA 7 (L) 08/28/2019 0305   CBC    Component Value Date/Time   WBC 5.4 08/28/2019 0305   RBC 3.26 (L) 08/28/2019 0305   HGB 9.6 (L) 08/28/2019 0305   HCT 29.4 (L) 08/28/2019 0305   PLT 192 08/28/2019 0305   MCV 90.2 08/28/2019 0305   MCH 29.4 08/28/2019 0305   MCHC 32.7 08/28/2019 0305   RDW 16.5 (H) 08/28/2019 0305   LYMPHSABS 0.4 (L) 08/24/2019 2012   MONOABS 0.5 08/24/2019 2012   EOSABS 0.0 08/24/2019 2012   BASOSABS 0.0 08/24/2019 2012    [facility] Time: 4 hours, BFR 400/ DFR 800, EDW 72kg, 180NRe, 3K/ 2.25 Ca, AVG L forearm Mircera 200 mcg q 2 weeks- last dose 08/24/2019 Heparin 2000 unit  bolus Hectorol 1 mcg IV q HD  Assessment/Plan: 1. Streptococcus bacteremia:Presented with fever 102F. Started on empiric vancomycin/cefepime/flagyl. Blood cultures positive for streptococcus and antibiotics narrowed to ceftriaxone. CXR negative. TDC and AVG remain possible sources of infection, however AVG edema/tenderness was previously noted by [facility] to be possible Gore reaction.  1. AVG does not appear infected and TDC being removed today, will send tip for cx 2. Pt also with multiple dental caries but panorex was negative for abscess. 3. For TEE on Monday 2. ESRD:MWF. Will plan for HD today per regular schedule. AVG has been cannulated successfully x4.  TDC is out. 3. Hypertension/volume:BP controlled. Trace edema on exam. Currently 0.2kg above EDW by weights here, UF as tolerated. May need EDW lowered slightly at discharge. 4. Anemia:Hgb 10.2. Not due for ESA. Tsat is low but will hold IV Fe for now due to sepsis. 5. Metabolic bone disease:Calcium 8.8. Continue hectorol. 6. Nutrition:Renal diet with fluid restrictions.   Donetta Potts, MD [employer] [phone]

## 2019-08-28 NOTE — Progress Notes (Signed)
Pharmacy Antibiotic Note  Jesus Ayala is a 68 y.o. male admitted on 08/24/2019 with strep bacteremia.  Pharmacy has been consulted for cefazolin dosing.TEE is pending for Monday. Patient is ESRD with HD MWF. Last dialysis 11/4.   Plan: Cefazolin 2 gm q HD MWF LOT pending TEE Monitor dialysis sessions   Height: 5\' 11"  (180.3 cm) Weight: 158 lb 11.2 oz (72 kg) IBW/kg (Calculated) : 75.3  Temp (24hrs), Avg:98.1 F (36.7 C), Min:98.1 F (36.7 C), Max:98.1 F (36.7 C)  Recent Labs  Lab 08/24/19 2012 08/24/19 2013 08/24/19 2305 08/25/19 0420 08/25/19 0636 08/25/19 0737 08/26/19 0228 08/26/19 1355 08/27/19 0243 08/28/19 0305  WBC 7.3  --   --   --   --  8.9  --  5.7 6.5 5.4  CREATININE 6.12*  --   --   --   --  7.48* 9.78*  --  5.96* 8.58*  LATICACIDVEN  --  1.3 1.7 1.5 0.7  --   --   --   --   --     Estimated Creatinine Clearance: 8.5 mL/min (A) (by C-G formula based on SCr of 8.58 mg/dL (H)).    Allergies  Allergen Reactions  . Ace Inhibitors Swelling and Other (See Comments)  . Enalapril Swelling    ANGIOEDEMA of lips  . Iron Sucrose Itching      Thank you for allowing pharmacy to be a part of this patient's care.  Jimmy Footman, PharmD, BCPS, BCIDP Infectious Diseases Clinical Pharmacist Phone: [phone] 08/28/2019 10:58 AM

## 2019-08-28 NOTE — Plan of Care (Signed)
  Problem: Clinical Measurements: Goal: Respiratory complications will improve Outcome: Progressing   Problem: Elimination: Goal: Will not experience complications related to bowel motility Outcome: Progressing Goal: Will not experience complications related to urinary retention Outcome: Progressing   

## 2019-08-28 NOTE — Progress Notes (Deleted)
[name]  Date of Admission:  08/24/2019      Total days of antibiotics 5  Day 2 cefazolin     ASSESSMENT: Jesus Ayala is a 68 y.o. male with ESRD via HD cath prior to admission found to have strep mitis bacteremia. His HD line has been removed and he improved following appropriate antibiotic therapy. He is now narrowed to cefazolin in preparation to have this given after HD sessions outpatient. Awaiting TEE to eval for endocarditis - this cannot be done until Monday 11/09. From ID perspective he can continue to get antibiotics with HD schedule and possibly be discharged today with instructions on how to present for TEE Monday am if OK with primary team and cardiology. Will defer to Dr. Roger Shelter.    PLAN: 1. Continue cefazolin with duration pending TEE results.   Will look for his TEE results on Monday.  Dr. Tommy Medal is available over the weekend for ID questions.   Principal Problem:   Bacteremia due to other bacteria Active Problems:   HLD (hyperlipidemia)   Hypertension   Normocytic anemia   ESRD (end stage renal disease) (HCC)   SIRS (systemic inflammatory response syndrome) ([place])   . amLODipine  10 mg Oral Daily  . aspirin  81 mg Oral Daily  . calcitRIOL  0.25 mcg Oral Daily  . calcium acetate  1,334 mg Oral TID WC  . furosemide  80 mg Oral Daily  . heparin  5,000 Units Subcutaneous Q8H  . hydrALAZINE  25 mg Oral TID  . hydrocortisone  1 application Rectal BID  . latanoprost  1 drop Both Eyes QHS  . metoprolol tartrate  100 mg Oral BID  . traZODone  50 mg Oral QHS    SUBJECTIVE: Feels good today. No complaints. Not excited about staying until Monday for TEE.   Review of Systems: Review of Systems  Constitutional: Negative for chills and fever.  HENT: Negative for tinnitus.   Eyes: Negative for blurred vision and photophobia.  Respiratory: Negative for cough and sputum production.   Cardiovascular: Negative for  chest pain.  Gastrointestinal: Negative for diarrhea, nausea and vomiting.  Genitourinary: Negative for dysuria.  Skin: Negative for rash.  Neurological: Negative for headaches.    Allergies  Allergen Reactions  . Ace Inhibitors Swelling and Other (See Comments)  . Enalapril Swelling    ANGIOEDEMA of lips  . Iron Sucrose Itching    OBJECTIVE: Vitals:   08/27/19 1939 08/28/19 0509 08/28/19 0821 08/28/19 1155  BP: 132/77 127/73 126/77 (!) 150/84  Pulse: 81 84 84 74  Resp:  (!) _0 Temp: 98.1 F (36.7 C) 98.1 F (36.7 C) 98.1 F (36.7 C) (!) 97.3 F (36.3 C)  TempSrc: Oral Oral Oral Oral  SpO2: 100% 99% 100% 100%  Weight:  72 kg    Height:       Body mass index is 22.13 kg/m.  Physical Exam Constitutional:      Comments: Sitting in recliner in no distress.   Cardiovascular:     Rate and Rhythm: Normal rate and regular rhythm.     Heart sounds: No murmur.  Pulmonary:     Effort: Pulmonary effort is normal.     Breath sounds: Normal breath sounds.  Skin:    General: Skin is warm and dry.     Capillary Refill: Capillary refill takes less than 2 seconds.     Comments: Cath  removal site clean/dry dressings  Neurological:     Mental Status: He is alert and oriented to person, place, and time.  Psychiatric:        Mood and Affect: Mood normal.        Behavior: Behavior normal.     Lab Results Lab Results  Component Value Date   WBC 5.4 08/28/2019   HGB 9.6 (L) 08/28/2019   HCT 29.4 (L) 08/28/2019   MCV 90.2 08/28/2019   PLT 192 08/28/2019    Lab Results  Component Value Date   CREATININE 8.58 (H) 08/28/2019   BUN 28 (H) 08/28/2019   NA 136 08/28/2019   K 3.6 08/28/2019   CL 96 (L) 08/28/2019   CO2 26 08/28/2019    Lab Results  Component Value Date   ALT 17 08/24/2019   AST 16 08/24/2019   ALKPHOS 54 08/24/2019   BILITOT 0.5 08/24/2019     Microbiology: Recent Results (from the past 240 hour(s))  Blood culture (routine x 2)     Status:  Abnormal   Collection Time: 08/24/19  8:17 PM   Specimen: BLOOD RIGHT HAND  Result Value Ref Range Status   Specimen Description BLOOD RIGHT HAND  Final   Special Requests   Final    BOTTLES DRAWN AEROBIC AND ANAEROBIC Blood Culture results may not be optimal due to an excessive volume of blood received in culture bottles   Culture  Setup Time   Final    GRAM POSITIVE COCCI IN CHAINS IN BOTH AEROBIC AND ANAEROBIC BOTTLES    Culture (A)  Final    STREPTOCOCCUS MITIS/ORALIS SUSCEPTIBILITIES PERFORMED ON PREVIOUS CULTURE WITHIN THE LAST 5 DAYS. Performed at [facility], [place] [address] [place], [place] [address]    Report Status 08/27/2019 FINAL  Final  Blood culture (routine x 2)     Status: Abnormal   Collection Time: 08/24/19  8:17 PM   Specimen: BLOOD RIGHT FOREARM  Result Value Ref Range Status   Specimen Description BLOOD RIGHT FOREARM  Final   Special Requests   Final    BOTTLES DRAWN AEROBIC AND ANAEROBIC Blood Culture adequate volume   Culture  Setup Time   Final    GRAM POSITIVE COCCI IN CHAINS IN BOTH AEROBIC AND ANAEROBIC BOTTLES CRITICAL RESULT CALLED TO, READ BACK BY AND VERIFIED WITH: C. WALSTON PHARMD, AT [phone] 08/25/19 BY D. VANHOOK CRITICAL RESULT CALLED TO, READ BACK BY AND VERIFIED WITH: C. WALSTON, PHARMD AT 1300 ON 08/25/19 BY C. JESSUP, MT. Performed at [facility], [place] [address], [place],  [address]    Culture STREPTOCOCCUS MITIS/ORALIS (A)  Final   Report Status 08/27/2019 FINAL  Final   Organism ID, Bacteria STREPTOCOCCUS MITIS/ORALIS  Final      Susceptibility   Streptococcus mitis/oralis - MIC*    TETRACYCLINE 0.5 SENSITIVE Sensitive     VANCOMYCIN <=0.12 SENSITIVE Sensitive     CLINDAMYCIN <=0.25 SENSITIVE Sensitive     PENICILLIN Value in next row Sensitive      SENSITIVE<=0.06    CEFTRIAXONE Value in next row Sensitive      SENSITIVE<=0.12    * STREPTOCOCCUS MITIS/ORALIS  Blood Culture ID Panel (Reflexed)     Status: Abnormal    Collection Time: 08/24/19  8:17 PM  Result Value Ref Range Status   Enterococcus species NOT DETECTED NOT DETECTED Final   Listeria monocytogenes NOT DETECTED NOT DETECTED Final   Staphylococcus species NOT DETECTED NOT DETECTED Final  Staphylococcus aureus (BCID) NOT DETECTED NOT DETECTED Final   Streptococcus species DETECTED (A) NOT DETECTED Final    Comment: Not Enterococcus species, Streptococcus agalactiae, Streptococcus pyogenes, or Streptococcus pneumoniae. CRITICAL RESULT CALLED TO, READ BACK BY AND VERIFIED WITH: C. WALSTON, PHARMD AT 1300 ON 08/25/19 BY C. JESSUP, MT.    Streptococcus agalactiae NOT DETECTED NOT DETECTED Final   Streptococcus pneumoniae NOT DETECTED NOT DETECTED Final   Streptococcus pyogenes NOT DETECTED NOT DETECTED Final   Acinetobacter baumannii NOT DETECTED NOT DETECTED Final   Enterobacteriaceae species NOT DETECTED NOT DETECTED Final   Enterobacter cloacae complex NOT DETECTED NOT DETECTED Final   Escherichia coli NOT DETECTED NOT DETECTED Final   Klebsiella oxytoca NOT DETECTED NOT DETECTED Final   Klebsiella pneumoniae NOT DETECTED NOT DETECTED Final   Proteus species NOT DETECTED NOT DETECTED Final   Serratia marcescens NOT DETECTED NOT DETECTED Final   Haemophilus influenzae NOT DETECTED NOT DETECTED Final   Neisseria meningitidis NOT DETECTED NOT DETECTED Final   Pseudomonas aeruginosa NOT DETECTED NOT DETECTED Final   Candida albicans NOT DETECTED NOT DETECTED Final   Candida glabrata NOT DETECTED NOT DETECTED Final   Candida krusei NOT DETECTED NOT DETECTED Final   Candida parapsilosis NOT DETECTED NOT DETECTED Final   Candida tropicalis NOT DETECTED NOT DETECTED Final    Comment: Performed at [facility], [place] [address], [place], [place] [address]  SARS Coronavirus 2 by RT PCR (hospital order, performed in [facility] hospital lab) Nasopharyngeal Nasopharyngeal Swab     Status: None   Collection Time: 08/24/19  9:46 PM   Specimen:  Nasopharyngeal Swab  Result Value Ref Range Status   SARS Coronavirus 2 NEGATIVE NEGATIVE Final    Comment: (NOTE) If result is NEGATIVE SARS-CoV-2 target nucleic acids are NOT DETECTED. The SARS-CoV-2 RNA is generally detectable in upper and lower  respiratory specimens during the acute phase of infection. The lowest  concentration of SARS-CoV-2 viral copies this assay can detect is 250  copies / mL. A negative result does not preclude SARS-CoV-2 infection  and should not be used as the sole basis for treatment or other  patient management decisions.  A negative result may occur with  improper specimen collection / handling, submission of specimen other  than nasopharyngeal swab, presence of viral mutation(s) within the  areas targeted by this assay, and inadequate number of viral copies  (<250 copies / mL). A negative result must be combined with clinical  observations, patient history, and epidemiological information. If result is POSITIVE SARS-CoV-2 target nucleic acids are DETECTED. The SARS-CoV-2 RNA is generally detectable in upper and lower  respiratory specimens dur ing the acute phase of infection.  Positive  results are indicative of active infection with SARS-CoV-2.  Clinical  correlation with patient history and other diagnostic information is  necessary to determine patient infection status.  Positive results do  not rule out bacterial infection or co-infection with other viruses. If result is PRESUMPTIVE POSTIVE SARS-CoV-2 nucleic acids MAY BE PRESENT.   A presumptive positive result was obtained on the submitted specimen  and confirmed on repeat testing.  While 2019 novel coronavirus  (SARS-CoV-2) nucleic acids may be present in the submitted sample  additional confirmatory testing may be necessary for epidemiological  and / or clinical management purposes  to differentiate between  SARS-CoV-2 and other Sarbecovirus currently known to infect humans.  If clinically  indicated additional testing with an alternate test  methodology [phone]) is advised. The SARS-CoV-2  RNA is generally  detectable in upper and lower respiratory sp ecimens during the acute  phase of infection. The expected result is Negative. Fact Sheet for Patients:  StrictlyIdeas.no Fact Sheet for Healthcare Providers: BankingDealers.co.za This test is not yet approved or cleared by the [place] FDA and has been authorized for detection and/or diagnosis of SARS-CoV-2 by FDA under an Emergency Use Authorization (EUA).  This EUA will remain in effect (meaning this test can be used) for the duration of the COVID-19 declaration under Section 564(b)(1) of the Act, 21 U.S.C. section 360bbb-3(b)(1), unless the authorization is terminated or revoked sooner. Performed at [facility], [place] [address] [place], [place] [address]   MRSA PCR Screening     Status: None   Collection Time: 08/25/19  2:11 AM  Result Value Ref Range Status   MRSA by PCR NEGATIVE NEGATIVE Final    Comment:        The GeneXpert MRSA Assay (FDA approved for NASAL specimens only), is one component of a comprehensive MRSA colonization surveillance program. It is not intended to diagnose MRSA infection nor to guide or monitor treatment for MRSA infections. Performed at [facility], [place] [address] [place], [place] [address]   Respiratory Panel by PCR     Status: None   Collection Time: 08/25/19 10:00 AM   Specimen: Flu Kit Nasopharyngeal Swab; Respiratory  Result Value Ref Range Status   Adenovirus NOT DETECTED NOT DETECTED Final   Coronavirus 229E NOT DETECTED NOT DETECTED Final    Comment: (NOTE) The Coronavirus on the Respiratory Panel, DOES NOT test for the novel  Coronavirus (2019 nCoV)    Coronavirus HKU1 NOT DETECTED NOT DETECTED Final   Coronavirus NL63 NOT DETECTED NOT DETECTED Final   Coronavirus OC43 NOT DETECTED NOT DETECTED Final    Metapneumovirus NOT DETECTED NOT DETECTED Final   Rhinovirus / Enterovirus NOT DETECTED NOT DETECTED Final   Influenza A NOT DETECTED NOT DETECTED Final   Influenza B NOT DETECTED NOT DETECTED Final   Parainfluenza Virus 1 NOT DETECTED NOT DETECTED Final   Parainfluenza Virus 2 NOT DETECTED NOT DETECTED Final   Parainfluenza Virus 3 NOT DETECTED NOT DETECTED Final   Parainfluenza Virus 4 NOT DETECTED NOT DETECTED Final   Respiratory Syncytial Virus NOT DETECTED NOT DETECTED Final   Bordetella pertussis NOT DETECTED NOT DETECTED Final   Chlamydophila pneumoniae NOT DETECTED NOT DETECTED Final   Mycoplasma pneumoniae NOT DETECTED NOT DETECTED Final    Comment: Performed at [facility], [place] [address], [place], [place] [address]  Culture, blood (routine x 2)     Status: None (Preliminary result)   Collection Time: 08/25/19  7:20 PM   Specimen: BLOOD RIGHT HAND  Result Value Ref Range Status   Specimen Description BLOOD RIGHT HAND  Final   Special Requests IN PEDIATRIC BOTTLE Blood Culture adequate volume  Final   Culture   Final    NO GROWTH 2 DAYS Performed at [facility], [place] [address], [place], [place] [address]    Report Status PENDING  Incomplete  Culture, blood (routine x 2)     Status: None (Preliminary result)   Collection Time: 08/25/19  7:25 PM   Specimen: BLOOD RIGHT HAND  Result Value Ref Range Status   Specimen Description BLOOD RIGHT HAND  Final   Special Requests   Final    BOTTLES DRAWN AEROBIC ONLY Blood Culture adequate volume   Culture   Final  NO GROWTH 2 DAYS Performed at [facility], [place] [address], [place], [place] [address]    Report Status PENDING  Incomplete  Cath Tip Culture     Status: None (Preliminary result)   Collection Time: 08/27/19  3:48 PM   Specimen: Catheter Tip; Other  Result Value Ref Range Status   Specimen Description CATH TIP  Final   Special Requests NONE  Final   Culture   Final    NO GROWTH < 24 HOURS  Performed at [facility], [place] [address], [place], [place] [address]    Report Status PENDING  Incomplete     Janene Madeira, MSN, NP-C [facility] [email or web address] Pager: [phone] Office: [phone] [place]: [phone]

## 2019-08-28 NOTE — Progress Notes (Signed)
PROGRESS NOTE    Patient: Jesus Ayala                            PCP: Seward Carol, PA-C                    DOB: 02-25-1951            DOA: 08/24/2019 [id number]             DOS: 08/28/2019, 12:44 PM   LOS: 3 days   Date of Service: The patient was seen and examined on 08/28/2019  Subjective:   The patient was seen and examined this morning, remained stable afebrile normotensive.  Denies any chest pain or shortness of breath.  Denies of any fever or chills. No events overnight. Status post hemodialysis yesterday right chest wall wound dressing in place, right chest wall Port-A-Cath -has been removed   Note: Contacted infectious disease Dr. De Burrs who is agreed the patient needs to stay for continue IV antibiotic treatment of bacteremia till rule out endocarditis. Patient will remain in the hospital till TEE is completed on Monday and course of antibiotics determined.   Brief Narrative:   Jesus Ayala is a 68 y.o. male with a PMH of ESRD on dialysis, anemia, HTN and HLD who presented to the ED on 08/24/2019 with dizziness and fever. Patient reports he finished his dialysis treatment on Monday and felt fine, but developed dizziness and heart palpitations about 30 minutes after he got home. Reports his temp was 102F. He denied any respiratory symptoms on admission and COVID was negative. In the ED, T 102.42F, Pulse 113, BP 164/71. K+ 4.1, BUN 28, Cr 8.15, Hgb 9.4>>10.2 on 08/25/2019. Chest c-ray with no active disease. Patient was started on vancomycin and zosyn. Blood cultures were positive for streptococcus. Antibiotics were narrowed from vancomycin/cefepime/flagyl to ceftriaxone. Patient does have a Donald and a LUE AVG placed 07/14/2019. Per [facility] notes, he did have a possible Gore reaction and recommended waiting until 08/31/2019 before accessing graft, however patient reports his graft has been cannulated 3 times successfully.  He reports no pain, bleeding or drainage from [facility].    Assessment & Plan:   Principal Problem:   Bacteremia due to other bacteria Active Problems:   HLD (hyperlipidemia)   Hypertension   Normocytic anemia   ESRD (end stage renal disease) (HCC)   SIRS (systemic inflammatory response syndrome) (HCC)  SIRS /Fever/possible bacteremia --Remained stable, afebrile normotensive today. -Right chest wall wound dressing in place, right chest wall Port-A-Cath -has been removed  -Present on admission temperature of 102.5 , T-max 100.4  -08/25/2019 1 out of 2 blood culture growing Streptococcus,  -08/25/2019 -repeated blood cultures >>> negative to date -08/25/19 --On admission patient was started on vancomycin / cefepime /Flagyl >>>  antibiotic narrowed down to ceftriaxone IV  -No source of infection at this time identified ???  Port a cath --- which was removed on 08/27/2019 -The patient has AVG  left upper arm -no signs of infection   Concern for endocarditis -Infectious disease following, echo/TEE -Echo was reviewed WNL,  -TEE is scheduled for Monday, 08/31/2019 If negative for valvular vegetation recommending 2 weeks of cefazolin with dialysis through 09/07/2019 If positive for endocarditis 4 weeks till 09/21/2019.    End-stage renal disease -Hemodialysis days M/WF -Patient started on hemodialysis in September 2020, status post left upper extremity AV fistula placement on 07/14/2019, -Nephrologist consulted, due for  hemodialysis today  History of chronic anemia due to chronic disease, end-stage renal disease -H&H stable -Patient has had transfusion in the past due to bleeding from catheter site.   Hyperlipidemia -Continue statins  Hypertension -As patient improves, continue home medication of Lopressor, hydralazine, Norvasc, -Lasix  ?AVG graft infection -Status post left FA loop graft by Dr. Doren Custard 07/14/2019 -TCD placement on 06/25/2019 by Dr. Donzetta Matters  -No signs of overt infection at this time monitoring closely -Appreciate vascular  team input   Cultures; 08/25/2019 blood cultures >> Streptococcus Repeat blood cultures 08/25/2019 >> -Catheter tip culture 08/27/2019 >>>   Antimicrobials: Vancomycin/cefepime/Flagyl >>> 08/25/2019.  08/25/19 IV Rocephin>> 08/27/19 08/27/2019 cefazolin >>>   DVT prophylaxis: SCD/Compression stockings Code Status:   Code Status: Prior Family Communication: No family member present at bedside- attempt will be made to update daily The above findings and plan of care has been discussed with patient in detail,  they expressed understanding and agreement of above. Disposition Plan:   Anticipated 1-2 days Admission status:  NPATIEN -   Consultants: Nephrology  Procedures:   No admission procedures for hospital encounter.   Antimicrobials:  Anti-infectives (From admission, onward)   Start     Dose/Rate Route Frequency Ordered Stop   08/28/19 2000  ceFAZolin (ANCEF) IVPB 2g/100 mL premix     2 g 200 mL/hr over 30 Minutes Intravenous Every M-W-F (2000) 08/28/19 1054     08/27/19 2200  ceFAZolin (ANCEF) IVPB 1 g/50 mL premix  Status:  Discontinued     1 g 100 mL/hr over 30 Minutes Intravenous Every 24 hours 08/27/19 1115 08/28/19 1054   08/26/19 2200  cefTRIAXone (ROCEPHIN) 1 g in sodium chloride 0.9 % 100 mL IVPB  Status:  Discontinued     1 g 200 mL/hr over 30 Minutes Intravenous Daily at bedtime 08/25/19 1454 08/26/19 1202   08/26/19 2200  cefTRIAXone (ROCEPHIN) 2 g in sodium chloride 0.9 % 100 mL IVPB  Status:  Discontinued     2 g 200 mL/hr over 30 Minutes Intravenous Daily at bedtime 08/26/19 1202 08/27/19 1115   08/26/19 2000  ceFEPIme (MAXIPIME) 2 g in sodium chloride 0.9 % 100 mL IVPB  Status:  Discontinued     2 g 200 mL/hr over 30 Minutes Intravenous Every M-W-F (2000) 08/25/19 0346 08/25/19 1445   08/26/19 1200  vancomycin (VANCOCIN) IVPB 750 mg/150 ml premix  Status:  Discontinued     750 mg 150 mL/hr over 60 Minutes Intravenous Every M-W-F (Hemodialysis) 08/25/19 0346  08/25/19 1445   08/25/19 1600  cefTRIAXone (ROCEPHIN) 1 g in dextrose 5 % 50 mL IVPB  Status:  Discontinued     1 g 120 mL/hr over 30 Minutes Intravenous Every 24 hours 08/25/19 1445 08/25/19 1454   08/25/19 1600  cefTRIAXone (ROCEPHIN) 2 g in sodium chloride 0.9 % 100 mL IVPB     2 g 200 mL/hr over 30 Minutes Intravenous  Once 08/25/19 1454 08/25/19 1903   08/25/19 0800  metroNIDAZOLE (FLAGYL) IVPB 500 mg  Status:  Discontinued     500 mg 100 mL/hr over 60 Minutes Intravenous Every 8 hours 08/25/19 0337 08/25/19 1445   08/24/19 2345  piperacillin-tazobactam (ZOSYN) IVPB 3.375 g     3.375 g 100 mL/hr over 30 Minutes Intravenous  Once 08/24/19 2330 08/25/19 0119   08/24/19 2330  vancomycin (VANCOCIN) 1,250 mg in sodium chloride 0.9 % 250 mL IVPB     1,250 mg 166.7 mL/hr over 90 Minutes Intravenous  Once 08/24/19  2329 08/25/19 0150   08/24/19 2330  piperacillin-tazobactam (ZOSYN) IVPB 4.5 g  Status:  Discontinued     4.5 g 133.3 mL/hr over 30 Minutes Intravenous  Once 08/24/19 2329 08/24/19 2330       Medication:  . amLODipine  10 mg Oral Daily  . aspirin  81 mg Oral Daily  . calcitRIOL  0.25 mcg Oral Daily  . calcium acetate  1,334 mg Oral TID WC  . furosemide  80 mg Oral Daily  . heparin  5,000 Units Subcutaneous Q8H  . hydrALAZINE  25 mg Oral TID  . hydrocortisone  1 application Rectal BID  . latanoprost  1 drop Both Eyes QHS  . metoprolol tartrate  100 mg Oral BID  . traZODone  50 mg Oral QHS    acetaminophen, lidocaine, nitroGLYCERIN, ondansetron (ZOFRAN) IV   Objective:   Vitals:   08/27/19 1939 08/28/19 0509 08/28/19 0821 08/28/19 1155  BP: 132/77 127/73 126/77 (!) 150/84  Pulse: 81 84 84 74  Resp:  (!) 24 20 17   Temp: 98.1 F (36.7 C) 98.1 F (36.7 C) 98.1 F (36.7 C) (!) 97.3 F (36.3 C)  TempSrc: Oral Oral Oral Oral  SpO2: 100% 99% 100% 100%  Weight:  72 kg    Height:       No intake or output data in the 24 hours ending 08/28/19 1244 Filed Weights    08/26/19 1609 08/27/19 0500 08/28/19 0509  Weight: 71 kg 71.2 kg 72 kg     Examination:    BP (!) 150/84 (BP Location: Right Arm)   Pulse 74   Temp (!) 97.3 F (36.3 C) (Oral)   Resp 17   Ht 5\' 11"  (1.803 m)   Wt 72 kg   SpO2 100%   BMI 22.13 kg/m    Physical Exam  Constitution:  Alert, cooperative, no distress,  Psychiatric: Normal and stable mood and affect, cognition intact,   HEENT: Normocephalic, PERRL, otherwise with in Normal limits  Chest:Chest symmetric Cardio vascular:  S1/S2, RRR, No murmure, No Rubs or Gallops  pulmonary: Clear to auscultation bilaterally, respirations unlabored, negative wheezes / crackles Abdomen: Soft, non-tender, non-distended, bowel sounds,no masses, no organomegaly Muscular skeletal: Limited exam - in bed, able to move all 4 extremities, Normal strength,  Neuro: CNII-XII intact. , normal motor and sensation, reflexes intact  Extremities: No pitting edema lower extremities, +2 pulses  Skin: Dry, warm to touch, negative for any Rashes, No open wounds Wounds: per nursing documentation  right chest wall wound dressing in place, right chest wall Port-A-Cath -has been removed left extremity AV fistula, no signs of erythema edema tenderness or drainage      LABs:  CBC Latest Ref Rng & Units 08/28/2019 08/27/2019 08/26/2019  WBC 4.0 - 10.5 K/uL 5.4 6.5 5.7  Hemoglobin 13.0 - 17.0 g/dL 9.6(L) 9.4(L) 9.9(L)  Hematocrit 39.0 - 52.0 % 29.4(L) 28.0(L) 29.1(L)  Platelets 150 - 400 K/uL 192 156 170   CMP Latest Ref Rng & Units 08/28/2019 08/27/2019 08/26/2019  Glucose 70 - 99 mg/dL 85 78 95  BUN 8 - 23 mg/dL 28(H) 18 44(H)  Creatinine 0.61 - 1.24 mg/dL 8.58(H) 5.96(H) 9.78(H)  Sodium 135 - 145 mmol/L 136 138 136  Potassium 3.5 - 5.1 mmol/L 3.6 3.7 4.1  Chloride 98 - 111 mmol/L 96(L) 97(L) 96(L)  CO2 22 - 32 mmol/L 26 28 28   Calcium 8.9 - 10.3 mg/dL 9.1 9.0 8.8(L)  Total Protein 6.5 - 8.1 g/dL - - -  Total Bilirubin 0.3 - 1.2 mg/dL - - -  Alkaline  Phos 38 - 126 U/L - - -  AST 15 - 41 U/L - - -  ALT 0 - 44 U/L - - -        SIGNED: Deatra James, MD, FACP, FHM. [facility],  Pager [phone]  If 7PM-7AM, please contact night-coverage [email or web address] [email or web address] [facility] 08/28/2019, 12:44 PM

## 2019-08-29 ENCOUNTER — Other Ambulatory Visit: Payer: Self-pay

## 2019-08-29 LAB — BASIC METABOLIC PANEL
Anion gap: 15 (ref 5–15)
BUN: 36 mg/dL — ABNORMAL HIGH (ref 8–23)
CO2: 24 mmol/L (ref 22–32)
Calcium: 9 mg/dL (ref 8.9–10.3)
Chloride: 97 mmol/L — ABNORMAL LOW (ref 98–111)
Creatinine, Ser: 10.36 mg/dL — ABNORMAL HIGH (ref 0.61–1.24)
GFR calc Af Amer: 5 mL/min — ABNORMAL LOW (ref >60)
GFR calc non Af Amer: 5 mL/min — ABNORMAL LOW (ref >60)
Glucose, Bld: 81 mg/dL (ref 70–99)
Potassium: 3.7 mmol/L (ref 3.5–5.1)
Sodium: 136 mmol/L (ref 135–145)

## 2019-08-29 LAB — CATH TIP CULTURE: Culture: NO GROWTH

## 2019-08-29 LAB — CBC
HCT: 28.2 % — ABNORMAL LOW (ref 39.0–52.0)
Hemoglobin: 9.4 g/dL — ABNORMAL LOW (ref 13.0–17.0)
MCH: 29.2 pg (ref 26.0–34.0)
MCHC: 33.3 g/dL (ref 30.0–36.0)
MCV: 87.6 fL (ref 80.0–100.0)
Platelets: 198 10*3/uL (ref 150–400)
RBC: 3.22 MIL/uL — ABNORMAL LOW (ref 4.22–5.81)
RDW: 16.2 % — ABNORMAL HIGH (ref 11.5–15.5)
WBC: 5.3 10*3/uL (ref 4.0–10.5)
nRBC: 0 % (ref 0.0–0.2)

## 2019-08-29 NOTE — Progress Notes (Signed)
PROGRESS NOTE    Jesus Ayala  PYY:[id number] DOB: 05-04-1951 DOA: 08/24/2019 PCP: Rudy Jew    Brief Narrative:  68 year old dialysis TTS schedule, anemia, hypertension, hyperlipidemia presented on 08/24/2019 with dizziness and fever.  On arrival to the ER, he was febrile with temperature 102.5.  Patient started on broad-spectrum antibiotics and admitted.  Blood cultures positive for Streptococcus.  Antibiotic deescalated. Dialysis catheter removed from right anterior chest.  Left upper extremity AVG was placed in 07/14/2019 that has been used now.   Assessment & Plan:   Principal Problem:   Bacteremia due to other bacteria Active Problems:   HLD (hyperlipidemia)   Hypertension   Normocytic anemia   ESRD (end stage renal disease) (HCC)   SIRS (systemic inflammatory response syndrome) ([place])  Sepsis with streptococcal bacteremia present on admission: Sepsis resolved. Primary source unknown.  Paronex did not show any abscess. Right chest permacath removed, repeat cultures negative. Previously on broad-spectrum antibiotics.  Currently on cefazolin with hemodialysis. TEE planned for 08/31/2019, antibiotic duration will be decided after TEE as per ID recommendations. Patient clinically improving.  Anemia of chronic disease: Stable.  Hyperlipidemia: On statin stable.  Hypertension: Stable.   DVT prophylaxis: SCDs Code Status: Full code Family Communication: Wife at the bedside Disposition Plan: Home after TEE   Consultants:   Cardiology for TEE  Nephrology  Procedures:   None  Antimicrobials:   Vancomycin and Zosyn,  Cefazolin, 08/27/2019>>>>   Subjective: Patient was seen and examined.  No overnight events.  Came back from hemodialysis.  Wife was at the bedside.  Afebrile.  Objective: Vitals:   08/29/19 0930 08/29/19 1000 08/29/19 1030 08/29/19 1055  BP: 122/76 120/73 128/79 136/86  Pulse: 75 78 81 82  Resp:    15  Temp:    98.6 F (37 C)   TempSrc:    Oral  SpO2:    99%  Weight:    70.9 kg  Height:        Intake/Output Summary (Last 24 hours) at 08/29/2019 1456 Last data filed at 08/29/2019 1040 Gross per 24 hour  Intake 100 ml  Output 1505 ml  Net -1405 ml   Filed Weights   08/28/19 0509 08/29/19 0709 08/29/19 1055  Weight: 72 kg 72.9 kg 70.9 kg    Examination:  General exam: Appears comfortable on room air. Respiratory system: Clear to auscultation. Respiratory effort normal.  No added sounds. Cardiovascular system: S1 & S2 heard, RRR. No JVD, murmurs, rubs, gallops or clicks.  Trace bilateral pedal edema right more than left. Gastrointestinal system: Abdomen is nondistended, soft and nontender.  Central nervous system: Alert and oriented. No focal neurological deficits. Extremities: Symmetric 5 x 5 power. Skin: No rashes, lesions or ulcers Psychiatry: Judgement and insight appear normal. Mood & affect appropriate.  Right chest wall incision clean and dry. Left forearm AV graft with dressing, just deaccessed.   Data Reviewed: I have personally reviewed following labs and imaging studies  CBC: Recent Labs  Lab 08/24/19 2012 08/25/19 0737 08/26/19 1355 08/27/19 0243 08/28/19 0305 08/29/19 0246  WBC 7.3 8.9 5.7 6.5 5.4 5.3  NEUTROABS 6.3  --   --   --   --   --   HGB 10.8* 10.2* 9.9* 9.4* 9.6* 9.4*  HCT 32.8* 31.3* 29.1* 28.0* 29.4* 28.2*  MCV 89.6 90.5 89.3 88.6 90.2 87.6  PLT 181 163 170 156 192 356   Basic Metabolic Panel: Recent Labs  Lab 08/25/19 0737 08/26/19 0228 08/27/19 0243  08/28/19 0305 08/29/19 0246  NA 137 136 138 136 136  K 4.0 4.1 3.7 3.6 3.7  CL 98 96* 97* 96* 97*  CO2 '27 28 28 26 24  ' GLUCOSE 93 95 78 85 81  BUN 21 44* 18 28* 36*  CREATININE 7.48* 9.78* 5.96* 8.58* 10.36*  CALCIUM 8.7* 8.8* 9.0 9.1 9.0   GFR: Estimated Creatinine Clearance: 6.9 mL/min (A) (by C-G formula based on SCr of 10.36 mg/dL (H)). Liver Function Tests: Recent Labs  Lab 08/24/19 2012  AST 16   ALT 17  ALKPHOS 54  BILITOT 0.5  PROT 7.5  ALBUMIN 4.2   No results for input(s): LIPASE, AMYLASE in the last 168 hours. No results for input(s): AMMONIA in the last 168 hours. Coagulation Profile: Recent Labs  Lab 08/25/19 0420  INR 1.1   Cardiac Enzymes: No results for input(s): CKTOTAL, CKMB, CKMBINDEX, TROPONINI in the last 168 hours. BNP (last 3 results) No results for input(s): PROBNP in the last 8760 hours. HbA1C: No results for input(s): HGBA1C in the last 72 hours. CBG: No results for input(s): GLUCAP in the last 168 hours. Lipid Profile: No results for input(s): CHOL, HDL, LDLCALC, TRIG, CHOLHDL, LDLDIRECT in the last 72 hours. Thyroid Function Tests: No results for input(s): TSH, T4TOTAL, FREET4, T3FREE, THYROIDAB in the last 72 hours. Anemia Panel: No results for input(s): VITAMINB12, FOLATE, FERRITIN, TIBC, IRON, RETICCTPCT in the last 72 hours. Sepsis Labs: Recent Labs  Lab 08/24/19 2013 08/24/19 2305 08/25/19 0212 08/25/19 0420 08/25/19 0636 08/26/19 0228 08/27/19 0243  PROCALCITON  --   --  4.03 4.86  --  6.94 6.12  LATICACIDVEN 1.3 1.7  --  1.5 0.7  --   --     Recent Results (from the past 240 hour(s))  Blood culture (routine x 2)     Status: Abnormal   Collection Time: 08/24/19  8:17 PM   Specimen: BLOOD RIGHT HAND  Result Value Ref Range Status   Specimen Description BLOOD RIGHT HAND  Final   Special Requests   Final    BOTTLES DRAWN AEROBIC AND ANAEROBIC Blood Culture results may not be optimal due to an excessive volume of blood received in culture bottles   Culture  Setup Time   Final    GRAM POSITIVE COCCI IN CHAINS IN BOTH AEROBIC AND ANAEROBIC BOTTLES    Culture (A)  Final    STREPTOCOCCUS MITIS/ORALIS SUSCEPTIBILITIES PERFORMED ON PREVIOUS CULTURE WITHIN THE LAST 5 DAYS. Performed at [facility], [place] [address], [place], [place] [address]    Report Status 08/27/2019 FINAL  Final  Blood culture (routine x 2)     Status:  Abnormal   Collection Time: 08/24/19  8:17 PM   Specimen: BLOOD RIGHT FOREARM  Result Value Ref Range Status   Specimen Description BLOOD RIGHT FOREARM  Final   Special Requests   Final    BOTTLES DRAWN AEROBIC AND ANAEROBIC Blood Culture adequate volume   Culture  Setup Time   Final    GRAM POSITIVE COCCI IN CHAINS IN BOTH AEROBIC AND ANAEROBIC BOTTLES CRITICAL RESULT CALLED TO, READ BACK BY AND VERIFIED WITH: C. WALSTON PHARMD, AT 5621 08/25/19 BY D. VANHOOK CRITICAL RESULT CALLED TO, READ BACK BY AND VERIFIED WITH: C. WALSTON, PHARMD AT 1300 ON 08/25/19 BY C. JESSUP, MT. Performed at [facility], [place] [address], [place], [place] [address]    Culture STREPTOCOCCUS MITIS/ORALIS (A)  Final   Report Status 08/27/2019 FINAL  Final  Organism ID, Bacteria STREPTOCOCCUS MITIS/ORALIS  Final      Susceptibility   Streptococcus mitis/oralis - MIC*    TETRACYCLINE 0.5 SENSITIVE Sensitive     VANCOMYCIN <=0.12 SENSITIVE Sensitive     CLINDAMYCIN <=0.25 SENSITIVE Sensitive     PENICILLIN Value in next row Sensitive      SENSITIVE<=0.06    CEFTRIAXONE Value in next row Sensitive      SENSITIVE<=0.12    * STREPTOCOCCUS MITIS/ORALIS  Blood Culture ID Panel (Reflexed)     Status: Abnormal   Collection Time: 08/24/19  8:17 PM  Result Value Ref Range Status   Enterococcus species NOT DETECTED NOT DETECTED Final   Listeria monocytogenes NOT DETECTED NOT DETECTED Final   Staphylococcus species NOT DETECTED NOT DETECTED Final   Staphylococcus aureus (BCID) NOT DETECTED NOT DETECTED Final   Streptococcus species DETECTED (A) NOT DETECTED Final    Comment: Not Enterococcus species, Streptococcus agalactiae, Streptococcus pyogenes, or Streptococcus pneumoniae. CRITICAL RESULT CALLED TO, READ BACK BY AND VERIFIED WITH: C. WALSTON, PHARMD AT 1300 ON 08/25/19 BY C. JESSUP, MT.    Streptococcus agalactiae NOT DETECTED NOT DETECTED Final   Streptococcus pneumoniae NOT DETECTED NOT DETECTED Final    Streptococcus pyogenes NOT DETECTED NOT DETECTED Final   Acinetobacter baumannii NOT DETECTED NOT DETECTED Final   Enterobacteriaceae species NOT DETECTED NOT DETECTED Final   Enterobacter cloacae complex NOT DETECTED NOT DETECTED Final   Escherichia coli NOT DETECTED NOT DETECTED Final   Klebsiella oxytoca NOT DETECTED NOT DETECTED Final   Klebsiella pneumoniae NOT DETECTED NOT DETECTED Final   Proteus species NOT DETECTED NOT DETECTED Final   Serratia marcescens NOT DETECTED NOT DETECTED Final   Haemophilus influenzae NOT DETECTED NOT DETECTED Final   Neisseria meningitidis NOT DETECTED NOT DETECTED Final   Pseudomonas aeruginosa NOT DETECTED NOT DETECTED Final   Candida albicans NOT DETECTED NOT DETECTED Final   Candida glabrata NOT DETECTED NOT DETECTED Final   Candida krusei NOT DETECTED NOT DETECTED Final   Candida parapsilosis NOT DETECTED NOT DETECTED Final   Candida tropicalis NOT DETECTED NOT DETECTED Final    Comment: Performed at [facility], [place] [address] [place], [place] [address]  SARS Coronavirus 2 by RT PCR (hospital order, performed in [facility] hospital lab) Nasopharyngeal Nasopharyngeal Swab     Status: None   Collection Time: 08/24/19  9:46 PM   Specimen: Nasopharyngeal Swab  Result Value Ref Range Status   SARS Coronavirus 2 NEGATIVE NEGATIVE Final    Comment: (NOTE) If result is NEGATIVE SARS-CoV-2 target nucleic acids are NOT DETECTED. The SARS-CoV-2 RNA is generally detectable in upper and lower  respiratory specimens during the acute phase of infection. The lowest  concentration of SARS-CoV-2 viral copies this assay can detect is 250  copies / mL. A negative result does not preclude SARS-CoV-2 infection  and should not be used as the sole basis for treatment or other  patient management decisions.  A negative result may occur with  improper specimen collection / handling, submission of specimen other  than nasopharyngeal swab, presence of viral  mutation(s) within the  areas targeted by this assay, and inadequate number of viral copies  (<250 copies / mL). A negative result must be combined with clinical  observations, patient history, and epidemiological information. If result is POSITIVE SARS-CoV-2 target nucleic acids are DETECTED. The SARS-CoV-2 RNA is generally detectable in upper and lower  respiratory specimens dur ing the acute phase of infection.  Positive  results are indicative of active infection with SARS-CoV-2.  Clinical  correlation with patient history and other diagnostic information is  necessary to determine patient infection status.  Positive results do  not rule out bacterial infection or co-infection with other viruses. If result is PRESUMPTIVE POSTIVE SARS-CoV-2 nucleic acids MAY BE PRESENT.   A presumptive positive result was obtained on the submitted specimen  and confirmed on repeat testing.  While 2019 novel coronavirus  (SARS-CoV-2) nucleic acids may be present in the submitted sample  additional confirmatory testing may be necessary for epidemiological  and / or clinical management purposes  to differentiate between  SARS-CoV-2 and other Sarbecovirus currently known to infect humans.  If clinically indicated additional testing with an alternate test  methodology [phone]) is advised. The SARS-CoV-2 RNA is generally  detectable in upper and lower respiratory sp ecimens during the acute  phase of infection. The expected result is Negative. Fact Sheet for Patients:  StrictlyIdeas.no Fact Sheet for Healthcare Providers: BankingDealers.co.za This test is not yet approved or cleared by the [place] FDA and has been authorized for detection and/or diagnosis of SARS-CoV-2 by FDA under an Emergency Use Authorization (EUA).  This EUA will remain in effect (meaning this test can be used) for the duration of the COVID-19 declaration under Section 564(b)(1)  of the Act, 21 U.S.C. section 360bbb-3(b)(1), unless the authorization is terminated or revoked sooner. Performed at [facility], [place] [address], [place], [place] [address]   MRSA PCR Screening     Status: None   Collection Time: 08/25/19  2:11 AM  Result Value Ref Range Status   MRSA by PCR NEGATIVE NEGATIVE Final    Comment:        The GeneXpert MRSA Assay (FDA approved for NASAL specimens only), is one component of a comprehensive MRSA colonization surveillance program. It is not intended to diagnose MRSA infection nor to guide or monitor treatment for MRSA infections. Performed at [facility], [place] [address] [place], [place] [address]   Respiratory Panel by PCR     Status: None   Collection Time: 08/25/19 10:00 AM   Specimen: Flu Kit Nasopharyngeal Swab; Respiratory  Result Value Ref Range Status   Adenovirus NOT DETECTED NOT DETECTED Final   Coronavirus 229E NOT DETECTED NOT DETECTED Final    Comment: (NOTE) The Coronavirus on the Respiratory Panel, DOES NOT test for the novel  Coronavirus (2019 nCoV)    Coronavirus HKU1 NOT DETECTED NOT DETECTED Final   Coronavirus NL63 NOT DETECTED NOT DETECTED Final   Coronavirus OC43 NOT DETECTED NOT DETECTED Final   Metapneumovirus NOT DETECTED NOT DETECTED Final   Rhinovirus / Enterovirus NOT DETECTED NOT DETECTED Final   Influenza A NOT DETECTED NOT DETECTED Final   Influenza B NOT DETECTED NOT DETECTED Final   Parainfluenza Virus 1 NOT DETECTED NOT DETECTED Final   Parainfluenza Virus 2 NOT DETECTED NOT DETECTED Final   Parainfluenza Virus 3 NOT DETECTED NOT DETECTED Final   Parainfluenza Virus 4 NOT DETECTED NOT DETECTED Final   Respiratory Syncytial Virus NOT DETECTED NOT DETECTED Final   Bordetella pertussis NOT DETECTED NOT DETECTED Final   Chlamydophila pneumoniae NOT DETECTED NOT DETECTED Final   Mycoplasma pneumoniae NOT DETECTED NOT DETECTED Final    Comment: Performed at [facility], [place]. [address], [place],  [address]  Culture, blood (routine x 2)     Status: None (Preliminary result)   Collection Time: 08/25/19  7:20 PM  Specimen: BLOOD RIGHT HAND  Result Value Ref Range Status   Specimen Description BLOOD RIGHT HAND  Final   Special Requests IN PEDIATRIC BOTTLE Blood Culture adequate volume  Final   Culture   Final    NO GROWTH 4 DAYS Performed at [facility], [address] [place], [place] [address]    Report Status PENDING  Incomplete  Culture, blood (routine x 2)     Status: None (Preliminary result)   Collection Time: 08/25/19  7:25 PM   Specimen: BLOOD RIGHT HAND  Result Value Ref Range Status   Specimen Description BLOOD RIGHT HAND  Final   Special Requests   Final    BOTTLES DRAWN AEROBIC ONLY Blood Culture adequate volume   Culture   Final    NO GROWTH 4 DAYS Performed at [facility], [place] [address] [place], [place] [address]    Report Status PENDING  Incomplete  Cath Tip Culture     Status: None   Collection Time: 08/27/19  3:48 PM   Specimen: Catheter Tip; Other  Result Value Ref Range Status   Specimen Description CATH TIP  Final   Special Requests NONE  Final   Culture   Final    NO GROWTH 2 DAYS Performed at [facility], [address] [place], [place] [address]    Report Status 08/29/2019 FINAL  Final         Radiology Studies: No results found.      Scheduled Meds: . amLODipine  10 mg Oral Daily  . aspirin  81 mg Oral Daily  . calcitRIOL  0.25 mcg Oral Daily  . calcium acetate  1,334 mg Oral TID WC  . furosemide  80 mg Oral Daily  . heparin  5,000 Units Subcutaneous Q8H  . hydrALAZINE  25 mg Oral TID  . hydrocortisone  1 application Rectal BID  . latanoprost  1 drop Both Eyes QHS  . metoprolol tartrate  100 mg Oral BID  . traZODone  50 mg Oral QHS   Continuous Infusions: .  ceFAZolin (ANCEF) IV 2 g (08/28/19 2115)     LOS: 4 days    Time spent: 25 minutes    Barb Merino, MD [facility] Pager [phone]

## 2019-08-29 NOTE — Progress Notes (Addendum)
Patient ID: Jesus Ayala, male   DOB: 1951-05-14, 68 y.o.   MRN: [id number]   S: Seen on HD. UF goal 2L. No complaints. Denies f, c, n,v, cp, sob. Will be in hospital until TEE Monday.    O:BP 113/72   Pulse 76   Temp 98.4 F (36.9 C) (Oral)   Resp 12   Ht 5\' 11"  (1.803 m)   Wt 72.9 kg   SpO2 99%   BMI 22.42 kg/m   Intake/Output Summary (Last 24 hours) at 08/29/2019 0952 Last data filed at 08/28/2019 2359 Gross per 24 hour  Intake 100 ml  Output -  Net 100 ml   Intake/Output: I/O last 3 completed shifts: In: 100 [IV Piggyback:100] Out: -   Intake/Output this shift:  No intake/output data recorded. Weight change:  Gen: NAD CVS: no rub Resp: cta Abd: benign Ext: no edema, LAVG +T/B  Recent Labs  Lab 08/24/19 2012 08/25/19 0737 08/26/19 0228 08/27/19 0243 08/28/19 0305 08/29/19 0246  NA 137 137 136 138 136 136  K 3.8 4.0 4.1 3.7 3.6 3.7  CL 96* 98 96* 97* 96* 97*  CO2 29 27 28 28 26 24   GLUCOSE 122* 93 95 78 85 81  BUN 16 21 44* 18 28* 36*  CREATININE 6.12* 7.48* 9.78* 5.96* 8.58* 10.36*  ALBUMIN 4.2  --   --   --   --   --   CALCIUM 9.2 8.7* 8.8* 9.0 9.1 9.0  AST 16  --   --   --   --   --   ALT 17  --   --   --   --   --    Liver Function Tests: Recent Labs  Lab 08/24/19 2012  AST 16  ALT 17  ALKPHOS 54  BILITOT 0.5  PROT 7.5  ALBUMIN 4.2   No results for input(s): LIPASE, AMYLASE in the last 168 hours. No results for input(s): AMMONIA in the last 168 hours. CBC: Recent Labs  Lab 08/24/19 2012 08/25/19 0737 08/26/19 1355 08/27/19 0243 08/28/19 0305 08/29/19 0246  WBC 7.3 8.9 5.7 6.5 5.4 5.3  NEUTROABS 6.3  --   --   --   --   --   HGB 10.8* 10.2* 9.9* 9.4* 9.6* 9.4*  HCT 32.8* 31.3* 29.1* 28.0* 29.4* 28.2*  MCV 89.6 90.5 89.3 88.6 90.2 87.6  PLT 181 163 170 156 192 198   Cardiac Enzymes: No results for input(s): CKTOTAL, CKMB, CKMBINDEX, TROPONINI in the last 168 hours. CBG: No results for input(s): GLUCAP in the last 168  hours.  Iron Studies: No results for input(s): IRON, TIBC, TRANSFERRIN, FERRITIN in the last 72 hours. Studies/Results: Dg Orthopantogram  Result Date: 08/27/2019 CLINICAL DATA:  Pt is being eval for bacteremia associated with an intravascular line - pt states he believes he has a broken molar or wisdom tooth, not having any tooth/mouth pain EXAM: ORTHOPANTOGRAM/PANORAMIC COMPARISON:  None. FINDINGS: There is fracture of the crown of tooth 17 surrounding remaining metallic dental filling. Numerous absent teeth. No significant caries. Periapical abscesses are identified. IMPRESSION: Fracture of crown of tooth 17. No periapical abscesses or significant caries. Electronically Signed   By: Nolon Nations M.D.   On: 08/27/2019 12:31   . amLODipine  10 mg Oral Daily  . aspirin  81 mg Oral Daily  . calcitRIOL  0.25 mcg Oral Daily  . calcium acetate  1,334 mg Oral TID WC  . furosemide  80 mg Oral Daily  .  heparin  5,000 Units Subcutaneous Q8H  . hydrALAZINE  25 mg Oral TID  . hydrocortisone  1 application Rectal BID  . latanoprost  1 drop Both Eyes QHS  . metoprolol tartrate  100 mg Oral BID  . traZODone  50 mg Oral QHS    BMET    Component Value Date/Time   NA 136 08/29/2019 0246   K 3.7 08/29/2019 0246   CL 97 (L) 08/29/2019 0246   CO2 24 08/29/2019 0246   GLUCOSE 81 08/29/2019 0246   BUN 36 (H) 08/29/2019 0246   CREATININE 10.36 (H) 08/29/2019 0246   CALCIUM 9.0 08/29/2019 0246   CALCIUM 8.7 04/21/2019 0646   GFRNONAA 5 (L) 08/29/2019 0246   GFRAA 5 (L) 08/29/2019 0246   CBC    Component Value Date/Time   WBC 5.3 08/29/2019 0246   RBC 3.22 (L) 08/29/2019 0246   HGB 9.4 (L) 08/29/2019 0246   HCT 28.2 (L) 08/29/2019 0246   PLT 198 08/29/2019 0246   MCV 87.6 08/29/2019 0246   MCH 29.2 08/29/2019 0246   MCHC 33.3 08/29/2019 0246   RDW 16.2 (H) 08/29/2019 0246   LYMPHSABS 0.4 (L) 08/24/2019 2012   MONOABS 0.5 08/24/2019 2012   EOSABS 0.0 08/24/2019 2012   BASOSABS 0.0  08/24/2019 2012    [facility] Time: 4 hours, BFR 400/ DFR 800, EDW 72kg, 180NRe, 3K/ 2.25 Ca, AVG L forearm Mircera 200 mcg q 2 weeks- last dose 08/24/2019 Heparin 2000 unit bolus Hectorol 1 mcg IV q HD  Assessment/Plan: Streptococcus bacteremia: Initial BC +strep mitis. Repeat BC negative to date. TDC removed 11/5 -- cath tip cultures neg to date.  CXR negative. Concern for endocarditis -Plan for TEE Monday per ID. If negative for vegetation will need 2 weeks cefazolin through 11/16 , if positive will need 4 weeks, through 11/30    ESRD:MWF. HD off schedule today. Next HD 11/9.  AVG has been cannulated successfully x4.  TDC is out.  Hypertension/volume:BP controlled. Trace edema on exam. Currently 0.2kg above EDW by weights here, UF as tolerated.  Anemia:On ESA as outpatient.  Not due for ESA. Tsat is low but will hold IV Fe for now due to sepsis.  Metabolic bone disease:Continue hectorol/binders.    Lynnda Child PA-C [facility] Pager [phone] 08/29/2019,9:52 AM  Pt seen, examined and agree w A/P as above.  Kelly Splinter  MD 08/29/2019, 1:31 PM

## 2019-08-30 DIAGNOSIS — B9689 Other specified bacterial agents as the cause of diseases classified elsewhere: Secondary | ICD-10-CM

## 2019-08-30 LAB — CBC
HCT: 29.7 % — ABNORMAL LOW (ref 39.0–52.0)
Hemoglobin: 9.5 g/dL — ABNORMAL LOW (ref 13.0–17.0)
MCH: 29.1 pg (ref 26.0–34.0)
MCHC: 32 g/dL (ref 30.0–36.0)
MCV: 90.8 fL (ref 80.0–100.0)
Platelets: 213 10*3/uL (ref 150–400)
RBC: 3.27 MIL/uL — ABNORMAL LOW (ref 4.22–5.81)
RDW: 16.8 % — ABNORMAL HIGH (ref 11.5–15.5)
WBC: 5.4 10*3/uL (ref 4.0–10.5)
nRBC: 0.4 % — ABNORMAL HIGH (ref 0.0–0.2)

## 2019-08-30 LAB — CULTURE, BLOOD (ROUTINE X 2)
Culture: NO GROWTH
Culture: NO GROWTH
Special Requests: ADEQUATE
Special Requests: ADEQUATE

## 2019-08-30 LAB — BASIC METABOLIC PANEL
Anion gap: 12 (ref 5–15)
BUN: 26 mg/dL — ABNORMAL HIGH (ref 8–23)
CO2: 25 mmol/L (ref 22–32)
Calcium: 9 mg/dL (ref 8.9–10.3)
Chloride: 100 mmol/L (ref 98–111)
Creatinine, Ser: 7.84 mg/dL — ABNORMAL HIGH (ref 0.61–1.24)
GFR calc Af Amer: 7 mL/min — ABNORMAL LOW (ref >60)
GFR calc non Af Amer: 6 mL/min — ABNORMAL LOW (ref >60)
Glucose, Bld: 88 mg/dL (ref 70–99)
Potassium: 4.3 mmol/L (ref 3.5–5.1)
Sodium: 137 mmol/L (ref 135–145)

## 2019-08-30 NOTE — Progress Notes (Signed)
PROGRESS NOTE  Jesus Ayala:[id number] DOB: October 13, 1951 DOA: 08/24/2019 PCP: Seward Carol, PA-C  HPI/Recap of past 2 hours: 68 year old dialysis TTS schedule, anemia, hypertension, hyperlipidemia presented on 08/24/2019 with dizziness and fever.  On arrival to the ER, he was febrile with temperature 102.5.  Patient started on broad-spectrum antibiotics and admitted.  Blood cultures positive for Streptococcus.  Antibiotic deescalated. Dialysis catheter removed from right anterior chest.  Left upper extremity AVG was placed in 07/14/2019 that has been used now.  08/30/19: Patient was seen and examined at his bedside this morning.  No acute events overnight.  He has no new complaints.  He denies any chest pain or dyspnea or palpitations.  No night sweats or chills.  Plan for TEE 08/31/2019 to rule out endocarditis.  Currently on Ancef for Streptococcus bacteremia.  Assessment/Plan: Principal Problem:   Bacteremia due to other bacteria Active Problems:   HLD (hyperlipidemia)   Hypertension   Normocytic anemia   ESRD (end stage renal disease) (HCC)   SIRS (systemic inflammatory response syndrome) ([place])  Sepsis with streptococcal bacteremia present on admission:  Sepsis physiology is resolving.  TTE done on 08/27/2019 no valvular vegetation was visualized.  TEE planned on 08/31/2019.  Antibiotics duration will be decided after TEE and as per ID recommendations.  Continue Ancef.  Repeated blood cultures drawn 08/25/2019 - to date.  Catheter tip culture done on 08/27/2019 - to date. Primary source unknown.  Right chest permacath removed, repeat cultures negative. Previously on broad-spectrum antibiotics.  Currently on cefazolin with hemodialysis.  ESRD on HD TTS ESRD on HD since September 2020. Last HD was on Saturday, 08/29/2019 Management per nephrology  Anemia of chronic disease secondary to ESRD: Hemoglobin stable 9.5 from 9.4 previously.  Management per nephrology.   Hyperlipidemia: On statin stable.  Hypertension: Stable.  Blood pressure is at goal.   DVT prophylaxis:  Subcu heparin 5000 3 times daily Code Status: Full code Family Communication:  none at bedside. Disposition Plan: Home after TEE   Consultants:   Cardiology for TEE  Nephrology  Procedures:   None  Antimicrobials:   Vancomycin and Zosyn,  Cefazolin, 08/27/2019>>>>     Objective: Vitals:   08/29/19 1030 08/29/19 1055 08/29/19 2029 08/30/19 0559  BP: 128/79 136/86 109/65 112/78  Pulse: 81 82 86 75  Resp:  _0 Temp:  98.6 F (37 C) 98.3 F (36.8 C) 98 F (36.7 C)  TempSrc:  Oral Oral Oral  SpO2:  99% 99% 99%  Weight:  70.9 kg    Height:        Intake/Output Summary (Last 24 hours) at 08/30/2019 1003 Last data filed at 08/29/2019 1040 Gross per 24 hour  Intake -  Output 1505 ml  Net -1505 ml   Filed Weights   08/28/19 0509 08/29/19 0709 08/29/19 1055  Weight: 72 kg 72.9 kg 70.9 kg    Exam:  . General: 68 y.o. year-old male well developed well nourished in no acute distress.  Alert and oriented x4. . Cardiovascular: Regular rate and rhythm with no rubs or gallops.  No thyromegaly or JVD noted.   [name] Respiratory: Clear to auscultation with no wheezes or rales. Good inspiratory effort. . Abdomen: Soft nontender nondistended with normal bowel sounds x4 quadrants. . Musculoskeletal: No lower extremity edema. 2/4 pulses in all 4 extremities. [name] Psychiatry: Mood is appropriate for condition and setting   Data Reviewed: CBC: Recent Labs  Lab 08/24/19 2012  08/26/19 1355 08/27/19 0243  08/28/19 0305 08/29/19 0246 08/30/19 0320  WBC 7.3   < > 5.7 6.5 5.4 5.3 5.4  NEUTROABS 6.3  --   --   --   --   --   --   HGB 10.8*   < > 9.9* 9.4* 9.6* 9.4* 9.5*  HCT 32.8*   < > 29.1* 28.0* 29.4* 28.2* 29.7*  MCV 89.6   < > 89.3 88.6 90.2 87.6 90.8  PLT 181   < > 170 156 192 198 213   < > = values in this interval not displayed.   Basic Metabolic  Panel: Recent Labs  Lab 08/26/19 0228 08/27/19 0243 08/28/19 0305 08/29/19 0246 08/30/19 0320  NA 136 138 136 136 137  K 4.1 3.7 3.6 3.7 4.3  CL 96* 97* 96* 97* 100  CO2 _0 GLUCOSE 95 78 85 81 88  BUN 44* 18 28* 36* 26*  CREATININE 9.78* 5.96* 8.58* 10.36* 7.84*  CALCIUM 8.8* 9.0 9.1 9.0 9.0   GFR: Estimated Creatinine Clearance: 9.2 mL/min (A) (by C-G formula based on SCr of 7.84 mg/dL (H)). Liver Function Tests: Recent Labs  Lab 08/24/19 2012  AST 16  ALT 17  ALKPHOS 54  BILITOT 0.5  PROT 7.5  ALBUMIN 4.2   No results for input(s): LIPASE, AMYLASE in the last 168 hours. No results for input(s): AMMONIA in the last 168 hours. Coagulation Profile: Recent Labs  Lab 08/25/19 0420  INR 1.1   Cardiac Enzymes: No results for input(s): CKTOTAL, CKMB, CKMBINDEX, TROPONINI in the last 168 hours. BNP (last 3 results) No results for input(s): PROBNP in the last 8760 hours. HbA1C: No results for input(s): HGBA1C in the last 72 hours. CBG: No results for input(s): GLUCAP in the last 168 hours. Lipid Profile: No results for input(s): CHOL, HDL, LDLCALC, TRIG, CHOLHDL, LDLDIRECT in the last 72 hours. Thyroid Function Tests: No results for input(s): TSH, T4TOTAL, FREET4, T3FREE, THYROIDAB in the last 72 hours. Anemia Panel: No results for input(s): VITAMINB12, FOLATE, FERRITIN, TIBC, IRON, RETICCTPCT in the last 72 hours. Urine analysis:    Component Value Date/Time   COLORURINE YELLOW 08/25/2019 1540   APPEARANCEUR CLEAR 08/25/2019 1540   LABSPEC 1.011 08/25/2019 1540   PHURINE 8.0 08/25/2019 1540   GLUCOSEU NEGATIVE 08/25/2019 1540   HGBUR NEGATIVE 08/25/2019 1540   BILIRUBINUR NEGATIVE 08/25/2019 1540   KETONESUR NEGATIVE 08/25/2019 1540   PROTEINUR >=300 (A) 08/25/2019 1540   UROBILINOGEN 1.0 12/12/2009 0201   NITRITE NEGATIVE 08/25/2019 1540   LEUKOCYTESUR NEGATIVE 08/25/2019 1540   Sepsis Labs: _1 (procalcitonin:4,lacticidven:4)  )  Recent Results (from the past 240 hour(s))  Blood culture (routine x 2)     Status: Abnormal   Collection Time: 08/24/19  8:17 PM   Specimen: BLOOD RIGHT HAND  Result Value Ref Range Status   Specimen Description BLOOD RIGHT HAND  Final   Special Requests   Final    BOTTLES DRAWN AEROBIC AND ANAEROBIC Blood Culture results may not be optimal due to an excessive volume of blood received in culture bottles   Culture  Setup Time   Final    GRAM POSITIVE COCCI IN CHAINS IN BOTH AEROBIC AND ANAEROBIC BOTTLES    Culture (A)  Final    STREPTOCOCCUS MITIS/ORALIS SUSCEPTIBILITIES PERFORMED ON PREVIOUS CULTURE WITHIN THE LAST 5 DAYS. Performed at [facility], [place] [address], [place], [place] [address]    Report Status 08/27/2019 FINAL  Final  Blood culture (routine x  2)     Status: Abnormal   Collection Time: 08/24/19  8:17 PM   Specimen: BLOOD RIGHT FOREARM  Result Value Ref Range Status   Specimen Description BLOOD RIGHT FOREARM  Final   Special Requests   Final    BOTTLES DRAWN AEROBIC AND ANAEROBIC Blood Culture adequate volume   Culture  Setup Time   Final    GRAM POSITIVE COCCI IN CHAINS IN BOTH AEROBIC AND ANAEROBIC BOTTLES CRITICAL RESULT CALLED TO, READ BACK BY AND VERIFIED WITH: C. WALSTON PHARMD, AT 2426 08/25/19 BY D. VANHOOK CRITICAL RESULT CALLED TO, READ BACK BY AND VERIFIED WITH: C. WALSTON, PHARMD AT 1300 ON 08/25/19 BY C. JESSUP, MT. Performed at [facility], [place] [address], [place], [place] [address]    Culture STREPTOCOCCUS MITIS/ORALIS (A)  Final   Report Status 08/27/2019 FINAL  Final   Organism ID, Bacteria STREPTOCOCCUS MITIS/ORALIS  Final      Susceptibility   Streptococcus mitis/oralis - MIC*    TETRACYCLINE 0.5 SENSITIVE Sensitive     VANCOMYCIN <=0.12 SENSITIVE Sensitive     CLINDAMYCIN <=0.25 SENSITIVE Sensitive     PENICILLIN Value in next row Sensitive      SENSITIVE<=0.06    CEFTRIAXONE Value in next row Sensitive      SENSITIVE<=0.12     * STREPTOCOCCUS MITIS/ORALIS  Blood Culture ID Panel (Reflexed)     Status: Abnormal   Collection Time: 08/24/19  8:17 PM  Result Value Ref Range Status   Enterococcus species NOT DETECTED NOT DETECTED Final   Listeria monocytogenes NOT DETECTED NOT DETECTED Final   Staphylococcus species NOT DETECTED NOT DETECTED Final   Staphylococcus aureus (BCID) NOT DETECTED NOT DETECTED Final   Streptococcus species DETECTED (A) NOT DETECTED Final    Comment: Not Enterococcus species, Streptococcus agalactiae, Streptococcus pyogenes, or Streptococcus pneumoniae. CRITICAL RESULT CALLED TO, READ BACK BY AND VERIFIED WITH: C. WALSTON, PHARMD AT 1300 ON 08/25/19 BY C. JESSUP, MT.    Streptococcus agalactiae NOT DETECTED NOT DETECTED Final   Streptococcus pneumoniae NOT DETECTED NOT DETECTED Final   Streptococcus pyogenes NOT DETECTED NOT DETECTED Final   Acinetobacter baumannii NOT DETECTED NOT DETECTED Final   Enterobacteriaceae species NOT DETECTED NOT DETECTED Final   Enterobacter cloacae complex NOT DETECTED NOT DETECTED Final   Escherichia coli NOT DETECTED NOT DETECTED Final   Klebsiella oxytoca NOT DETECTED NOT DETECTED Final   Klebsiella pneumoniae NOT DETECTED NOT DETECTED Final   Proteus species NOT DETECTED NOT DETECTED Final   Serratia marcescens NOT DETECTED NOT DETECTED Final   Haemophilus influenzae NOT DETECTED NOT DETECTED Final   Neisseria meningitidis NOT DETECTED NOT DETECTED Final   Pseudomonas aeruginosa NOT DETECTED NOT DETECTED Final   Candida albicans NOT DETECTED NOT DETECTED Final   Candida glabrata NOT DETECTED NOT DETECTED Final   Candida krusei NOT DETECTED NOT DETECTED Final   Candida parapsilosis NOT DETECTED NOT DETECTED Final   Candida tropicalis NOT DETECTED NOT DETECTED Final    Comment: Performed at [facility], [place] [address] [place], [place] [address]  SARS Coronavirus 2 by RT PCR (hospital order, performed in [facility] hospital lab) Nasopharyngeal  Nasopharyngeal Swab     Status: None   Collection Time: 08/24/19  9:46 PM   Specimen: Nasopharyngeal Swab  Result Value Ref Range Status   SARS Coronavirus 2 NEGATIVE NEGATIVE Final    Comment: (NOTE) If result is NEGATIVE SARS-CoV-2 target nucleic acids are NOT DETECTED. The SARS-CoV-2 RNA is generally  detectable in upper and lower  respiratory specimens during the acute phase of infection. The lowest  concentration of SARS-CoV-2 viral copies this assay can detect is 250  copies / mL. A negative result does not preclude SARS-CoV-2 infection  and should not be used as the sole basis for treatment or other  patient management decisions.  A negative result may occur with  improper specimen collection / handling, submission of specimen other  than nasopharyngeal swab, presence of viral mutation(s) within the  areas targeted by this assay, and inadequate number of viral copies  (<250 copies / mL). A negative result must be combined with clinical  observations, patient history, and epidemiological information. If result is POSITIVE SARS-CoV-2 target nucleic acids are DETECTED. The SARS-CoV-2 RNA is generally detectable in upper and lower  respiratory specimens dur ing the acute phase of infection.  Positive  results are indicative of active infection with SARS-CoV-2.  Clinical  correlation with patient history and other diagnostic information is  necessary to determine patient infection status.  Positive results do  not rule out bacterial infection or co-infection with other viruses. If result is PRESUMPTIVE POSTIVE SARS-CoV-2 nucleic acids MAY BE PRESENT.   A presumptive positive result was obtained on the submitted specimen  and confirmed on repeat testing.  While 2019 novel coronavirus  (SARS-CoV-2) nucleic acids may be present in the submitted sample  additional confirmatory testing may be necessary for epidemiological  and / or clinical management purposes  to differentiate between   SARS-CoV-2 and other Sarbecovirus currently known to infect humans.  If clinically indicated additional testing with an alternate test  methodology [phone]) is advised. The SARS-CoV-2 RNA is generally  detectable in upper and lower respiratory sp ecimens during the acute  phase of infection. The expected result is Negative. Fact Sheet for Patients:  StrictlyIdeas.no Fact Sheet for Healthcare Providers: BankingDealers.co.za This test is not yet approved or cleared by the [place] FDA and has been authorized for detection and/or diagnosis of SARS-CoV-2 by FDA under an Emergency Use Authorization (EUA).  This EUA will remain in effect (meaning this test can be used) for the duration of the COVID-19 declaration under Section 564(b)(1) of the Act, 21 U.S.C. section 360bbb-3(b)(1), unless the authorization is terminated or revoked sooner. Performed at [facility], [place] [address] [place], [place] [address]   MRSA PCR Screening     Status: None   Collection Time: 08/25/19  2:11 AM  Result Value Ref Range Status   MRSA by PCR NEGATIVE NEGATIVE Final    Comment:        The GeneXpert MRSA Assay (FDA approved for NASAL specimens only), is one component of a comprehensive MRSA colonization surveillance program. It is not intended to diagnose MRSA infection nor to guide or monitor treatment for MRSA infections. Performed at [facility], [place] [address] [place], [place] [address]   Respiratory Panel by PCR     Status: None   Collection Time: 08/25/19 10:00 AM   Specimen: Flu Kit Nasopharyngeal Swab; Respiratory  Result Value Ref Range Status   Adenovirus NOT DETECTED NOT DETECTED Final   Coronavirus 229E NOT DETECTED NOT DETECTED Final    Comment: (NOTE) The Coronavirus on the Respiratory Panel, DOES NOT test for the novel  Coronavirus (2019 nCoV)    Coronavirus HKU1 NOT DETECTED NOT DETECTED Final   Coronavirus NL63  NOT DETECTED NOT DETECTED Final   Coronavirus OC43 NOT DETECTED NOT DETECTED Final   Metapneumovirus  NOT DETECTED NOT DETECTED Final   Rhinovirus / Enterovirus NOT DETECTED NOT DETECTED Final   Influenza A NOT DETECTED NOT DETECTED Final   Influenza B NOT DETECTED NOT DETECTED Final   Parainfluenza Virus 1 NOT DETECTED NOT DETECTED Final   Parainfluenza Virus 2 NOT DETECTED NOT DETECTED Final   Parainfluenza Virus 3 NOT DETECTED NOT DETECTED Final   Parainfluenza Virus 4 NOT DETECTED NOT DETECTED Final   Respiratory Syncytial Virus NOT DETECTED NOT DETECTED Final   Bordetella pertussis NOT DETECTED NOT DETECTED Final   Chlamydophila pneumoniae NOT DETECTED NOT DETECTED Final   Mycoplasma pneumoniae NOT DETECTED NOT DETECTED Final    Comment: Performed at [facility], [place] [address] [place], [place] [address]  Culture, blood (routine x 2)     Status: None   Collection Time: 08/25/19  7:20 PM   Specimen: BLOOD RIGHT HAND  Result Value Ref Range Status   Specimen Description BLOOD RIGHT HAND  Final   Special Requests IN PEDIATRIC BOTTLE Blood Culture adequate volume  Final   Culture   Final    NO GROWTH 5 DAYS Performed at [facility], [place] [address], [place], [place] [address]    Report Status 08/30/2019 FINAL  Final  Culture, blood (routine x 2)     Status: None   Collection Time: 08/25/19  7:25 PM   Specimen: BLOOD RIGHT HAND  Result Value Ref Range Status   Specimen Description BLOOD RIGHT HAND  Final   Special Requests   Final    BOTTLES DRAWN AEROBIC ONLY Blood Culture adequate volume   Culture   Final    NO GROWTH 5 DAYS Performed at [facility], [place] [address] [place], [place] [address]    Report Status 08/30/2019 FINAL  Final  Cath Tip Culture     Status: None   Collection Time: 08/27/19  3:48 PM   Specimen: Catheter Tip; Other  Result Value Ref Range Status   Specimen Description CATH TIP  Final   Special Requests NONE  Final   Culture   Final     NO GROWTH 2 DAYS Performed at [facility], [address] [place], [place] [address]    Report Status 08/29/2019 FINAL  Final      Studies: No results found.  Scheduled Meds: . amLODipine  10 mg Oral Daily  . aspirin  81 mg Oral Daily  . calcitRIOL  0.25 mcg Oral Daily  . calcium acetate  1,334 mg Oral TID WC  . furosemide  80 mg Oral Daily  . heparin  5,000 Units Subcutaneous Q8H  . hydrALAZINE  25 mg Oral TID  . hydrocortisone  1 application Rectal BID  . latanoprost  1 drop Both Eyes QHS  . metoprolol tartrate  100 mg Oral BID  . traZODone  50 mg Oral QHS    Continuous Infusions: .  ceFAZolin (ANCEF) IV 2 g (08/28/19 2115)     LOS: 5 days     Kayleen Memos, MD [facility] Pager [phone]  If 7PM-7AM, please contact night-coverage [email or web address] Password [email or web address] 08/30/2019, 10:03 AM

## 2019-08-30 NOTE — Progress Notes (Addendum)
Patient ID: Jesus Ayala, male   DOB: Dec 05, 1950, 68 y.o.   MRN: [id number]   S: Seen in room. Completed HD yesterday with 1.5L removed. No complaints today. Was up walking halls. Denies CP, SOB, N,V,D.     O:BP 112/78 (BP Location: Right Arm)   Pulse 75   Temp 98 F (36.7 C) (Oral)   Resp 16   Ht 5\' 11"  (1.803 m)   Wt 70.9 kg Comment: stood to scale   SpO2 99%   BMI 21.80 kg/m  No intake or output data in the 24 hours ending 08/30/19 1353 Intake/Output: I/O last 3 completed shifts: In: 100 [IV Piggyback:100] Out: [id number]  Intake/Output this shift:  No intake/output data recorded. Weight change:    Gen: NAD CVS: no rub Resp: cta Abd: benign Ext: trace LE  edema, LAVG +T/B  Recent Labs  Lab 08/24/19 2012 08/25/19 0737 08/26/19 0228 08/27/19 0243 08/28/19 0305 08/29/19 0246 08/30/19 0320  NA 137 137 136 138 136 136 137  K 3.8 4.0 4.1 3.7 3.6 3.7 4.3  CL 96* 98 96* 97* 96* 97* 100  CO2 29 27 28 28 26 24 25   GLUCOSE 122* 93 95 78 85 81 88  BUN 16 21 44* 18 28* 36* 26*  CREATININE 6.12* 7.48* 9.78* 5.96* 8.58* 10.36* 7.84*  ALBUMIN 4.2  --   --   --   --   --   --   CALCIUM 9.2 8.7* 8.8* 9.0 9.1 9.0 9.0  AST 16  --   --   --   --   --   --   ALT 17  --   --   --   --   --   --    Liver Function Tests: Recent Labs  Lab 08/24/19 2012  AST 16  ALT 17  ALKPHOS 54  BILITOT 0.5  PROT 7.5  ALBUMIN 4.2   No results for input(s): LIPASE, AMYLASE in the last 168 hours. No results for input(s): AMMONIA in the last 168 hours. CBC: Recent Labs  Lab 08/24/19 2012  08/26/19 1355 08/27/19 0243 08/28/19 0305 08/29/19 0246 08/30/19 0320  WBC 7.3   < > 5.7 6.5 5.4 5.3 5.4  NEUTROABS 6.3  --   --   --   --   --   --   HGB 10.8*   < > 9.9* 9.4* 9.6* 9.4* 9.5*  HCT 32.8*   < > 29.1* 28.0* 29.4* 28.2* 29.7*  MCV 89.6   < > 89.3 88.6 90.2 87.6 90.8  PLT 181   < > 170 156 192 198 213   < > = values in this interval not displayed.   Cardiac Enzymes: No  results for input(s): CKTOTAL, CKMB, CKMBINDEX, TROPONINI in the last 168 hours. CBG: No results for input(s): GLUCAP in the last 168 hours.  Iron Studies: No results for input(s): IRON, TIBC, TRANSFERRIN, FERRITIN in the last 72 hours. Studies/Results: No results found. [name] amLODipine  10 mg Oral Daily  . aspirin  81 mg Oral Daily  . calcitRIOL  0.25 mcg Oral Daily  . calcium acetate  1,334 mg Oral TID WC  . furosemide  80 mg Oral Daily  . heparin  5,000 Units Subcutaneous Q8H  . hydrALAZINE  25 mg Oral TID  . hydrocortisone  1 application Rectal BID  . latanoprost  1 drop Both Eyes QHS  . metoprolol tartrate  100 mg Oral BID  . traZODone  50 mg  Oral QHS    BMET    Component Value Date/Time   NA 137 08/30/2019 0320   K 4.3 08/30/2019 0320   CL 100 08/30/2019 0320   CO2 25 08/30/2019 0320   GLUCOSE 88 08/30/2019 0320   BUN 26 (H) 08/30/2019 0320   CREATININE 7.84 (H) 08/30/2019 0320   CALCIUM 9.0 08/30/2019 0320   CALCIUM 8.7 04/21/2019 0646   GFRNONAA 6 (L) 08/30/2019 0320   GFRAA 7 (L) 08/30/2019 0320   CBC    Component Value Date/Time   WBC 5.4 08/30/2019 0320   RBC 3.27 (L) 08/30/2019 0320   HGB 9.5 (L) 08/30/2019 0320   HCT 29.7 (L) 08/30/2019 0320   PLT 213 08/30/2019 0320   MCV 90.8 08/30/2019 0320   MCH 29.1 08/30/2019 0320   MCHC 32.0 08/30/2019 0320   RDW 16.8 (H) 08/30/2019 0320   LYMPHSABS 0.4 (L) 08/24/2019 2012   MONOABS 0.5 08/24/2019 2012   EOSABS 0.0 08/24/2019 2012   BASOSABS 0.0 08/24/2019 2012    [facility] Time: 4 hours, BFR 400/ DFR 800, EDW 72kg, 180NRe, 3K/ 2.25 Ca, AVG L forearm Mircera 200 mcg q 2 weeks- last dose 08/24/2019 Heparin 2000 unit bolus Hectorol 1 mcg IV q HD  Assessment/Plan: Streptococcus bacteremia: Initial BC +strep mitis. Repeat BC negative to date. TDC removed 11/5 -- cath tip cultures neg to date.  CXR negative. Concern for endocarditis -Plan for TEE Monday per  ID. If negative for vegetation will need 2 weeks cefazolin through 11/16 , if positive will need 4 weeks, through 11/30    ESRD:HD MWF. Back on schedule.  Next HD 11/9.  AVG has been cannulated successfully x4.  TDC is out.  Hypertension/volume:BP controlled. Trace edema on exam. Below EDW by weights here, Will have lower at discharge.   Anemia:On ESA as outpatient.  Not due for ESA. Tsat is low but will hold IV Fe for now due to sepsis.  Metabolic bone disease:Continue hectorol/binders.    Lynnda Child PA-C [facility] Pager [phone] 08/30/2019,1:53 PM  Pt seen, examined and agree w assess/plan as above with additions as indicated. HD tomorrow, schedule around TEE.  Post TEE should be ready for dc.  [facility] 08/30/2019, 3:11 PM

## 2019-08-31 ENCOUNTER — Encounter (HOSPITAL_COMMUNITY): Payer: Self-pay

## 2019-08-31 ENCOUNTER — Encounter (HOSPITAL_COMMUNITY)
Admission: EM | Disposition: A | Discharge status: Home / Self Care | Payer: Self-pay | Source: Home / Self Care | Attending: Family Medicine

## 2019-08-31 ENCOUNTER — Inpatient Hospital Stay (HOSPITAL_COMMUNITY): Payer: Medicare Other | Admitting: Certified Registered Nurse Anesthetist

## 2019-08-31 ENCOUNTER — Inpatient Hospital Stay (HOSPITAL_COMMUNITY): Payer: Medicare Other

## 2019-08-31 DIAGNOSIS — R7881 Bacteremia: Secondary | ICD-10-CM

## 2019-08-31 HISTORY — PX: TEE WITHOUT CARDIOVERSION: SHX5443

## 2019-08-31 LAB — CBC
HCT: 28.6 % — ABNORMAL LOW (ref 39.0–52.0)
Hemoglobin: 9.2 g/dL — ABNORMAL LOW (ref 13.0–17.0)
MCH: 29.3 pg (ref 26.0–34.0)
MCHC: 32.2 g/dL (ref 30.0–36.0)
MCV: 91.1 fL (ref 80.0–100.0)
Platelets: 227 10*3/uL (ref 150–400)
RBC: 3.14 MIL/uL — ABNORMAL LOW (ref 4.22–5.81)
RDW: 17.2 % — ABNORMAL HIGH (ref 11.5–15.5)
WBC: 6.8 10*3/uL (ref 4.0–10.5)
nRBC: 0 % (ref 0.0–0.2)

## 2019-08-31 LAB — BASIC METABOLIC PANEL
Anion gap: 13 (ref 5–15)
BUN: 35 mg/dL — ABNORMAL HIGH (ref 8–23)
CO2: 23 mmol/L (ref 22–32)
Calcium: 9.1 mg/dL (ref 8.9–10.3)
Chloride: 100 mmol/L (ref 98–111)
Creatinine, Ser: 10.08 mg/dL — ABNORMAL HIGH (ref 0.61–1.24)
GFR calc Af Amer: 6 mL/min — ABNORMAL LOW (ref >60)
GFR calc non Af Amer: 5 mL/min — ABNORMAL LOW (ref >60)
Glucose, Bld: 82 mg/dL (ref 70–99)
Potassium: 4.2 mmol/L (ref 3.5–5.1)
Sodium: 136 mmol/L (ref 135–145)

## 2019-08-31 SURGERY — ECHOCARDIOGRAM, TRANSESOPHAGEAL
Anesthesia: General

## 2019-08-31 MED ORDER — PROPOFOL 500 MG/50ML IV EMUL
INTRAVENOUS | Status: DC | PRN
  Administered 2019-08-31: 75 ug/kg/min via INTRAVENOUS

## 2019-08-31 MED ORDER — PROPOFOL 10 MG/ML IV BOLUS
INTRAVENOUS | Status: DC | PRN
  Administered 2019-08-31 (×2): 20 mg via INTRAVENOUS
  Administered 2019-08-31: 10 mg via INTRAVENOUS

## 2019-08-31 MED ORDER — SODIUM CHLORIDE 0.9 % IV SOLN
INTRAVENOUS | Status: DC
  Administered 2019-08-31: 10:00:00 via INTRAVENOUS

## 2019-08-31 MED ORDER — LIDOCAINE 2% (20 MG/ML) 5 ML SYRINGE
INTRAMUSCULAR | Status: DC | PRN
  Administered 2019-08-31: 40 mg via INTRAVENOUS

## 2019-08-31 NOTE — Progress Notes (Signed)
Jesus Ayala for Infectious Disease   Reason for visit: Follow up on bacteremia  Interval History: now s/p TEE and is negative for vegetation.  WBC wnl, afebrile. No new complaints.  No associated rash or diarrhea.    Physical Exam: Constitutional:  Vitals:   08/31/19 1156 08/31/19 1215  BP: (!) 147/67 139/74  Pulse: 75 71  Resp: 16   Temp:  97.9 F (36.6 C)  SpO2: 100% 100%   patient appears in NAD Respiratory: Normal respiratory effort; CTA B Cardiovascular: RRR GI: soft, nt, nd Skin: no rashes  Review of Systems: Constitutional: negative for fevers and chills Gastrointestinal: negative for nausea and diarrhea   Lab Results  Component Value Date   WBC 6.8 08/31/2019   HGB 9.2 (L) 08/31/2019   HCT 28.6 (L) 08/31/2019   MCV 91.1 08/31/2019   PLT 227 08/31/2019    Lab Results  Component Value Date   CREATININE 10.08 (H) 08/31/2019   BUN 35 (H) 08/31/2019   NA 136 08/31/2019   K 4.2 08/31/2019   CL 100 08/31/2019   CO2 23 08/31/2019    Lab Results  Component Value Date   ALT 17 08/24/2019   AST 16 08/24/2019   ALKPHOS 54 08/24/2019     Microbiology: Recent Results (from the past 240 hour(s))  Blood culture (routine x 2)     Status: Abnormal   Collection Time: 08/24/19  8:17 PM   Specimen: BLOOD RIGHT HAND  Result Value Ref Range Status   Specimen Description BLOOD RIGHT HAND  Final   Special Requests   Final    BOTTLES DRAWN AEROBIC AND ANAEROBIC Blood Culture results may not be optimal due to an excessive volume of blood received in culture bottles   Culture  Setup Time   Final    GRAM POSITIVE COCCI IN CHAINS IN BOTH AEROBIC AND ANAEROBIC BOTTLES    Culture (A)  Final    STREPTOCOCCUS MITIS/ORALIS SUSCEPTIBILITIES PERFORMED ON PREVIOUS CULTURE WITHIN THE LAST 5 DAYS. Performed at [facility], [place] [address], [place], [place] [address]    Report Status 08/27/2019 FINAL  Final  Blood culture (routine x 2)     Status: Abnormal   Collection Time: 08/24/19  8:17 PM   Specimen: BLOOD RIGHT FOREARM  Result Value Ref Range Status   Specimen Description BLOOD RIGHT FOREARM  Final   Special Requests   Final    BOTTLES DRAWN AEROBIC AND ANAEROBIC Blood Culture adequate volume   Culture  Setup Time   Final    GRAM POSITIVE COCCI IN CHAINS IN BOTH AEROBIC AND ANAEROBIC BOTTLES CRITICAL RESULT CALLED TO, READ BACK BY AND VERIFIED WITH: C. WALSTON PHARMD, AT [id number] 08/25/19 BY D. VANHOOK CRITICAL RESULT CALLED TO, READ BACK BY AND VERIFIED WITH: C. WALSTON, PHARMD AT 1300 ON 08/25/19 BY C. JESSUP, MT. Performed at [facility], [place] [address] [place], [place] [address]    Culture STREPTOCOCCUS MITIS/ORALIS (A)  Final   Report Status 08/27/2019 FINAL  Final   Organism ID, Bacteria STREPTOCOCCUS MITIS/ORALIS  Final      Susceptibility   Streptococcus mitis/oralis - MIC*    TETRACYCLINE 0.5 SENSITIVE Sensitive     VANCOMYCIN <=0.12 SENSITIVE Sensitive     CLINDAMYCIN <=0.25 SENSITIVE Sensitive     PENICILLIN Value in next row Sensitive      SENSITIVE<=0.06    CEFTRIAXONE Value in next row Sensitive      SENSITIVE<=0.12    *  STREPTOCOCCUS MITIS/ORALIS  Blood Culture ID Panel (Reflexed)     Status: Abnormal   Collection Time: 08/24/19  8:17 PM  Result Value Ref Range Status   Enterococcus species NOT DETECTED NOT DETECTED Final   Listeria monocytogenes NOT DETECTED NOT DETECTED Final   Staphylococcus species NOT DETECTED NOT DETECTED Final   Staphylococcus aureus (BCID) NOT DETECTED NOT DETECTED Final   Streptococcus species DETECTED (A) NOT DETECTED Final    Comment: Not Enterococcus species, Streptococcus agalactiae, Streptococcus pyogenes, or Streptococcus pneumoniae. CRITICAL RESULT CALLED TO, READ BACK BY AND VERIFIED WITH: C. WALSTON, PHARMD AT 1300 ON 08/25/19 BY C. JESSUP, MT.    Streptococcus agalactiae NOT DETECTED NOT DETECTED Final   Streptococcus pneumoniae NOT DETECTED NOT DETECTED Final    Streptococcus pyogenes NOT DETECTED NOT DETECTED Final   Acinetobacter baumannii NOT DETECTED NOT DETECTED Final   Enterobacteriaceae species NOT DETECTED NOT DETECTED Final   Enterobacter cloacae complex NOT DETECTED NOT DETECTED Final   Escherichia coli NOT DETECTED NOT DETECTED Final   Klebsiella oxytoca NOT DETECTED NOT DETECTED Final   Klebsiella pneumoniae NOT DETECTED NOT DETECTED Final   Proteus species NOT DETECTED NOT DETECTED Final   Serratia marcescens NOT DETECTED NOT DETECTED Final   Haemophilus influenzae NOT DETECTED NOT DETECTED Final   Neisseria meningitidis NOT DETECTED NOT DETECTED Final   Pseudomonas aeruginosa NOT DETECTED NOT DETECTED Final   Candida albicans NOT DETECTED NOT DETECTED Final   Candida glabrata NOT DETECTED NOT DETECTED Final   Candida krusei NOT DETECTED NOT DETECTED Final   Candida parapsilosis NOT DETECTED NOT DETECTED Final   Candida tropicalis NOT DETECTED NOT DETECTED Final    Comment: Performed at [facility], [place] [address] [place], [place] [address]  SARS Coronavirus 2 by RT PCR (hospital order, performed in [facility] hospital lab) Nasopharyngeal Nasopharyngeal Swab     Status: None   Collection Time: 08/24/19  9:46 PM   Specimen: Nasopharyngeal Swab  Result Value Ref Range Status   SARS Coronavirus 2 NEGATIVE NEGATIVE Final    Comment: (NOTE) If result is NEGATIVE SARS-CoV-2 target nucleic acids are NOT DETECTED. The SARS-CoV-2 RNA is generally detectable in upper and lower  respiratory specimens during the acute phase of infection. The lowest  concentration of SARS-CoV-2 viral copies this assay can detect is 250  copies / mL. A negative result does not preclude SARS-CoV-2 infection  and should not be used as the sole basis for treatment or other  patient management decisions.  A negative result may occur with  improper specimen collection / handling, submission of specimen other  than nasopharyngeal swab, presence of viral  mutation(s) within the  areas targeted by this assay, and inadequate number of viral copies  (<250 copies / mL). A negative result must be combined with clinical  observations, patient history, and epidemiological information. If result is POSITIVE SARS-CoV-2 target nucleic acids are DETECTED. The SARS-CoV-2 RNA is generally detectable in upper and lower  respiratory specimens dur ing the acute phase of infection.  Positive  results are indicative of active infection with SARS-CoV-2.  Clinical  correlation with patient history and other diagnostic information is  necessary to determine patient infection status.  Positive results do  not rule out bacterial infection or co-infection with other viruses. If result is PRESUMPTIVE POSTIVE SARS-CoV-2 nucleic acids MAY BE PRESENT.   A presumptive positive result was obtained on the submitted specimen  and confirmed on repeat testing.  While 2019 novel coronavirus  (  SARS-CoV-2) nucleic acids may be present in the submitted sample  additional confirmatory testing may be necessary for epidemiological  and / or clinical management purposes  to differentiate between  SARS-CoV-2 and other Sarbecovirus currently known to infect humans.  If clinically indicated additional testing with an alternate test  methodology [phone]) is advised. The SARS-CoV-2 RNA is generally  detectable in upper and lower respiratory sp ecimens during the acute  phase of infection. The expected result is Negative. Fact Sheet for Patients:  StrictlyIdeas.no Fact Sheet for Healthcare Providers: BankingDealers.co.za This test is not yet approved or cleared by the [place] FDA and has been authorized for detection and/or diagnosis of SARS-CoV-2 by FDA under an Emergency Use Authorization (EUA).  This EUA will remain in effect (meaning this test can be used) for the duration of the COVID-19 declaration under Section 564(b)(1)  of the Act, 21 U.S.C. section 360bbb-3(b)(1), unless the authorization is terminated or revoked sooner. Performed at [facility], [place] [address] [place], [place] [address]   MRSA PCR Screening     Status: None   Collection Time: 08/25/19  2:11 AM  Result Value Ref Range Status   MRSA by PCR NEGATIVE NEGATIVE Final    Comment:        The GeneXpert MRSA Assay (FDA approved for NASAL specimens only), is one component of a comprehensive MRSA colonization surveillance program. It is not intended to diagnose MRSA infection nor to guide or monitor treatment for MRSA infections. Performed at [facility], [place] [address], [place], [place] [address]   Respiratory Panel by PCR     Status: None   Collection Time: 08/25/19 10:00 AM   Specimen: Flu Kit Nasopharyngeal Swab; Respiratory  Result Value Ref Range Status   Adenovirus NOT DETECTED NOT DETECTED Final   Coronavirus 229E NOT DETECTED NOT DETECTED Final    Comment: (NOTE) The Coronavirus on the Respiratory Panel, DOES NOT test for the novel  Coronavirus (2019 nCoV)    Coronavirus HKU1 NOT DETECTED NOT DETECTED Final   Coronavirus NL63 NOT DETECTED NOT DETECTED Final   Coronavirus OC43 NOT DETECTED NOT DETECTED Final   Metapneumovirus NOT DETECTED NOT DETECTED Final   Rhinovirus / Enterovirus NOT DETECTED NOT DETECTED Final   Influenza A NOT DETECTED NOT DETECTED Final   Influenza B NOT DETECTED NOT DETECTED Final   Parainfluenza Virus 1 NOT DETECTED NOT DETECTED Final   Parainfluenza Virus 2 NOT DETECTED NOT DETECTED Final   Parainfluenza Virus 3 NOT DETECTED NOT DETECTED Final   Parainfluenza Virus 4 NOT DETECTED NOT DETECTED Final   Respiratory Syncytial Virus NOT DETECTED NOT DETECTED Final   Bordetella pertussis NOT DETECTED NOT DETECTED Final   Chlamydophila pneumoniae NOT DETECTED NOT DETECTED Final   Mycoplasma pneumoniae NOT DETECTED NOT DETECTED Final    Comment: Performed at [facility], [place]. [address] [place], [place] [address]  Culture, blood (routine x 2)     Status: None   Collection Time: 08/25/19  7:20 PM   Specimen: BLOOD RIGHT HAND  Result Value Ref Range Status   Specimen Description BLOOD RIGHT HAND  Final   Special Requests IN PEDIATRIC BOTTLE Blood Culture adequate volume  Final   Culture   Final    NO GROWTH 5 DAYS Performed at [facility], [place] [address], [place], [place] [address]    Report Status 08/30/2019 FINAL  Final  Culture, blood (routine x 2)     Status:  None   Collection Time: 08/25/19  7:25 PM   Specimen: BLOOD RIGHT HAND  Result Value Ref Range Status   Specimen Description BLOOD RIGHT HAND  Final   Special Requests   Final    BOTTLES DRAWN AEROBIC ONLY Blood Culture adequate volume   Culture   Final    NO GROWTH 5 DAYS Performed at [facility], [address] [place], [place] [address]    Report Status 08/30/2019 FINAL  Final  Cath Tip Culture     Status: None   Collection Time: 08/27/19  3:48 PM   Specimen: Catheter Tip; Other  Result Value Ref Range Status   Specimen Description CATH TIP  Final   Special Requests NONE  Final   Culture   Final    NO GROWTH 2 DAYS Performed at [facility], [address], [place], [place] [address]    Report Status 08/29/2019 FINAL  Final    Impression/Plan:  1. Bacteremia - positive blood cultures with repeat blood cultures negative.  TEE without vegetation.  Continue cefazolin through 11/16 and stop.  2.  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    I will sign off, thanks for consultation

## 2019-08-31 NOTE — Transfer of Care (Signed)
Immediate Anesthesia Transfer of Care Note  Patient: Hope Pigeon Collingsworth  Procedure(s) Performed: TRANSESOPHAGEAL ECHOCARDIOGRAM (TEE) (N/A )  Patient Location: Endoscopy Unit  Anesthesia Type:MAC  Level of Consciousness: awake, patient cooperative and responds to stimulation  Airway & Oxygen Therapy: Patient Spontanous Breathing and Patient connected to nasal cannula oxygen  Post-op Assessment: Report given to RN and Post -op Vital signs reviewed and stable  Post vital signs: Reviewed and stable  Last Vitals:  Vitals Value Taken Time  BP 125/64 08/31/19 1146  Temp 37.1 C 08/31/19 1146  Pulse 78 08/31/19 1149  Resp 21 08/31/19 1149  SpO2 97 % 08/31/19 1149  Vitals shown include unvalidated device data.  Last Pain:  Vitals:   08/31/19 1146  TempSrc: Temporal  PainSc: 0-No pain      Patients Stated Pain Goal: 0 (41/93/79 0240)  Complications: No apparent anesthesia complications

## 2019-08-31 NOTE — H&P (Signed)
   INTERVAL PROCEDURE H&P  History and Physical Interval Note:  08/31/2019 10:50 AM  Jesus Ayala has presented today for their planned procedure. The various methods of treatment have been discussed with the patient and family. After consideration of risks, benefits and other options for treatment, the patient has consented to the procedure.  The patients' outpatient history has been reviewed, patient examined, and no change in status from most recent office note within the past 30 days. I have reviewed the patients' chart and labs and will proceed as planned. Questions were answered to the patient's satisfaction.   Pixie Casino, MD, [facility], [place] Director of the [facility]  Card[facility] Diplomate of the American Board of Clinical Lipidology Attending Cardiologist  Direct Dial: [phone]  Fax: [phone]  Website:  [email or web address] 08/31/2019, 10:50 AM

## 2019-08-31 NOTE — Progress Notes (Addendum)
PROGRESS NOTE  Jesus Ayala UYQ:[id number] DOB: 12/22/1950 DOA: 08/24/2019 PCP: Seward Carol, PA-C  HPI/Recap of past 50 hours: 68 year old dialysis TTS schedule, anemia, hypertension, hyperlipidemia presented on 08/24/2019 with dizziness and fever.  On arrival to the ER, he was febrile with temperature 102.5.  Patient started on broad-spectrum antibiotics and admitted.  Blood cultures positive for Streptococcus.  Antibiotic deescalated. Dialysis catheter removed from right anterior chest.  Left upper extremity AVG was placed in 07/14/2019 that has been used now.   08/31/19: Patient was seen and examined at his bedside this morning.  No acute events overnight.  He has no new complaints.  TEE completed with no valvular vegetation.  Seen by infectious disease, plan to complete IV antibiotics on 09/07/2019.  Will arrange plan for infusion of hemodialysis Monday Wednesday Friday with nephrology prior to DC.  Anticipate discharge date 09/01/2019.  Assessment/Plan: Principal Problem:   Bacteremia due to other bacteria Active Problems:   HLD (hyperlipidemia)   Hypertension   Normocytic anemia   ESRD (end stage renal disease) (HCC)   SIRS (systemic inflammatory response syndrome) (HCC)  Resolving sepsis with streptococcal bacteremia present on admission:  Sepsis physiology is resolving.  TTE done on 08/27/2019 no valvular vegetation was visualized.   TEE completed on 08/31/2019 no sign of valvular vegetation.   Continue Ancef.  Repeated blood cultures drawn 08/25/2019 - to date.  Catheter tip culture done on 08/27/2019 - to date. Primary source unknown.  Right chest permacath removed, repeat cultures negative. Currently on cefazolin with hemodialysis Monday Wednesday Friday, end date 09/07/2019. Seen by infectious disease, plan to complete IV antibiotics on 09/07/2019.  Will arrange plan for infusion of hemodialysis Monday Wednesday Friday with nephrology prior to DC.  Anticipate discharge  date 09/01/2019.  ESRD on HD MWF ESRD on HD since September 2020. Last HD was on Saturday, 08/29/2019 HD planned today 08/31/2019. Management per nephrology  Anemia of chronic disease secondary to ESRD: Hemoglobin stable 9.5 from 9.4 previously.  Management per nephrology.  Hyperlipidemia: On statin stable.  Hypertension: Stable.  Blood pressure is at goal.   DVT prophylaxis:  Subcu heparin 5000 3 times daily Code Status: Full code Family Communication:  none at bedside. Disposition Plan:  DC to home on 09/01/2019.   Consultants:   Cardiology for TEE  Nephrology  Procedures:   None  Antimicrobials:   Vancomycin and Zosyn,  Cefazolin, 08/27/2019>>>> 09/07/2019.     Objective: Vitals:   08/31/19 1009 08/31/19 1146 08/31/19 1156 08/31/19 1215  BP:  125/64 (!) 147/67 139/74  Pulse:  78 75 71  Resp:  18 16   Temp: 98.9 F (37.2 C) 98.7 F (37.1 C)  97.9 F (36.6 C)  TempSrc: Temporal Temporal  Oral  SpO2:  99% 100% 100%  Weight:      Height:        Intake/Output Summary (Last 24 hours) at 08/31/2019 1350 Last data filed at 08/31/2019 1149 Gross per 24 hour  Intake 100 ml  Output -  Net 100 ml   Filed Weights   08/28/19 0509 08/29/19 0709 08/29/19 1055  Weight: 72 kg 72.9 kg 70.9 kg    Exam:  . General: 68 y.o. year-old male pleasant well-developed well-nourished no acute distress.  Alert and oriented x4.  Cardiovascular: Regular rate and rhythm no rubs or gallops. [name] Respiratory: Clear to auscultation no wheezes or rales. . Abdomen: Soft nontender nondistended normal bowel sounds.  Musculoskeletal: No lower extremity edema.   [name] Psychiatry:  Mood is appropriate for condition and setting.   Data Reviewed: CBC: Recent Labs  Lab 08/24/19 2012  08/27/19 0243 08/28/19 0305 08/29/19 0246 08/30/19 0320 08/31/19 0324  WBC 7.3   < > 6.5 5.4 5.3 5.4 6.8  NEUTROABS 6.3  --   --   --   --   --   --   HGB 10.8*   < > 9.4* 9.6* 9.4* 9.5* 9.2*   HCT 32.8*   < > 28.0* 29.4* 28.2* 29.7* 28.6*  MCV 89.6   < > 88.6 90.2 87.6 90.8 91.1  PLT 181   < > 156 192 198 213 227   < > = values in this interval not displayed.   Basic Metabolic Panel: Recent Labs  Lab 08/27/19 0243 08/28/19 0305 08/29/19 0246 08/30/19 0320 08/31/19 0324  NA 138 136 136 137 136  K 3.7 3.6 3.7 4.3 4.2  CL 97* 96* 97* 100 100  CO2 _0 GLUCOSE 78 85 81 88 82  BUN 18 28* 36* 26* 35*  CREATININE 5.96* 8.58* 10.36* 7.84* 10.08*  CALCIUM 9.0 9.1 9.0 9.0 9.1   GFR: Estimated Creatinine Clearance: 7.1 mL/min (A) (by C-G formula based on SCr of 10.08 mg/dL (H)). Liver Function Tests: Recent Labs  Lab 08/24/19 2012  AST 16  ALT 17  ALKPHOS 54  BILITOT 0.5  PROT 7.5  ALBUMIN 4.2   No results for input(s): LIPASE, AMYLASE in the last 168 hours. No results for input(s): AMMONIA in the last 168 hours. Coagulation Profile: Recent Labs  Lab 08/25/19 0420  INR 1.1   Cardiac Enzymes: No results for input(s): CKTOTAL, CKMB, CKMBINDEX, TROPONINI in the last 168 hours. BNP (last 3 results) No results for input(s): PROBNP in the last 8760 hours. HbA1C: No results for input(s): HGBA1C in the last 72 hours. CBG: No results for input(s): GLUCAP in the last 168 hours. Lipid Profile: No results for input(s): CHOL, HDL, LDLCALC, TRIG, CHOLHDL, LDLDIRECT in the last 72 hours. Thyroid Function Tests: No results for input(s): TSH, T4TOTAL, FREET4, T3FREE, THYROIDAB in the last 72 hours. Anemia Panel: No results for input(s): VITAMINB12, FOLATE, FERRITIN, TIBC, IRON, RETICCTPCT in the last 72 hours. Urine analysis:    Component Value Date/Time   COLORURINE YELLOW 08/25/2019 1540   APPEARANCEUR CLEAR 08/25/2019 1540   LABSPEC 1.011 08/25/2019 1540   PHURINE 8.0 08/25/2019 1540   GLUCOSEU NEGATIVE 08/25/2019 1540   HGBUR NEGATIVE 08/25/2019 1540   BILIRUBINUR NEGATIVE 08/25/2019 1540   KETONESUR NEGATIVE 08/25/2019 1540   PROTEINUR >=300 (A)  08/25/2019 1540   UROBILINOGEN 1.0 12/12/2009 0201   NITRITE NEGATIVE 08/25/2019 1540   LEUKOCYTESUR NEGATIVE 08/25/2019 1540   Sepsis Labs: _1 (procalcitonin:4,lacticidven:4)  ) Recent Results (from the past 240 hour(s))  Blood culture (routine x 2)     Status: Abnormal   Collection Time: 08/24/19  8:17 PM   Specimen: BLOOD RIGHT HAND  Result Value Ref Range Status   Specimen Description BLOOD RIGHT HAND  Final   Special Requests   Final    BOTTLES DRAWN AEROBIC AND ANAEROBIC Blood Culture results may not be optimal due to an excessive volume of blood received in culture bottles   Culture  Setup Time   Final    GRAM POSITIVE COCCI IN CHAINS IN BOTH AEROBIC AND ANAEROBIC BOTTLES    Culture (A)  Final    STREPTOCOCCUS MITIS/ORALIS SUSCEPTIBILITIES PERFORMED ON PREVIOUS CULTURE WITHIN THE LAST 5 DAYS. Performed at [facility], [place] [address] [place], [place] [address]    Report Status 08/27/2019 FINAL  Final  Blood culture (routine x 2)     Status: Abnormal   Collection Time: 08/24/19  8:17 PM   Specimen: BLOOD RIGHT FOREARM  Result Value Ref Range Status   Specimen Description BLOOD RIGHT FOREARM  Final   Special Requests   Final    BOTTLES DRAWN AEROBIC AND ANAEROBIC Blood Culture adequate volume   Culture  Setup Time   Final    GRAM POSITIVE COCCI IN CHAINS IN BOTH AEROBIC AND ANAEROBIC BOTTLES CRITICAL RESULT CALLED TO, READ BACK BY AND VERIFIED WITH: C. WALSTON PHARMD, AT 2426 08/25/19 BY D. VANHOOK CRITICAL RESULT CALLED TO, READ BACK BY AND VERIFIED WITH: C. WALSTON, PHARMD AT 1300 ON 08/25/19 BY C. JESSUP, MT. Performed at [facility], [place] [address], [place], [place] [address]    Culture STREPTOCOCCUS MITIS/ORALIS (A)  Final   Report Status 08/27/2019 FINAL  Final   Organism ID, Bacteria STREPTOCOCCUS MITIS/ORALIS  Final      Susceptibility   Streptococcus mitis/oralis - MIC*    TETRACYCLINE 0.5 SENSITIVE Sensitive     VANCOMYCIN <=0.12  SENSITIVE Sensitive     CLINDAMYCIN <=0.25 SENSITIVE Sensitive     PENICILLIN Value in next row Sensitive      SENSITIVE<=0.06    CEFTRIAXONE Value in next row Sensitive      SENSITIVE<=0.12    * STREPTOCOCCUS MITIS/ORALIS  Blood Culture ID Panel (Reflexed)     Status: Abnormal   Collection Time: 08/24/19  8:17 PM  Result Value Ref Range Status   Enterococcus species NOT DETECTED NOT DETECTED Final   Listeria monocytogenes NOT DETECTED NOT DETECTED Final   Staphylococcus species NOT DETECTED NOT DETECTED Final   Staphylococcus aureus (BCID) NOT DETECTED NOT DETECTED Final   Streptococcus species DETECTED (A) NOT DETECTED Final    Comment: Not Enterococcus species, Streptococcus agalactiae, Streptococcus pyogenes, or Streptococcus pneumoniae. CRITICAL RESULT CALLED TO, READ BACK BY AND VERIFIED WITH: C. WALSTON, PHARMD AT 1300 ON 08/25/19 BY C. JESSUP, MT.    Streptococcus agalactiae NOT DETECTED NOT DETECTED Final   Streptococcus pneumoniae NOT DETECTED NOT DETECTED Final   Streptococcus pyogenes NOT DETECTED NOT DETECTED Final   Acinetobacter baumannii NOT DETECTED NOT DETECTED Final   Enterobacteriaceae species NOT DETECTED NOT DETECTED Final   Enterobacter cloacae complex NOT DETECTED NOT DETECTED Final   Escherichia coli NOT DETECTED NOT DETECTED Final   Klebsiella oxytoca NOT DETECTED NOT DETECTED Final   Klebsiella pneumoniae NOT DETECTED NOT DETECTED Final   Proteus species NOT DETECTED NOT DETECTED Final   Serratia marcescens NOT DETECTED NOT DETECTED Final   Haemophilus influenzae NOT DETECTED NOT DETECTED Final   Neisseria meningitidis NOT DETECTED NOT DETECTED Final   Pseudomonas aeruginosa NOT DETECTED NOT DETECTED Final   Candida albicans NOT DETECTED NOT DETECTED Final   Candida glabrata NOT DETECTED NOT DETECTED Final   Candida krusei NOT DETECTED NOT DETECTED Final   Candida parapsilosis NOT DETECTED NOT DETECTED Final   Candida tropicalis NOT DETECTED NOT DETECTED  Final    Comment: Performed at [facility], [place] [address] [place], [place] [address]  SARS Coronavirus 2 by RT PCR (hospital order, performed in [facility] hospital lab) Nasopharyngeal Nasopharyngeal Swab     Status: None   Collection Time: 08/24/19  9:46 PM   Specimen: Nasopharyngeal Swab  Result Value Ref Range Status   SARS Coronavirus 2 NEGATIVE  NEGATIVE Final    Comment: (NOTE) If result is NEGATIVE SARS-CoV-2 target nucleic acids are NOT DETECTED. The SARS-CoV-2 RNA is generally detectable in upper and lower  respiratory specimens during the acute phase of infection. The lowest  concentration of SARS-CoV-2 viral copies this assay can detect is 250  copies / mL. A negative result does not preclude SARS-CoV-2 infection  and should not be used as the sole basis for treatment or other  patient management decisions.  A negative result may occur with  improper specimen collection / handling, submission of specimen other  than nasopharyngeal swab, presence of viral mutation(s) within the  areas targeted by this assay, and inadequate number of viral copies  (<250 copies / mL). A negative result must be combined with clinical  observations, patient history, and epidemiological information. If result is POSITIVE SARS-CoV-2 target nucleic acids are DETECTED. The SARS-CoV-2 RNA is generally detectable in upper and lower  respiratory specimens dur ing the acute phase of infection.  Positive  results are indicative of active infection with SARS-CoV-2.  Clinical  correlation with patient history and other diagnostic information is  necessary to determine patient infection status.  Positive results do  not rule out bacterial infection or co-infection with other viruses. If result is PRESUMPTIVE POSTIVE SARS-CoV-2 nucleic acids MAY BE PRESENT.   A presumptive positive result was obtained on the submitted specimen  and confirmed on repeat testing.  While 2019 novel coronavirus   (SARS-CoV-2) nucleic acids may be present in the submitted sample  additional confirmatory testing may be necessary for epidemiological  and / or clinical management purposes  to differentiate between  SARS-CoV-2 and other Sarbecovirus currently known to infect humans.  If clinically indicated additional testing with an alternate test  methodology [phone]) is advised. The SARS-CoV-2 RNA is generally  detectable in upper and lower respiratory sp ecimens during the acute  phase of infection. The expected result is Negative. Fact Sheet for Patients:  StrictlyIdeas.no Fact Sheet for Healthcare Providers: BankingDealers.co.za This test is not yet approved or cleared by the [place] FDA and has been authorized for detection and/or diagnosis of SARS-CoV-2 by FDA under an Emergency Use Authorization (EUA).  This EUA will remain in effect (meaning this test can be used) for the duration of the COVID-19 declaration under Section 564(b)(1) of the Act, 21 U.S.C. section 360bbb-3(b)(1), unless the authorization is terminated or revoked sooner. Performed at [facility], [place] [address], [place], [place] [address]   MRSA PCR Screening     Status: None   Collection Time: 08/25/19  2:11 AM  Result Value Ref Range Status   MRSA by PCR NEGATIVE NEGATIVE Final    Comment:        The GeneXpert MRSA Assay (FDA approved for NASAL specimens only), is one component of a comprehensive MRSA colonization surveillance program. It is not intended to diagnose MRSA infection nor to guide or monitor treatment for MRSA infections. Performed at [facility], [place] [address], [place], [place] [address]   Respiratory Panel by PCR     Status: None   Collection Time: 08/25/19 10:00 AM   Specimen: Flu Kit Nasopharyngeal Swab; Respiratory  Result Value Ref Range Status   Adenovirus NOT DETECTED NOT DETECTED Final   Coronavirus 229E NOT DETECTED NOT  DETECTED Final    Comment: (NOTE) The Coronavirus on the Respiratory Panel, DOES NOT test for the novel  Coronavirus (2019 nCoV)    Coronavirus HKU1 NOT DETECTED NOT  DETECTED Final   Coronavirus NL63 NOT DETECTED NOT DETECTED Final   Coronavirus OC43 NOT DETECTED NOT DETECTED Final   Metapneumovirus NOT DETECTED NOT DETECTED Final   Rhinovirus / Enterovirus NOT DETECTED NOT DETECTED Final   Influenza A NOT DETECTED NOT DETECTED Final   Influenza B NOT DETECTED NOT DETECTED Final   Parainfluenza Virus 1 NOT DETECTED NOT DETECTED Final   Parainfluenza Virus 2 NOT DETECTED NOT DETECTED Final   Parainfluenza Virus 3 NOT DETECTED NOT DETECTED Final   Parainfluenza Virus 4 NOT DETECTED NOT DETECTED Final   Respiratory Syncytial Virus NOT DETECTED NOT DETECTED Final   Bordetella pertussis NOT DETECTED NOT DETECTED Final   Chlamydophila pneumoniae NOT DETECTED NOT DETECTED Final   Mycoplasma pneumoniae NOT DETECTED NOT DETECTED Final    Comment: Performed at [facility], [place] [address] [place], [place] [address]  Culture, blood (routine x 2)     Status: None   Collection Time: 08/25/19  7:20 PM   Specimen: BLOOD RIGHT HAND  Result Value Ref Range Status   Specimen Description BLOOD RIGHT HAND  Final   Special Requests IN PEDIATRIC BOTTLE Blood Culture adequate volume  Final   Culture   Final    NO GROWTH 5 DAYS Performed at [facility], [place] [address] [place], [place] [address]    Report Status 08/30/2019 FINAL  Final  Culture, blood (routine x 2)     Status: None   Collection Time: 08/25/19  7:25 PM   Specimen: BLOOD RIGHT HAND  Result Value Ref Range Status   Specimen Description BLOOD RIGHT HAND  Final   Special Requests   Final    BOTTLES DRAWN AEROBIC ONLY Blood Culture adequate volume   Culture   Final    NO GROWTH 5 DAYS Performed at [facility], [place] [address], [place], [place] [address]    Report Status 08/30/2019 FINAL  Final  Cath Tip Culture      Status: None   Collection Time: 08/27/19  3:48 PM   Specimen: Catheter Tip; Other  Result Value Ref Range Status   Specimen Description CATH TIP  Final   Special Requests NONE  Final   Culture   Final    NO GROWTH 2 DAYS Performed at [facility], [address] [place], [place] [address]    Report Status 08/29/2019 FINAL  Final      Studies: No results found.  Scheduled Meds: . amLODipine  10 mg Oral Daily  . aspirin  81 mg Oral Daily  . calcitRIOL  0.25 mcg Oral Daily  . calcium acetate  1,334 mg Oral TID WC  . furosemide  80 mg Oral Daily  . heparin  5,000 Units Subcutaneous Q8H  . hydrALAZINE  25 mg Oral TID  . hydrocortisone  1 application Rectal BID  . latanoprost  1 drop Both Eyes QHS  . metoprolol tartrate  100 mg Oral BID  . traZODone  50 mg Oral QHS    Continuous Infusions: .  ceFAZolin (ANCEF) IV 2 g (08/28/19 2115)     LOS: 6 days     Kayleen Memos, MD [facility] Pager [phone]  If 7PM-7AM, please contact night-coverage [email or web address] Password [email or web address] 08/31/2019, 1:50 PM

## 2019-08-31 NOTE — Procedures (Signed)
   I was present at this dialysis session, have reviewed the session itself and made  appropriate changes Kelly Splinter MD [place] pager [phone]   08/31/2019, 5:37 PM

## 2019-08-31 NOTE — Anesthesia Postprocedure Evaluation (Signed)
Anesthesia Post Note  Patient: Jesus Ayala  Procedure(s) Performed: TRANSESOPHAGEAL ECHOCARDIOGRAM (TEE) (N/A )     Patient location during evaluation: Endoscopy Anesthesia Type: General Level of consciousness: awake Pain management: pain level controlled Vital Signs Assessment: post-procedure vital signs reviewed and stable Respiratory status: spontaneous breathing Cardiovascular status: stable Postop Assessment: no apparent nausea or vomiting Anesthetic complications: no    Last Vitals:  Vitals:   08/31/19 1600 08/31/19 1630  BP: 117/74 137/72  Pulse: 86 87  Resp: 16 16  Temp:    SpO2:      Last Pain:  Vitals:   08/31/19 1355  TempSrc: Oral  PainSc: 0-No pain                 Tiant Peixoto

## 2019-08-31 NOTE — CV Procedure (Signed)
TRANSESOPHAGEAL ECHOCARDIOGRAM (TEE) NOTE  INDICATIONS: infective endocarditis  PROCEDURE:   Informed consent was obtained prior to the procedure. The risks, benefits and alternatives for the procedure were discussed and the patient comprehended these risks.  Risks include, but are not limited to, cough, sore throat, vomiting, nausea, somnolence, esophageal and stomach trauma or perforation, bleeding, low blood pressure, aspiration, pneumonia, infection, trauma to the teeth and death.    After a procedural time-out, the patient was given propofol per anesthesia for sedation.  The patient's heart rate, blood pressure, and oxygen saturation are monitored continuously during the procedure.The oropharynx was anesthetized 1 cetacaine spray.  The transesophageal probe was inserted in the esophagus and stomach without difficulty and multiple views were obtained.  The patient was kept under observation until the patient left the procedure room. The patient left the procedure room in stable condition.   Agitated microbubble saline contrast was not administered.  COMPLICATIONS:    There were no immediate complications.  Findings:  1. LEFT VENTRICLE: The left ventricular wall thickness is moderately increased.  The left ventricular cavity is normal in size. Wall motion is normal.  LVEF is 60-65%.  2. RIGHT VENTRICLE:  The right ventricle is normal in structure and function without any thrombus or masses.    3. LEFT ATRIUM:  The left atrium is normal in size without any thrombus or masses.  There is not spontaneous echo contrast ("smoke") in the left atrium consistent with a low flow state.  4. LEFT ATRIAL APPENDAGE:  The left atrial appendage is free of any thrombus or masses. The appendage has single lobes. Pulse doppler indicates moderate flow in the appendage.  5. ATRIAL SEPTUM:  The atrial septum appears intact and is free of thrombus and/or masses.  There is no evidence for interatrial  shunting by color doppler.  6. RIGHT ATRIUM:  The right atrium is normal in size and function without any thrombus or masses.  7. MITRAL VALVE:  The mitral valve is normal in structure and function with trivial regurgitation.  There were no vegetations or stenosis.  8. AORTIC VALVE:  The aortic valve is trileaflet, normal in structure and function with no regurgitation. Lambl's excrescences (normal variant) noted at the leaflet tips. There were no vegetations or stenosis  9. TRICUSPID VALVE:  The tricuspid valve is normal in structure and function with trivial regurgitation.  There were no vegetations or stenosis  10.  PULMONIC VALVE:  The pulmonic valve is normal in structure and function with Mild regurgitation.  There were no vegetations or stenosis.   11. AORTIC ARCH, ASCENDING AND DESCENDING AORTA:  There was grade 1 Ron [place] et. Al, 1992) atherosclerosis of the ascending aorta, aortic arch, or proximal descending aorta.  12. PULMONARY VEINS: Anomalous pulmonary venous return was not noted.  13. PERICARDIUM: The pericardium appeared normal and non-thickened.  There is no pericardial effusion.  IMPRESSION:   1. No endocarditis. 2. No LAA thrombus 3. Negative for PFO by color doppler 4. Moderate LVH 5. LVEF 60-65%  RECOMMENDATIONS:    1.  Antibiotic therapy for bacteremia as per ID recommendations.  Time Spent Directly with the Patient:  45 minutes   Pixie Casino, MD, [facility], [place] Director of the [facility]  [facility] Diplomate of the American Board of Clinical Lipidology Attending Cardiologist  Direct Dial: [phone]  Fax: [phone]  Website:  [email or web address] [email or web address] 08/31/2019, 11:44 AM

## 2019-08-31 NOTE — Progress Notes (Signed)
PT Cancellation Note  Patient Details Name: Jesus Ayala MRN: [id number] DOB: April 16, 1951   Cancelled Treatment:    Reason Eval/Treat Not Completed: Patient at procedure or test/unavailable. PT off unit at HDU. PT will follow up a time allows.   Zenaida Niece 08/31/2019, 3:21 PM

## 2019-08-31 NOTE — Anesthesia Preprocedure Evaluation (Signed)
Anesthesia Evaluation  Patient identified by MRN, date of birth, ID band Patient awake    Reviewed: Allergy & Precautions, NPO status   Airway Mallampati: II  TM Distance: >3 FB     Dental   Pulmonary former smoker,    breath sounds clear to auscultation       Cardiovascular hypertension,  Rhythm:Irregular Rate:Normal     Neuro/Psych    GI/Hepatic negative GI ROS, Neg liver ROS,   Endo/Other    Renal/GU Renal disease     Musculoskeletal   Abdominal   Peds  Hematology  (+) anemia ,   Anesthesia Other Findings   Reproductive/Obstetrics                             Anesthesia Physical Anesthesia Plan  ASA: III  Anesthesia Plan: General   Post-op Pain Management:    Induction:   PONV Risk Score and Plan: 2 and Propofol infusion  Airway Management Planned: Nasal Cannula, Simple Face Mask and Mask  Additional Equipment:   Intra-op Plan:   Post-operative Plan:   Informed Consent: I have reviewed the patients History and Physical, chart, labs and discussed the procedure including the risks, benefits and alternatives for the proposed anesthesia with the patient or authorized representative who has indicated his/her understanding and acceptance.     Dental advisory given  Plan Discussed with: CRNA and Anesthesiologist  Anesthesia Plan Comments:         Anesthesia Quick Evaluation

## 2019-08-31 NOTE — Progress Notes (Signed)
Echocardiogram Echocardiogram Transesophageal has been performed.  [name] Such 08/31/2019, 11:48 AM

## 2019-09-01 ENCOUNTER — Encounter (HOSPITAL_COMMUNITY): Payer: Self-pay | Admitting: Internal Medicine

## 2019-09-01 NOTE — Care Management Important Message (Signed)
Important Message  Patient Details  Name: KAILASH GLASSBURN MRN: [id number] Date of Birth: 05-19-1951   Medicare Important Message Given:  Yes     Shelda Altes 09/01/2019, 1:23 PM

## 2019-09-01 NOTE — Consult Note (Signed)
   [facility] CM Inpatient Consult   09/01/2019  BLAND PRASKA [birth year] [id number]    Patient was screened for 25% high risk score for unplanned readmission with 3 hospitalizations and 1 ED visit in the last 6 months, and to check for potential[facility] Care [facility] services needs under his Medicare/ [employer] plan.  Per MD HPI/Recap note dated 08/31/19,reveals as:   68 year old dialysis TTS schedule, anemia, hypertension, hyperlipidemia presented on 08/24/2019 with dizziness and fever. (sepsis with streptococcal bacteremia).    . Review ofmedical record show that patient is NOT currently a beneficiary of the attributed [employer].  Reason:Hiscurrent primary care provider Ebony Hail L. Luciana Axe with [facility], who isnot affiliated with [employer]  This patient is currently NOT covered for [employer]. Membership roster used to verify status (Unattributed in Theatre stage manager).   For questions, please contact:  Edwena Felty A. Deanza Upperman, BSN, RN-BC [facility] Liaison Cell: [phone]

## 2019-09-01 NOTE — Discharge Instructions (Signed)
Bacteremia, Adult Bacteremia is the presence of bacteria in the blood. When bacteria enter the bloodstream, they can cause a life-threatening reaction called sepsis, which is a medical emergency. Bacteremia can spread to other parts of the body, including the heart, joints, and brain. What are the causes? This condition is caused by bacteria that get into the blood.  Bacteria can enter the blood: ? From a skin infection or injury, such as a burn or a cut. ? From a lung infection (pneumonia). ? From an infection in your stomach or intestines (gastrointestinal infection). ? From an infection in your bladder or urinary system (urinary tract infection). ? During a dental or medical procedure. ? From bleeding gums. ? When a bacterial infection in another part of your body spreads to your blood. ? Through an unclean (contaminated) needle. What increases the risk? This condition is more likely to develop in children, the elderly, and people who:  Have a long-term (chronic) disease or condition like diabetes or chronic kidney failure.  Have an artificial joint or heart valve.  Have heart valve disease.  Have a tube inserted to treat a medical condition, such as a urinary catheter or IV.  Have a weak disease-fighting system (immune system).  Inject illegal drugs.  Have been hospitalized for more than 10 days in a row. What are the signs or symptoms? Symptoms of this condition include:  Fever.  Chills.  Fast heartbeat.  Shortness of breath.  Dizziness.  Weakness.  Confusion.  Nausea or vomiting.  Diarrhea.  Low blood pressure.  Decreased urine output. Bacteremia that has spread to other parts of the body may cause symptoms in those areas. In some cases, there are no symptoms. How is this diagnosed? This condition may be diagnosed with a physical exam and tests, such as:  A complete blood count (CBC). This test checks for signs of infection.  Blood cultures. These  check for bacteria in your blood.  Tests of any tubes that you have had inserted. These tests check for a source of infection.  Urine tests, including urine cultures. These check for bacteria in the urine that could be a source of infection.  Imaging tests, such as an X-ray, CT scan, MRI, or heart ultrasound. These check for a source of infection in other parts of your body, such as your lungs, heart valves, or joints. How is this treated? This condition is usually treated in the hospital. Treatment may involve:  Antibiotic medicines. These may be given by mouth (orally) or directly into your blood through an IV (infusion through your vein). ? Depending on the source of infection, you may need antibiotics for several weeks. ? At first, you may be given an antibiotic to kill most types of blood bacteria (broad-spectrum antibiotic). If your test results show that a certain kind of bacteria is causing the problem, you may be given a different antibiotic to kill that specific bacteria.  IV fluids.  Removing any catheter or device that could be a source of infection.  Blood pressure and breathing support, if needed.  Surgery to control the source or the spread of infection, such as surgery to remove an infected device, abscess, or tissue.  Having follow-up visits for medicines, blood tests, and further evaluation. Follow these instructions at home: Medicines  Take over-the-counter and prescription medicines only as told by your health care provider.  If you were prescribed an antibiotic medicine, take it as told by your health care provider. Do not stop taking the   antibiotic even if you start to feel better. General instructions   Rest as needed. Ask your health care provider when you may return to normal activities.  Drink enough fluid to keep your urine pale yellow.  Do not use any products that contain nicotine or tobacco, such as cigarettes and e-cigarettes. If you need help  quitting, ask your health care provider.  Keep all follow-up visits as told by your health care provider. This is important. How is this prevented?   Wash your hands regularly with soap and water. If soap and water are not available, use hand sanitizer.  You should wash your hands: ? After using the toilet or changing a diaper. ? Before preparing, cooking, or serving food. ? While caring for a sick person or while visiting someone in a hospital. ? Before and after changing bandages (dressings) over wounds.  Clean any scrapes or cuts with soap and water and cover them with clean dressings.  Get vaccinations as recommended by your health care provider.  Practice good oral hygiene. Brush your teeth two times a day, and floss regularly.  Take good care of your skin. This includes bathing and moisturizing on a regular basis. Get help right away if you have:  Pain.  A fever or chills.  Trouble breathing.  A fast heart rate.  Skin that is blotchy, pale, or clammy.  Confusion.  Weakness.  Lack of energy (lethargy) or unusual sleepiness.  Diarrhea.  New symptoms that develop after treatment has started. These symptoms may represent a serious problem that is an emergency. Do not wait to see if the symptoms will go away. Get medical help right away. Call your local emergency services (911 in the [place] Do not drive yourself to the hospital. Summary  Bacteremia is the presence of bacteria in the blood. When bacteria enter the bloodstream, they can cause a life-threatening reaction called sepsis.  Some symptoms of bacteremia include fever, chills, shortness of breath, confusion, nausea or vomiting, and diarrhea.  Tests may be done to find the source of infection that led to bacteremia. These tests may include blood tests, urine tests, and imaging tests.  Bacteremia is usually treated with antibiotic medicines in the hospital.  Get help right away if you have any new symptoms  that develop after treatment has started. This information is not intended to replace advice given to you by your health care provider. Make sure you discuss any questions you have with your health care provider. Document Released: 07/22/2006 Document Revised: 02/17/2018 Document Reviewed: 02/17/2018 Elsevier Patient Education  2020 Elsevier Inc.  

## 2019-09-01 NOTE — Progress Notes (Signed)
Pharmacy Antibiotic Note  Jesus Ayala is a 68 y.o. male admitted on 08/24/2019 with strep bacteremia.  Pharmacy has been consulted for cefazolin dosing.TEE isnegative for endocarditis. Planning cefazolin through HD through 11/16.  Plan: Cefazolin 2 gm q HD MWF Stop ABX 11/16  Height: 5\' 11"  (180.3 cm) Weight: 160 lb 11.5 oz (72.9 kg) IBW/kg (Calculated) : 75.3  Temp (24hrs), Avg:98.6 F (37 C), Min:97.9 F (36.6 C), Max:99.5 F (37.5 C)  Recent Labs  Lab 08/27/19 0243 08/28/19 0305 08/29/19 0246 08/30/19 0320 08/31/19 0324  WBC 6.5 5.4 5.3 5.4 6.8  CREATININE 5.96* 8.58* 10.36* 7.84* 10.08*    Estimated Creatinine Clearance: 7.3 mL/min (A) (by C-G formula based on SCr of 10.08 mg/dL (H)).    Allergies  Allergen Reactions  . Ace Inhibitors Swelling and Other (See Comments)  . Enalapril Swelling    ANGIOEDEMA of lips  . Iron Sucrose Itching      Thank you for allowing pharmacy to be a part of this patient's care.   Arrie Senate, PharmD, BCPS Clinical Pharmacist [phone] Please check AMION for all [facility] numbers 09/01/2019

## 2019-09-01 NOTE — Plan of Care (Signed)
  Problem: Clinical Measurements: Goal: Ability to maintain clinical measurements within normal limits will improve Outcome: Adequate for Discharge Goal: Will remain free from infection Outcome: Adequate for Discharge Goal: Diagnostic test results will improve Outcome: Adequate for Discharge Goal: Respiratory complications will improve Outcome: Adequate for Discharge Goal: Cardiovascular complication will be avoided Outcome: Adequate for Discharge   Problem: Elimination: Goal: Will not experience complications related to bowel motility Outcome: Adequate for Discharge Goal: Will not experience complications related to urinary retention Outcome: Adequate for Discharge   Problem: Fluid Volume: Goal: Hemodynamic stability will improve Outcome: Adequate for Discharge   Problem: Clinical Measurements: Goal: Diagnostic test results will improve Outcome: Adequate for Discharge Goal: Signs and symptoms of infection will decrease Outcome: Adequate for Discharge

## 2019-09-01 NOTE — Evaluation (Signed)
Physical Therapy Evaluation Patient Details Name: Jesus Ayala MRN: [id number] DOB: 01/02/1951 Today's Date: 09/01/2019   History of Present Illness  Patient is a 68 y/o male who presents with fever and dizziness. Found to have sepsis due to streptococcus bacteremia. s/p TEE with no vegetations. PMH includes HTN, HLD, ESRD, anxiety.  Clinical Impression  Patient tolerated transfers, gait training and stair training with supervision progressing to Mod I for safety. No evidence of imbalance noted during activity. Pt reports feeling good. Independent PTA and lives at home with wife. 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 Pt does not require skilled therapy services as pt functioning close to baseline. VSS throughout with HR ranging from 70-94 bpm. Discharge from therapy.     Follow Up Recommendations No PT follow up    Equipment Recommendations  None recommended by PT    Recommendations for Other Services       Precautions / Restrictions Precautions Precautions: None Restrictions Weight Bearing Restrictions: No      Mobility  Bed Mobility               General bed mobility comments: Up in chair upon PT arrival.  Transfers Overall transfer level: Needs assistance Equipment used: None Transfers: Sit to/from Stand Sit to Stand: Supervision;Modified independent (Device/Increase time)         General transfer comment: Supervision for safety.  Ambulation/Gait Ambulation/Gait assistance: Modified independent (Device/Increase time) Gait Distance (Feet): 250 Feet Assistive device: None Gait Pattern/deviations: Step-through pattern;Decreased stride length   Gait velocity interpretation: 1.31 - 2.62 ft/sec, indicative of limited community ambulator General Gait Details: Slow, steady gait with no evidence of imbalance. HR 70-94 bpm.  Stairs Stairs: Yes Stairs assistance: Modified independent (Device/Increase  time) Stair Management: One rail Right Number of Stairs: 13 General stair comments: Good demo of safety.  Wheelchair Mobility    Modified Rankin (Stroke Patients Only)       Balance Overall balance assessment: No apparent balance deficits (not formally assessed)                                           Pertinent Vitals/Pain Pain Assessment: No/denies pain    Home Living Family/patient expects to be discharged to:: Private residence Living Arrangements: Spouse/significant other Available Help at Discharge: Family;Available PRN/intermittently Type of Home: House Home Access: Stairs to enter   [employer] of Steps: 1 Home Layout: Two level Home Equipment: None      Prior Function Level of Independence: Independent         Comments: Drives. USed to work as a Presenter, broadcasting for a college. Wants to get back into walking and exercising.     Hand Dominance   Dominant Hand: Right    Extremity/Trunk Assessment   Upper Extremity Assessment Upper Extremity Assessment: Defer to OT evaluation    Lower Extremity Assessment Lower Extremity Assessment: Overall WFL for tasks assessed    Cervical / Trunk Assessment Cervical / Trunk Assessment: Normal  Communication   Communication: No difficulties  Cognition Arousal/Alertness: Awake/alert Behavior During Therapy: WFL for tasks assessed/performed Overall Cognitive Status: Within Functional Limits for tasks assessed  General Comments      Exercises     Assessment/Plan    PT Assessment Patent does not need any further PT services  PT Problem List         PT Treatment Interventions      PT Goals (Current goals can be found in the Care Plan section)  Acute Rehab PT Goals PT Goal Formulation: All assessment and education complete, DC therapy    Frequency     Barriers to discharge        Co-evaluation                AM-PAC PT "6 Clicks" Mobility  Outcome Measure Help needed turning from your back to your side while in a flat bed without using bedrails?: None Help needed moving from lying on your back to sitting on the side of a flat bed without using bedrails?: None Help needed moving to and from a bed to a chair (including a wheelchair)?: None Help needed standing up from a chair using your arms (e.g., wheelchair or bedside chair)?: None Help needed to walk in hospital room?: None Help needed climbing 3-5 steps with a railing? : None 6 Click Score: 24    End of Session Equipment Utilized During Treatment: Gait belt Activity Tolerance: Patient tolerated treatment well Patient left: in chair;with call bell/phone within reach Nurse Communication: Mobility status PT Visit Diagnosis: Muscle weakness (generalized) (M62.81)    Time: [id number] PT Time Calculation (min) (ACUTE ONLY): 11 min   Charges:   PT Evaluation $PT Eval Low Complexity: 1 Low          Marisa Severin, PT, DPT Acute Rehabilitation Services Pager [phone] Office [phone]      [place] 09/01/2019, 8:19 AM

## 2019-09-01 NOTE — Discharge Summary (Signed)
Discharge Summary  Jesus Ayala YYT:[id number] DOB: February 17, 1951  PCP: Seward Carol, PA-C  Admit date: 08/24/2019 Discharge date: 09/01/2019  Time spent: 35 minutes  Recommendations for Outpatient Follow-up:  1. Follow-up with infectious disease 2. Follow-up with your PCP 3. Keep your hemodialysis appointments 4. Take your medications as prescribed  Discharge Diagnoses:  Active Hospital Problems   Diagnosis Date Noted  . Bacteremia due to other bacteria 08/27/2019  . SIRS (systemic inflammatory response syndrome) ([place]) 08/25/2019  . ESRD (end stage renal disease) ([place]) 06/25/2019  . Normocytic anemia 04/19/2019  . Hypertension   . HLD (hyperlipidemia)     Resolved Hospital Problems  No resolved problems to display.    Discharge Condition: Stable  Diet recommendation: Renal hemodialysis diet.  Vitals:   09/01/19 0442 09/01/19 0829  BP: 125/78 134/80  Pulse: 78 81  Resp: 15   Temp: 98.8 F (37.1 C)   SpO2: 96%     History of present illness: Jesus Ayala is a 68 y.o. male with medical history significant of HTN, recent progression of CKD to ESRD since September 2020 on HD Monday Wednesday Friday, anemia requiring blood transfusion secondary to persistently bleeding dialysis catheter, hypertension who presented with dizziness and racing heartbeat.  He had dialysis on the day of presentation and tolerated it well.  On EMS arrival noted to be febrile up to 102 he was given a Tylenol. He reports some chills but no dysuria no hematuria no nausea no vomiting no chest pain or shortness of breath no cough no urinary symptoms no sick contacts. He lives with his wife has been doing well.  Infectious risk factors:  Reports  fever,     In  ER RAPID COVID TEST NEGATIVE   Recent Labs       Lab Results  Component Value Date   [place] NEGATIVE 08/24/2019   [place] NOT DETECTED 07/10/2019   [place] NEGATIVE 06/25/2019   [place] NOT DETECTED  04/19/2019     Regarding pertinent Chronic problems:   End-stage renal disease dialyzed on Monday Wednesday Friday at [facility]   Hyperlipidemia - not on statins   HTN on Norvasc, hydralazine, metoprolol   - last echo  04/20/2019 ejection fraction of 60-65% Left ventricular diastolic Doppler parameters are consistent with  impaired relaxation.    While in ER: Blood cultures obtained patient started on broad-spectrum antibiotics vancomycin and Zosyn Presumed Sirs of unknown etiology Chest x-ray unremarkable Covid neg  Blood cultures positive for Streptococcus mitis/oralis 2/2 bottles. Antibiotic deescalated to Ancef on hemodialysis days Monday Wednesday Friday. Dialysis catheter removed from right anterior chest. Left upper extremity AVG was placed on 07/14/2019 that has been used now.   TEE completed on 08/31/2019 showing no evidence of valvular vegetation or endocarditis.  Seen by infectious disease, plan to complete IV antibiotics on 09/07/2019.    This has been arranged by case manager [facility] and nephrology Anice Paganini, [place] .  09/01/19: Patient was seen and examined at his bedside this morning.  No acute events overnight.  He has no new complaints.  Vital signs reviewed and are stable.  On the day of discharge, the patient was hemodynamically stable.  He will need to follow-up with infectious disease, his primary care provider and keep his hemodialysis appointments/antibiotics infusion Monday Wednesday Friday (antibiotic Ancef infusion to complete on 09/07/2019).  This has been arranged for him.  Patient understands and agrees to plan.   Hospital Course:  Principal Problem:  Bacteremia due to other bacteria Active Problems:   HLD (hyperlipidemia)   Hypertension   Normocytic anemia   ESRD (end stage renal disease) ([facility])   SIRS (systemic inflammatory response syndrome) ([place])  Resolving sepsis with streptococcal bacteremia present on  admission:  Sepsis physiology is resolving.  TTE done on 08/27/2019 no valvular vegetation was visualized.     TEE completed on 08/31/2019 no sign of valvular vegetation.  No sign of endocarditis. Continue Ancef.  Repeated blood cultures drawn 08/25/2019 negative final.  Catheter tip culture done on 08/27/2019 negative final. Primary source unknown. Right chest permacath removed. Currently on cefazolin with hemodialysis Monday Wednesday Friday, end date 09/07/2019. Seen by infectious disease, plan to complete IV antibiotics on 09/07/2019.  Plan for infusion at hemodialysis Monday Wednesday Friday.  This has been arranged.  Patient agrees to plan.   ESRD on HD MWF ESRD on HD since September 2020. HD on Saturday, 08/29/2019 HD on Monday 08/31/2019. Management per nephrology  Anemia of chronic disease secondary to ESRD: Hemoglobin stable 9.5 from 9.4 previously.  Management per nephrology.  Hypertension: Stable.  Blood pressure is at goal.  Follow-up with PCP.   Code Status:Full code    Consultants:  Cardiology for TEE  Nephrology  Procedures:  None  Antimicrobials:  Vancomycin and Zosyn on admission.  Cefazolin, 08/27/2019>>>> 09/07/2019.  Discharge Exam: BP 134/80   Pulse 81   Temp 98.8 F (37.1 C) (Oral)   Resp 15   Ht '5\' 11"'  (1.803 m)   Wt 72.9 kg   SpO2 96%   BMI 22.42 kg/m  . General: 68 y.o. year-old male well developed well nourished in no acute distress.  Alert and oriented x4. . Cardiovascular: Regular rate and rhythm with no rubs or gallops.  No thyromegaly or JVD noted.   [name] Respiratory: Clear to auscultation with no wheezes or rales. Good inspiratory effort. . Abdomen: Soft nontender nondistended with normal bowel sounds x4 quadrants. . Musculoskeletal: No lower extremity edema. 2/4 pulses in all 4 extremities. [name] Psychiatry: Mood is appropriate for condition and setting  Discharge Instructions You were cared for by a hospitalist during your  hospital stay. If you have any questions about your discharge medications or the care you received while you were in the hospital after you are discharged, you can call the unit and asked to speak with the hospitalist on call if the hospitalist that took care of you is not available. Once you are discharged, your primary care physician will handle any further medical issues. Please note that NO REFILLS for any discharge medications will be authorized once you are discharged, as it is imperative that you return to your primary care physician (or establish a relationship with a primary care physician if you do not have one) for your aftercare needs so that they can reassess your need for medications and monitor your lab values.   Allergies as of 09/01/2019      Reactions   Ace Inhibitors Swelling, Other (See Comments)   Enalapril Swelling   ANGIOEDEMA of lips   Iron Sucrose Itching      Medication List    STOP taking these medications   hydrocortisone 25 MG suppository Commonly known as: ANUSOL-HC     TAKE these medications   acetaminophen 500 MG tablet Commonly known as: TYLENOL Take 500-1,000 mg by mouth every 6 (six) hours as needed (pain).   amLODipine 10 MG tablet Commonly known as: NORVASC Take 10 mg by mouth daily.   aspirin  81 MG chewable tablet Chew 81 mg by mouth daily.   calcitRIOL 0.25 MCG capsule Commonly known as: ROCALTROL Take 0.25 mcg by mouth daily.   calcium acetate 667 MG capsule Commonly known as: PHOSLO Take 1 capsule (667 mg total) by mouth 3 (three) times daily with meals. What changed: how much to take   DIALYVITE 800-ZINC 15 PO Take 1 tablet by mouth daily at 3 pm.   furosemide 80 MG tablet Commonly known as: LASIX Take 80 mg by mouth daily.   hydrALAZINE 25 MG tablet Commonly known as: APRESOLINE Take 1 tablet (25 mg total) by mouth 3 (three) times daily.   hydrocortisone 2.5 % rectal cream Commonly known as: Anusol-HC Place 1 application  rectally 2 (two) times daily.   latanoprost 0.005 % ophthalmic solution Commonly known as: XALATAN Place 1 drop into both eyes at bedtime.   metoprolol tartrate 100 MG tablet Commonly known as: LOPRESSOR Take 100 mg by mouth 2 (two) times a day.   nitroGLYCERIN 0.4 MG SL tablet Commonly known as: NITROSTAT Place 0.4 mg under the tongue every 5 (five) minutes as needed for chest pain.   oxyCODONE-acetaminophen 5-325 MG tablet Commonly known as: Percocet Take 1 tablet by mouth every 6 (six) hours as needed. What changed: reasons to take this   traZODone 50 MG tablet Commonly known as: DESYREL Take 50 mg by mouth at bedtime.      Allergies  Allergen Reactions  . Ace Inhibitors Swelling and Other (See Comments)  . Enalapril Swelling    ANGIOEDEMA of lips  . Iron Sucrose Itching   Follow-up Information    Seward Carol, PA-C. Call in 1 day(s).   Specialty: Neurology Why: Please call for a post hospital follow-up appointment. Contact information: [address] [place] [address] [phone]        Thayer Headings, MD. Call in 1 day(s).   Specialty: Infectious Diseases Why: Please call for a post hospital follow-up appointment Contact information: [address] [place] [address] [phone]            The results of significant diagnostics from this hospitalization (including imaging, microbiology, ancillary and laboratory) are listed below for reference.    Significant Diagnostic Studies: Dg Orthopantogram  Result Date: 08/27/2019 CLINICAL DATA:  Pt is being eval for bacteremia associated with an intravascular line - pt states he believes he has a broken molar or wisdom tooth, not having any tooth/mouth pain EXAM: ORTHOPANTOGRAM/PANORAMIC COMPARISON:  None. FINDINGS: There is fracture of the crown of tooth 17 surrounding remaining metallic dental filling. Numerous absent teeth. No significant caries. Periapical  abscesses are identified. IMPRESSION: Fracture of crown of tooth 17. No periapical abscesses or significant caries. Electronically Signed   By: Nolon Nations M.D.   On: 08/27/2019 12:31   Dg Chest Portable 1 View  Result Date: 08/24/2019 CLINICAL DATA:  Fever EXAM: PORTABLE CHEST 1 VIEW COMPARISON:  06/25/2019 FINDINGS: Right-sided central venous port tip over the SVC. No focal opacity or pleural effusion. Normal heart size. No pneumothorax IMPRESSION: No active disease. Electronically Signed   By: Donavan Foil M.D.   On: 08/24/2019 20:32    Microbiology: Recent Results (from the past 240 hour(s))  Blood culture (routine x 2)     Status: Abnormal   Collection Time: 08/24/19  8:17 PM   Specimen: BLOOD RIGHT HAND  Result Value Ref Range Status   Specimen Description BLOOD RIGHT HAND  Final  Special Requests   Final    BOTTLES DRAWN AEROBIC AND ANAEROBIC Blood Culture results may not be optimal due to an excessive volume of blood received in culture bottles   Culture  Setup Time   Final    GRAM POSITIVE COCCI IN CHAINS IN BOTH AEROBIC AND ANAEROBIC BOTTLES    Culture (A)  Final    STREPTOCOCCUS MITIS/ORALIS SUSCEPTIBILITIES PERFORMED ON PREVIOUS CULTURE WITHIN THE LAST 5 DAYS. Performed at [facility], [place] [address], [place], [place] [address]    Report Status 08/27/2019 FINAL  Final  Blood culture (routine x 2)     Status: Abnormal   Collection Time: 08/24/19  8:17 PM   Specimen: BLOOD RIGHT FOREARM  Result Value Ref Range Status   Specimen Description BLOOD RIGHT FOREARM  Final   Special Requests   Final    BOTTLES DRAWN AEROBIC AND ANAEROBIC Blood Culture adequate volume   Culture  Setup Time   Final    GRAM POSITIVE COCCI IN CHAINS IN BOTH AEROBIC AND ANAEROBIC BOTTLES CRITICAL RESULT CALLED TO, READ BACK BY AND VERIFIED WITH: C. WALSTON PHARMD, AT 9678 08/25/19 BY D. VANHOOK CRITICAL RESULT CALLED TO, READ BACK BY AND VERIFIED WITH: C. WALSTON, PHARMD AT 1300 ON  08/25/19 BY C. JESSUP, MT. Performed at [facility], [place] [address], [place], [place] [address]    Culture STREPTOCOCCUS MITIS/ORALIS (A)  Final   Report Status 08/27/2019 FINAL  Final   Organism ID, Bacteria STREPTOCOCCUS MITIS/ORALIS  Final      Susceptibility   Streptococcus mitis/oralis - MIC*    TETRACYCLINE 0.5 SENSITIVE Sensitive     VANCOMYCIN <=0.12 SENSITIVE Sensitive     CLINDAMYCIN <=0.25 SENSITIVE Sensitive     PENICILLIN Value in next row Sensitive      SENSITIVE<=0.06    CEFTRIAXONE Value in next row Sensitive      SENSITIVE<=0.12    * STREPTOCOCCUS MITIS/ORALIS  Blood Culture ID Panel (Reflexed)     Status: Abnormal   Collection Time: 08/24/19  8:17 PM  Result Value Ref Range Status   Enterococcus species NOT DETECTED NOT DETECTED Final   Listeria monocytogenes NOT DETECTED NOT DETECTED Final   Staphylococcus species NOT DETECTED NOT DETECTED Final   Staphylococcus aureus (BCID) NOT DETECTED NOT DETECTED Final   Streptococcus species DETECTED (A) NOT DETECTED Final    Comment: Not Enterococcus species, Streptococcus agalactiae, Streptococcus pyogenes, or Streptococcus pneumoniae. CRITICAL RESULT CALLED TO, READ BACK BY AND VERIFIED WITH: C. WALSTON, PHARMD AT 1300 ON 08/25/19 BY C. JESSUP, MT.    Streptococcus agalactiae NOT DETECTED NOT DETECTED Final   Streptococcus pneumoniae NOT DETECTED NOT DETECTED Final   Streptococcus pyogenes NOT DETECTED NOT DETECTED Final   Acinetobacter baumannii NOT DETECTED NOT DETECTED Final   Enterobacteriaceae species NOT DETECTED NOT DETECTED Final   Enterobacter cloacae complex NOT DETECTED NOT DETECTED Final   Escherichia coli NOT DETECTED NOT DETECTED Final   Klebsiella oxytoca NOT DETECTED NOT DETECTED Final   Klebsiella pneumoniae NOT DETECTED NOT DETECTED Final   Proteus species NOT DETECTED NOT DETECTED Final   Serratia marcescens NOT DETECTED NOT DETECTED Final   Haemophilus influenzae NOT DETECTED NOT DETECTED  Final   Neisseria meningitidis NOT DETECTED NOT DETECTED Final   Pseudomonas aeruginosa NOT DETECTED NOT DETECTED Final   Candida albicans NOT DETECTED NOT DETECTED Final   Candida glabrata NOT DETECTED NOT DETECTED Final   Candida krusei NOT DETECTED NOT DETECTED Final   Candida parapsilosis  NOT DETECTED NOT DETECTED Final   Candida tropicalis NOT DETECTED NOT DETECTED Final    Comment: Performed at [facility], [place] [address], [place], [place] [address]  SARS Coronavirus 2 by RT PCR (hospital order, performed in [facility] hospital lab) Nasopharyngeal Nasopharyngeal Swab     Status: None   Collection Time: 08/24/19  9:46 PM   Specimen: Nasopharyngeal Swab  Result Value Ref Range Status   SARS Coronavirus 2 NEGATIVE NEGATIVE Final    Comment: (NOTE) If result is NEGATIVE SARS-CoV-2 target nucleic acids are NOT DETECTED. The SARS-CoV-2 RNA is generally detectable in upper and lower  respiratory specimens during the acute phase of infection. The lowest  concentration of SARS-CoV-2 viral copies this assay can detect is 250  copies / mL. A negative result does not preclude SARS-CoV-2 infection  and should not be used as the sole basis for treatment or other  patient management decisions.  A negative result may occur with  improper specimen collection / handling, submission of specimen other  than nasopharyngeal swab, presence of viral mutation(s) within the  areas targeted by this assay, and inadequate number of viral copies  (<250 copies / mL). A negative result must be combined with clinical  observations, patient history, and epidemiological information. If result is POSITIVE SARS-CoV-2 target nucleic acids are DETECTED. The SARS-CoV-2 RNA is generally detectable in upper and lower  respiratory specimens dur ing the acute phase of infection.  Positive  results are indicative of active infection with SARS-CoV-2.  Clinical  correlation with patient history and other diagnostic  information is  necessary to determine patient infection status.  Positive results do  not rule out bacterial infection or co-infection with other viruses. If result is PRESUMPTIVE POSTIVE SARS-CoV-2 nucleic acids MAY BE PRESENT.   A presumptive positive result was obtained on the submitted specimen  and confirmed on repeat testing.  While 2019 novel coronavirus  (SARS-CoV-2) nucleic acids may be present in the submitted sample  additional confirmatory testing may be necessary for epidemiological  and / or clinical management purposes  to differentiate between  SARS-CoV-2 and other Sarbecovirus currently known to infect humans.  If clinically indicated additional testing with an alternate test  methodology [phone]) is advised. The SARS-CoV-2 RNA is generally  detectable in upper and lower respiratory sp ecimens during the acute  phase of infection. The expected result is Negative. Fact Sheet for Patients:  StrictlyIdeas.no Fact Sheet for Healthcare Providers: BankingDealers.co.za This test is not yet approved or cleared by the [place] FDA and has been authorized for detection and/or diagnosis of SARS-CoV-2 by FDA under an Emergency Use Authorization (EUA).  This EUA will remain in effect (meaning this test can be used) for the duration of the COVID-19 declaration under Section 564(b)(1) of the Act, 21 U.S.C. section 360bbb-3(b)(1), unless the authorization is terminated or revoked sooner. Performed at [facility], [place] [address] [place], [place] [address]   MRSA PCR Screening     Status: None   Collection Time: 08/25/19  2:11 AM  Result Value Ref Range Status   MRSA by PCR NEGATIVE NEGATIVE Final    Comment:        The GeneXpert MRSA Assay (FDA approved for NASAL specimens only), is one component of a comprehensive MRSA colonization surveillance program. It is not intended to diagnose MRSA infection nor to guide or  monitor treatment for MRSA infections. Performed at [facility], [place] [address] [place], [place]  [address]   Respiratory Panel by PCR     Status: None   Collection Time: 08/25/19 10:00 AM   Specimen: Flu Kit Nasopharyngeal Swab; Respiratory  Result Value Ref Range Status   Adenovirus NOT DETECTED NOT DETECTED Final   Coronavirus 229E NOT DETECTED NOT DETECTED Final    Comment: (NOTE) The Coronavirus on the Respiratory Panel, DOES NOT test for the novel  Coronavirus (2019 nCoV)    Coronavirus HKU1 NOT DETECTED NOT DETECTED Final   Coronavirus NL63 NOT DETECTED NOT DETECTED Final   Coronavirus OC43 NOT DETECTED NOT DETECTED Final   Metapneumovirus NOT DETECTED NOT DETECTED Final   Rhinovirus / Enterovirus NOT DETECTED NOT DETECTED Final   Influenza A NOT DETECTED NOT DETECTED Final   Influenza B NOT DETECTED NOT DETECTED Final   Parainfluenza Virus 1 NOT DETECTED NOT DETECTED Final   Parainfluenza Virus 2 NOT DETECTED NOT DETECTED Final   Parainfluenza Virus 3 NOT DETECTED NOT DETECTED Final   Parainfluenza Virus 4 NOT DETECTED NOT DETECTED Final   Respiratory Syncytial Virus NOT DETECTED NOT DETECTED Final   Bordetella pertussis NOT DETECTED NOT DETECTED Final   Chlamydophila pneumoniae NOT DETECTED NOT DETECTED Final   Mycoplasma pneumoniae NOT DETECTED NOT DETECTED Final    Comment: Performed at [facility], [place]. [address], [place], [place] [address]  Culture, blood (routine x 2)     Status: None   Collection Time: 08/25/19  7:20 PM   Specimen: BLOOD RIGHT HAND  Result Value Ref Range Status   Specimen Description BLOOD RIGHT HAND  Final   Special Requests IN PEDIATRIC BOTTLE Blood Culture adequate volume  Final   Culture   Final    NO GROWTH 5 DAYS Performed at [facility], [place] [address] [place], [place] [address]    Report Status 08/30/2019 FINAL  Final  Culture, blood (routine x 2)     Status: None   Collection Time: 08/25/19  7:25 PM    Specimen: BLOOD RIGHT HAND  Result Value Ref Range Status   Specimen Description BLOOD RIGHT HAND  Final   Special Requests   Final    BOTTLES DRAWN AEROBIC ONLY Blood Culture adequate volume   Culture   Final    NO GROWTH 5 DAYS Performed at [facility], [place] [address], [place], [place] [address]    Report Status 08/30/2019 FINAL  Final  Cath Tip Culture     Status: None   Collection Time: 08/27/19  3:48 PM   Specimen: Catheter Tip; Other  Result Value Ref Range Status   Specimen Description CATH TIP  Final   Special Requests NONE  Final   Culture   Final    NO GROWTH 2 DAYS Performed at [facility], [address], [place], [place] [address]    Report Status 08/29/2019 FINAL  Final     Labs: Basic Metabolic Panel: Recent Labs  Lab 08/27/19 0243 08/28/19 0305 08/29/19 0246 08/30/19 0320 08/31/19 0324  NA 138 136 136 137 136  K 3.7 3.6 3.7 4.3 4.2  CL 97* 96* 97* 100 100  CO2 '28 26 24 25 23  ' GLUCOSE 78 85 81 88 82  BUN 18 28* 36* 26* 35*  CREATININE 5.96* 8.58* 10.36* 7.84* 10.08*  CALCIUM 9.0 9.1 9.0 9.0 9.1   Liver Function Tests: No results for input(s): AST, ALT, ALKPHOS, BILITOT, PROT, ALBUMIN in the last 168 hours. No results for input(s): LIPASE, AMYLASE in the  last 168 hours. No results for input(s): AMMONIA in the last 168 hours. CBC: Recent Labs  Lab 08/27/19 0243 08/28/19 0305 08/29/19 0246 08/30/19 0320 08/31/19 0324  WBC 6.5 5.4 5.3 5.4 6.8  HGB 9.4* 9.6* 9.4* 9.5* 9.2*  HCT 28.0* 29.4* 28.2* 29.7* 28.6*  MCV 88.6 90.2 87.6 90.8 91.1  PLT 156 192 198 213 227   Cardiac Enzymes: No results for input(s): CKTOTAL, CKMB, CKMBINDEX, TROPONINI in the last 168 hours. BNP: BNP (last 3 results) No results for input(s): BNP in the last 8760 hours.  ProBNP (last 3 results) No results for input(s): PROBNP in the last 8760 hours.  CBG: No results for input(s): GLUCAP in the last 168 hours.     Signed:  Kayleen Memos, MD [facility] 09/01/2019, 9:56 AM

## 2019-09-01 NOTE — Progress Notes (Signed)
OT Cancellation Note  Patient Details Name: Jesus Ayala MRN: [id number] DOB: [birth year]   Cancelled Treatment:    Reason Eval/Treat Not Completed: PT screened, no needs identified, will sign off  Shawna Kiener 09/01/2019, 9:42 AM  Lesle Chris, OTR/L Acute Rehabilitation Services [phone] [facility] pager [phone] office 09/01/2019

## 2019-09-02 DIAGNOSIS — D631 Anemia in chronic kidney disease: Secondary | ICD-10-CM | POA: Diagnosis not present

## 2019-09-02 DIAGNOSIS — N2581 Secondary hyperparathyroidism of renal origin: Secondary | ICD-10-CM | POA: Diagnosis not present

## 2019-09-02 DIAGNOSIS — E876 Hypokalemia: Secondary | ICD-10-CM | POA: Diagnosis not present

## 2019-09-02 DIAGNOSIS — A409 Streptococcal sepsis, unspecified: Secondary | ICD-10-CM | POA: Diagnosis not present

## 2019-09-02 DIAGNOSIS — Z992 Dependence on renal dialysis: Secondary | ICD-10-CM | POA: Diagnosis not present

## 2019-09-02 DIAGNOSIS — N186 End stage renal disease: Secondary | ICD-10-CM | POA: Diagnosis not present

## 2019-09-04 DIAGNOSIS — D631 Anemia in chronic kidney disease: Secondary | ICD-10-CM | POA: Diagnosis not present

## 2019-09-04 DIAGNOSIS — A409 Streptococcal sepsis, unspecified: Secondary | ICD-10-CM | POA: Diagnosis not present

## 2019-09-04 DIAGNOSIS — N2581 Secondary hyperparathyroidism of renal origin: Secondary | ICD-10-CM | POA: Diagnosis not present

## 2019-09-04 DIAGNOSIS — Z992 Dependence on renal dialysis: Secondary | ICD-10-CM | POA: Diagnosis not present

## 2019-09-04 DIAGNOSIS — N186 End stage renal disease: Secondary | ICD-10-CM | POA: Diagnosis not present

## 2019-09-04 DIAGNOSIS — E876 Hypokalemia: Secondary | ICD-10-CM | POA: Diagnosis not present

## 2019-09-07 DIAGNOSIS — Z992 Dependence on renal dialysis: Secondary | ICD-10-CM | POA: Diagnosis not present

## 2019-09-07 DIAGNOSIS — N186 End stage renal disease: Secondary | ICD-10-CM | POA: Diagnosis not present

## 2019-09-07 DIAGNOSIS — A409 Streptococcal sepsis, unspecified: Secondary | ICD-10-CM | POA: Diagnosis not present

## 2019-09-07 DIAGNOSIS — E876 Hypokalemia: Secondary | ICD-10-CM | POA: Diagnosis not present

## 2019-09-07 DIAGNOSIS — D631 Anemia in chronic kidney disease: Secondary | ICD-10-CM | POA: Diagnosis not present

## 2019-09-07 DIAGNOSIS — N2581 Secondary hyperparathyroidism of renal origin: Secondary | ICD-10-CM | POA: Diagnosis not present

## 2019-09-08 DIAGNOSIS — K4090 Unilateral inguinal hernia, without obstruction or gangrene, not specified as recurrent: Secondary | ICD-10-CM | POA: Diagnosis not present

## 2019-09-10 ENCOUNTER — Other Ambulatory Visit: Payer: Self-pay | Admitting: Radiology

## 2019-09-10 ENCOUNTER — Other Ambulatory Visit (HOSPITAL_COMMUNITY): Payer: Self-pay | Admitting: Nephrology

## 2019-09-10 DIAGNOSIS — N186 End stage renal disease: Secondary | ICD-10-CM

## 2019-09-10 DIAGNOSIS — Z992 Dependence on renal dialysis: Secondary | ICD-10-CM

## 2019-09-11 ENCOUNTER — Other Ambulatory Visit: Payer: Self-pay | Admitting: Radiology

## 2019-09-11 ENCOUNTER — Ambulatory Visit (HOSPITAL_COMMUNITY)
Admission: RE | Admit: 2019-09-11 | Discharge: 2019-09-11 | Disposition: A | Discharge status: Home / Self Care | Payer: Medicare Other | Source: Ambulatory Visit | Attending: Nephrology | Admitting: Nephrology

## 2019-09-11 ENCOUNTER — Encounter (HOSPITAL_COMMUNITY): Payer: Self-pay

## 2019-09-11 DIAGNOSIS — T82868A Thrombosis of vascular prosthetic devices, implants and grafts, initial encounter: Secondary | ICD-10-CM | POA: Diagnosis not present

## 2019-09-11 DIAGNOSIS — Z992 Dependence on renal dialysis: Secondary | ICD-10-CM | POA: Diagnosis not present

## 2019-09-11 DIAGNOSIS — N186 End stage renal disease: Secondary | ICD-10-CM | POA: Diagnosis not present

## 2019-09-14 ENCOUNTER — Other Ambulatory Visit: Payer: Self-pay

## 2019-09-14 ENCOUNTER — Encounter (HOSPITAL_COMMUNITY): Payer: Self-pay

## 2019-09-14 ENCOUNTER — Ambulatory Visit (HOSPITAL_COMMUNITY)
Admission: RE | Admit: 2019-09-14 | Discharge: 2019-09-14 | Disposition: A | Discharge status: Home / Self Care | Payer: Medicare Other | Source: Ambulatory Visit | Attending: Nephrology | Admitting: Nephrology

## 2019-09-14 DIAGNOSIS — N186 End stage renal disease: Secondary | ICD-10-CM | POA: Insufficient documentation

## 2019-09-14 DIAGNOSIS — F419 Anxiety disorder, unspecified: Secondary | ICD-10-CM | POA: Diagnosis not present

## 2019-09-14 DIAGNOSIS — Z992 Dependence on renal dialysis: Secondary | ICD-10-CM | POA: Insufficient documentation

## 2019-09-14 DIAGNOSIS — Z87891 Personal history of nicotine dependence: Secondary | ICD-10-CM | POA: Insufficient documentation

## 2019-09-14 DIAGNOSIS — T82868A Thrombosis of vascular prosthetic devices, implants and grafts, initial encounter: Secondary | ICD-10-CM | POA: Insufficient documentation

## 2019-09-14 DIAGNOSIS — Y841 Kidney dialysis as the cause of abnormal reaction of the patient, or of later complication, without mention of misadventure at the time of the procedure: Secondary | ICD-10-CM | POA: Diagnosis not present

## 2019-09-14 DIAGNOSIS — E782 Mixed hyperlipidemia: Secondary | ICD-10-CM | POA: Diagnosis not present

## 2019-09-14 DIAGNOSIS — Z7982 Long term (current) use of aspirin: Secondary | ICD-10-CM | POA: Diagnosis not present

## 2019-09-14 DIAGNOSIS — I120 Hypertensive chronic kidney disease with stage 5 chronic kidney disease or end stage renal disease: Secondary | ICD-10-CM | POA: Insufficient documentation

## 2019-09-14 DIAGNOSIS — Z79899 Other long term (current) drug therapy: Secondary | ICD-10-CM | POA: Insufficient documentation

## 2019-09-14 HISTORY — PX: IR THROMBECTOMY AV FISTULA W/THROMBOLYSIS/PTA INC/SHUNT/IMG LEFT: IMG6106

## 2019-09-14 HISTORY — PX: IR US GUIDE VASC ACCESS LEFT: IMG2389

## 2019-09-14 LAB — BASIC METABOLIC PANEL
Anion gap: 15 (ref 5–15)
BUN: 83 mg/dL — ABNORMAL HIGH (ref 8–23)
CO2: 20 mmol/L — ABNORMAL LOW (ref 22–32)
Calcium: 9 mg/dL (ref 8.9–10.3)
Chloride: 102 mmol/L (ref 98–111)
Creatinine, Ser: 17.03 mg/dL — ABNORMAL HIGH (ref 0.61–1.24)
GFR calc Af Amer: 3 mL/min — ABNORMAL LOW (ref >60)
GFR calc non Af Amer: 3 mL/min — ABNORMAL LOW (ref >60)
Glucose, Bld: 93 mg/dL (ref 70–99)
Potassium: 4.6 mmol/L (ref 3.5–5.1)
Sodium: 137 mmol/L (ref 135–145)

## 2019-09-14 LAB — PROTIME-INR
INR: 1 (ref 0.8–1.2)
Prothrombin Time: 13.1 seconds (ref 11.4–15.2)

## 2019-09-14 LAB — CBC
HCT: 35.7 % — ABNORMAL LOW (ref 39.0–52.0)
Hemoglobin: 11.4 g/dL — ABNORMAL LOW (ref 13.0–17.0)
MCH: 28.9 pg (ref 26.0–34.0)
MCHC: 31.9 g/dL (ref 30.0–36.0)
MCV: 90.6 fL (ref 80.0–100.0)
Platelets: 318 10*3/uL (ref 150–400)
RBC: 3.94 MIL/uL — ABNORMAL LOW (ref 4.22–5.81)
RDW: 18.4 % — ABNORMAL HIGH (ref 11.5–15.5)
WBC: 4.7 10*3/uL (ref 4.0–10.5)
nRBC: 0 % (ref 0.0–0.2)

## 2019-09-14 MED ORDER — ALTEPLASE 2 MG IJ SOLR
INTRAMUSCULAR | Status: AC | PRN
  Administered 2019-09-14: 1 mg

## 2019-09-14 MED ORDER — FENTANYL CITRATE (PF) 100 MCG/2ML IJ SOLN
INTRAMUSCULAR | Status: AC
  Filled 2019-09-14: qty 2

## 2019-09-14 MED ORDER — HEPARIN SODIUM (PORCINE) 1000 UNIT/ML IJ SOLN
INTRAMUSCULAR | Status: AC | PRN
  Administered 2019-09-14: 3000 [IU] via INTRAVENOUS

## 2019-09-14 MED ORDER — IOHEXOL 300 MG/ML  SOLN
100.0000 mL | Freq: Once | INTRAMUSCULAR | Status: AC | PRN
  Administered 2019-09-14: 40 mL

## 2019-09-14 MED ORDER — ALTEPLASE 2 MG IJ SOLR
INTRAMUSCULAR | Status: AC
  Filled 2019-09-14: qty 4

## 2019-09-14 MED ORDER — SODIUM CHLORIDE 0.9 % IV SOLN: INTRAVENOUS | Status: DC

## 2019-09-14 MED ORDER — MIDAZOLAM HCL 2 MG/2ML IJ SOLN
INTRAMUSCULAR | Status: AC
  Filled 2019-09-14: qty 2

## 2019-09-14 MED ORDER — FENTANYL CITRATE (PF) 100 MCG/2ML IJ SOLN
INTRAMUSCULAR | Status: AC | PRN
  Administered 2019-09-14: 25 ug via INTRAVENOUS

## 2019-09-14 MED ORDER — HEPARIN SODIUM (PORCINE) 1000 UNIT/ML IJ SOLN
INTRAMUSCULAR | Status: AC
  Filled 2019-09-14: qty 1

## 2019-09-14 MED ORDER — LIDOCAINE HCL 1 % IJ SOLN
INTRAMUSCULAR | Status: AC | PRN
  Administered 2019-09-14: 8 mL

## 2019-09-14 MED ORDER — LIDOCAINE HCL 1 % IJ SOLN
INTRAMUSCULAR | Status: AC
  Filled 2019-09-14: qty 20

## 2019-09-14 MED ORDER — MIDAZOLAM HCL 2 MG/2ML IJ SOLN
INTRAMUSCULAR | Status: AC | PRN
  Administered 2019-09-14: 1 mg via INTRAVENOUS

## 2019-09-14 NOTE — H&P (Signed)
Chief Complaint: Patient was seen in consultation today for left arm dialysis fistula declot at the request of [place]  Referring Physician(s): [place]  Supervising Physician: Aletta Edouard  Patient Status: [facility] - Out-pt  History of Present Illness: Jesus Ayala is a 68 y.o. male   ESRD Left arm fistula placed 06/2019 Using well until Wed 11/18 Last use 11/16-- complete Wed noted clot Fri was set for declot-- but IR unable to accommodate that day Rescheduled to today  Pt has had no recent surgery No CVA No bleeding from any site  Past Medical History:  Diagnosis Date  . Anemia   . Anxiety    situational  . ESRD (end stage renal disease) ([place])    MWF- [place]  . HLD (hyperlipidemia)   . Hypertension     Past Surgical History:  Procedure Laterality Date  . AV FISTULA PLACEMENT Left 07/14/2019   Procedure: INSERTION OF ARTERIOVENOUS (AV) GORE-TEX GRAFT ARM;  Surgeon: Angelia Mould, MD;  Location: [facility];  Service: Vascular;  Laterality: Left;  . COLONOSCOPY    . HERNIA REPAIR     unbicial  . INSERTION OF DIALYSIS CATHETER Right 06/25/2019   Procedure: INSERTION OF TUNNEL DIALYSIS CATHETER;  Surgeon: Waynetta Sandy, MD;  Location: [place];  Service: Vascular;  Laterality: Right;  . TEE WITHOUT CARDIOVERSION N/A 08/31/2019   Procedure: TRANSESOPHAGEAL ECHOCARDIOGRAM (TEE);  Surgeon: Pixie Casino, MD;  Location: [facility] ENDOSCOPY;  Service: Cardiovascular;  Laterality: N/A;    Allergies: Ace inhibitors, Enalapril, and Iron sucrose  Medications: Prior to Admission medications   Medication Sig Start Date End Date Taking? Authorizing Provider  acetaminophen (TYLENOL) 500 MG tablet Take 500-1,000 mg by mouth every 6 (six) hours as needed (pain).   Yes [provider]  amLODipine (NORVASC) 10 MG tablet Take 10 mg by mouth daily. 04/12/19  Yes [provider]  aspirin 81 MG chewable tablet Chew 81 mg by  mouth daily.   Yes [provider]  B Complex-C-Zn-Folic Acid (DIALYVITE AB-[id number] 15 PO) Take 1 tablet by mouth daily at 3 pm.   Yes [provider]  calcitRIOL (ROCALTROL) 0.25 MCG capsule Take 0.25 mcg by mouth daily. 06/12/19  Yes [provider]  calcium acetate (PHOSLO) 667 MG capsule Take 1 capsule (667 mg total) by mouth 3 (three) times daily with meals. Patient taking differently: Take 1,334 mg by mouth 3 (three) times daily with meals.  04/22/19  Yes Lavina Hamman, MD  furosemide (LASIX) 80 MG tablet Take 80 mg by mouth daily. 01/22/19  Yes [provider]  hydrALAZINE (APRESOLINE) 25 MG tablet Take 1 tablet (25 mg total) by mouth 3 (three) times daily. 04/22/19  Yes Lavina Hamman, MD  hydrocortisone (ANUSOL-HC) 2.5 % rectal cream Place 1 application rectally 2 (two) times daily. 07/24/19  Yes Deno Etienne, DO  latanoprost (XALATAN) 0.005 % ophthalmic solution Place 1 drop into both eyes at bedtime.   Yes [provider]  metoprolol tartrate (LOPRESSOR) 100 MG tablet Take 100 mg by mouth 2 (two) times a day. 04/12/19  Yes [provider]  traZODone (DESYREL) 50 MG tablet Take 50 mg by mouth at bedtime.   Yes [provider]  nitroGLYCERIN (NITROSTAT) 0.4 MG SL tablet Place 0.4 mg under the tongue every 5 (five) minutes as needed for chest pain.  03/27/19   [provider]  oxyCODONE-acetaminophen (PERCOCET) 5-325 MG tablet Take 1 tablet by mouth every 6 (six) hours as  needed. Patient taking differently: Take 1 tablet by mouth every 6 (six) hours as needed for moderate pain.  07/21/19   Gabriel Earing, PA-C     Family History  Problem Relation Age of Onset  . Lung cancer Father   . Hypertension Brother     Social History   Socioeconomic History  . Marital status: Married    Spouse name: Not on file  . Number of children: Not on file  . Years of education: Not on file  . Highest education level: Not on file   Occupational History  . Not on file  Social Needs  . Financial resource strain: Not on file  . Food insecurity    Worry: Not on file    Inability: Not on file  . Transportation needs    Medical: Not on file    Non-medical: Not on file  Tobacco Use  . Smoking status: Former Smoker    Years: 2.00    Quit date: 1987    Years since quitting: 33.9  . Smokeless tobacco: Never Used  Substance and Sexual Activity  . Alcohol use: Not Currently  . Drug use: No  . Sexual activity: Not on file  Lifestyle  . Physical activity    Days per week: Not on file    Minutes per session: Not on file  . Stress: Not on file  Relationships  . Social Herbalist on phone: Not on file    Gets together: Not on file    Attends religious service: Not on file    Active member of club or organization: Not on file    Attends meetings of clubs or organizations: Not on file    Relationship status: Not on file  Other Topics Concern  . Not on file  Social History Narrative  . Not on file    Review of Systems: A 12 point ROS discussed and pertinent positives are indicated in the HPI above.  All other systems are negative.  Review of Systems  Constitutional: Negative for activity change, fatigue and fever.  Respiratory: Negative for cough and shortness of breath.   Cardiovascular: Negative for chest pain.  Gastrointestinal: Negative for abdominal pain.  Neurological: Negative for weakness.  Psychiatric/Behavioral: Negative for behavioral problems and confusion.    Vital Signs: BP (!) 149/77   Pulse 66   Temp 97.9 F (36.6 C) (Skin)   Resp 16   Ht 5\' 11"  (1.803 m)   Wt 164 lb (74.4 kg)   SpO2 100%   BMI 22.87 kg/m   Physical Exam Vitals signs reviewed.  Cardiovascular:     Rate and Rhythm: Normal rate and regular rhythm.     Heart sounds: Normal heart sounds.  Pulmonary:     Effort: Pulmonary effort is normal.     Breath sounds: Normal breath sounds.  Abdominal:      Palpations: Abdomen is soft.  Musculoskeletal: Normal range of motion.     Comments: Left arm fistula: no thrill; no pulse  Skin:    General: Skin is warm and dry.  Neurological:     Mental Status: He is alert and oriented to person, place, and time.  Psychiatric:        Behavior: Behavior normal.     Imaging: Dg Orthopantogram  Result Date: 08/27/2019 CLINICAL DATA:  Pt is being eval for bacteremia associated with an intravascular line - pt states he believes he has a broken molar or wisdom tooth, not having  any tooth/mouth pain EXAM: ORTHOPANTOGRAM/PANORAMIC COMPARISON:  None. FINDINGS: There is fracture of the crown of tooth 17 surrounding remaining metallic dental filling. Numerous absent teeth. No significant caries. Periapical abscesses are identified. IMPRESSION: Fracture of crown of tooth 17. No periapical abscesses or significant caries. Electronically Signed   By: Nolon Nations M.D.   On: 08/27/2019 12:31   Dg Chest Portable 1 View  Result Date: 08/24/2019 CLINICAL DATA:  Fever EXAM: PORTABLE CHEST 1 VIEW COMPARISON:  06/25/2019 FINDINGS: Right-sided central venous port tip over the SVC. No focal opacity or pleural effusion. Normal heart size. No pneumothorax IMPRESSION: No active disease. Electronically Signed   By: Donavan Foil M.D.   On: 08/24/2019 20:32    Labs:  CBC: Recent Labs    08/28/19 0305 08/29/19 0246 08/30/19 0320 08/31/19 0324  WBC 5.4 5.3 5.4 6.8  HGB 9.6* 9.4* 9.5* 9.2*  HCT 29.4* 28.2* 29.7* 28.6*  PLT 192 198 213 227    COAGS: Recent Labs    04/19/19 0033 06/25/19 0946 08/25/19 0420  INR 1.0 1.1 1.1  APTT  --  34 38*    BMP: Recent Labs    08/28/19 0305 08/29/19 0246 08/30/19 0320 08/31/19 0324  NA 136 136 137 136  K 3.6 3.7 4.3 4.2  CL 96* 97* 100 100  CO2 26 24 25 23   GLUCOSE 85 81 88 82  BUN 28* 36* 26* 35*  CALCIUM 9.1 9.0 9.0 9.1  CREATININE 8.58* 10.36* 7.84* 10.08*  GFRNONAA 6* 5* 6* 5*  GFRAA 7* 5* 7* 6*     LIVER FUNCTION TESTS: Recent Labs    04/21/19 0646 04/22/19 0358 07/24/19 0830 08/24/19 2012  BILITOT  --   --  0.8 0.5  AST  --   --  18 16  ALT  --   --  11 17  ALKPHOS  --   --  57 54  PROT  --   --  7.7 7.5  ALBUMIN 3.4* 3.3* 4.1 4.2    TUMOR MARKERS: No results for input(s): AFPTM, CEA, CA199, CHROMGRNA in the last 8760 hours.  Assessment and Plan:  ESRD L arm fistula clotted since 11/18 Scheduled for left arm dialysis declot with possible angioplasty/stent Possible tunneled dialysis catheter placement Risks and benefits discussed with the patient including, but not limited to bleeding, infection, vascular injury, pulmonary embolism, need for tunneled HD catheter placement or even death.  All of the patient's questions were answered, patient is agreeable to proceed. Consent signed and in chart.    Thank you for this interesting consult.  I greatly enjoyed meeting [employer] and look forward to participating in their care.  A copy of this report was sent to the requesting provider on this date.  Electronically Signed: Lavonia Drafts, PA-C 09/14/2019, 12:15 PM   I spent a total of  30 Minutes   in face to face in clinical consultation, greater than 50% of which was counseling/coordinating care for left arm fistula declot/intervention- possible catheter

## 2019-09-14 NOTE — Progress Notes (Signed)
Discharge instructions reviewed with pt and his wife. Both voice understanding. 

## 2019-09-14 NOTE — Discharge Instructions (Signed)
Dialysis Fistulogram ° °A dialysis fistulogram is an X-ray test to look inside the site where blood is removed and returned to your body during dialysis. °Fistulas and grafts provide easy and effective access for dialysis. However, sometimes problems can occur. The fistula or graft can become clogged or narrow. This can make dialysis less effective. You may need to have a fistulogram to check for these problems. If a problem is found, your health care provider can do treatments during the procedure to clear the blocked or narrowed areas. Treatments to open a blocked dialysis fistula or graft may include: °· Removing the clot from the fistula or graft with a device (thrombectomy). °· A procedure to open or widen the blocked area with a balloon (angioplasty). °· Placing a small mesh tube (stent) to keep the fistula or graft open. °· Injecting a medicine to dissolve the clot (thrombolysis). °Tell a health care provider about: °· Any allergies you have, including any reactions you have had to contrast dye. °· All medicines you are taking, including vitamins, herbs, eye drops, creams, and over-the-counter medicines. °· Any problems you or family members have had with anesthetic medicines. °· Any blood disorders you have. °· Any surgeries you have had. °· Any medical conditions you have. °· Whether you are pregnant or may be pregnant. °· Any pain, redness, or swelling you have in your limb that has the dialysis fistula or graft. °· Any bleeding from your dialysis fistula or graft. °· Any of the following in the part of your body where the dialysis fistula or graft leads: °? Numbness. °? Tingling. °? A cold feeling. °? Areas that are bluish or pale white in color. °What are the risks? °Generally, this is a safe procedure. However, problems may occur, including: °· Infection. °· Bleeding. °· Allergic reactions to medicines or dyes. °· Damage to other structures, such as nearby nerves or blood vessels. °· A blood clot in the  dialysis fistula or graft. °· A blood clot that travels to your lungs (pulmonary embolism). °What happens before the procedure? °General instructions °· Follow instructions from your health care provider about eating and drinking restrictions. °· Ask your health care provider about: °? Changing or stopping your regular medicines. This is especially important if you are taking diabetes medicines or blood thinners (anticoagulants). °? Taking medicines such as aspirin and ibuprofen. These medicines can thin your blood. Do not take these medicines unless your health care provider tells you to take them. °? Taking over-the-counter medicines, vitamins, herbs, and supplements. °· Plan to have someone take you home from the hospital or clinic. °· Plan to have a responsible adult care for you for at least 24 hours after you leave the hospital or clinic. This is important. °· You may have a blood or urine sample taken. These will help your health care provider learn how well your kidneys and liver are working and how well your blood clots. °What happens during the procedure? °· An IV will be inserted into one of your veins. °· You will be given one or more of the following: °? A medicine to help you relax (sedative). °? A medicine to numb the area (local anesthetic) on the limb with your fistula or graft. °· A needle will be inserted into your vein. °· A small, thin tube (catheter) will be inserted into the needle and guided to the area of possible problems. °· Dye (contrast material) will be injected into the catheter. As the contrast material passes through   your body, you may feel warm. °· X-rays will be taken. The contrast material will show where any narrowing or blockages are. °· If narrowing or a blockage is seen, the health care provider may do one of the following to open the blockage: °? Thrombectomy. The health care provider will use a mechanical device at the end of the catheter to break up the  clot. °? Angioplasty. The health care provider will advance a guide wire and balloon-tipped catheter to the blockage site. The balloon will be inflated for a short period of time. A stent may also be placed to keep the blocked area open. °? Thrombolysis. The health care provider will deliver a clot-dissolving medicine through the catheter. °· When the procedure is complete, the catheter will be removed. °· In some cases, the catheter site will be closed with stitches (sutures). °· Pressure will be applied to stop any bleeding, and your skin will be covered with a bandage (dressing). °The procedure may vary among health care providers and hospitals. °What happens after the procedure? °· Your blood pressure, heart rate, breathing rate, and blood oxygen level will be monitored until you leave the hospital or clinic. Your health care provider will also monitor your access site. °· You will need to stay in bed for several hours. °· Do not drive for 24 hours if you were given a sedative during your procedure. °Summary °· A dialysis fistulogram is an X-ray test to look inside the site where blood is removed and returned to your body during dialysis. °· If a problem is found, your health care provider can also do treatments during the procedure to clear the blocked or narrowed areas. °This information is not intended to replace advice given to you by your health care provider. Make sure you discuss any questions you have with your health care provider. °Document Released: 02/22/2014 Document Revised: 11/08/2017 Document Reviewed: 11/08/2017 °Elsevier Patient Education © 2020 [employer] ° °

## 2019-09-14 NOTE — Procedures (Signed)
Interventional Radiology Procedure Note  Procedure: Left forearm dialysis AVGG declot and angioplasty  Complications: None  Estimated Blood Loss: < 10 mL  Findings: Occluded loop forearm graft on left. Opened with mechanical thrombectomy and Fogarty thrombectomy. Stenosis at venous anastomosis treated with 6 mm and 7 mm balloon angioplasty with good result. Graft widely patent on completion.  Venetia Night. Kathlene Cote, M.D Pager:  [phone]

## 2019-09-15 ENCOUNTER — Encounter (HOSPITAL_COMMUNITY): Payer: Self-pay

## 2019-09-15 ENCOUNTER — Other Ambulatory Visit (HOSPITAL_COMMUNITY): Payer: Self-pay | Admitting: Nephrology

## 2019-09-15 ENCOUNTER — Inpatient Hospital Stay: Payer: Medicare Other | Admitting: Internal Medicine

## 2019-09-15 DIAGNOSIS — N186 End stage renal disease: Secondary | ICD-10-CM | POA: Diagnosis not present

## 2019-09-15 DIAGNOSIS — Z992 Dependence on renal dialysis: Secondary | ICD-10-CM | POA: Diagnosis not present

## 2019-09-15 DIAGNOSIS — D631 Anemia in chronic kidney disease: Secondary | ICD-10-CM | POA: Diagnosis not present

## 2019-09-15 DIAGNOSIS — A409 Streptococcal sepsis, unspecified: Secondary | ICD-10-CM | POA: Diagnosis not present

## 2019-09-15 DIAGNOSIS — E876 Hypokalemia: Secondary | ICD-10-CM | POA: Diagnosis not present

## 2019-09-15 DIAGNOSIS — N2581 Secondary hyperparathyroidism of renal origin: Secondary | ICD-10-CM | POA: Diagnosis not present

## 2019-09-18 DIAGNOSIS — N2581 Secondary hyperparathyroidism of renal origin: Secondary | ICD-10-CM | POA: Diagnosis not present

## 2019-09-18 DIAGNOSIS — D631 Anemia in chronic kidney disease: Secondary | ICD-10-CM | POA: Diagnosis not present

## 2019-09-18 DIAGNOSIS — E876 Hypokalemia: Secondary | ICD-10-CM | POA: Diagnosis not present

## 2019-09-18 DIAGNOSIS — A409 Streptococcal sepsis, unspecified: Secondary | ICD-10-CM | POA: Diagnosis not present

## 2019-09-18 DIAGNOSIS — Z992 Dependence on renal dialysis: Secondary | ICD-10-CM | POA: Diagnosis not present

## 2019-09-18 DIAGNOSIS — N186 End stage renal disease: Secondary | ICD-10-CM | POA: Diagnosis not present

## 2019-09-21 DIAGNOSIS — N186 End stage renal disease: Secondary | ICD-10-CM | POA: Diagnosis not present

## 2019-09-21 DIAGNOSIS — D631 Anemia in chronic kidney disease: Secondary | ICD-10-CM | POA: Diagnosis not present

## 2019-09-21 DIAGNOSIS — E876 Hypokalemia: Secondary | ICD-10-CM | POA: Diagnosis not present

## 2019-09-21 DIAGNOSIS — Z992 Dependence on renal dialysis: Secondary | ICD-10-CM | POA: Diagnosis not present

## 2019-09-21 DIAGNOSIS — A409 Streptococcal sepsis, unspecified: Secondary | ICD-10-CM | POA: Diagnosis not present

## 2019-09-21 DIAGNOSIS — N2581 Secondary hyperparathyroidism of renal origin: Secondary | ICD-10-CM | POA: Diagnosis not present

## 2019-09-22 ENCOUNTER — Ambulatory Visit (INDEPENDENT_AMBULATORY_CARE_PROVIDER_SITE_OTHER): Payer: Medicare Other | Admitting: Internal Medicine

## 2019-09-22 ENCOUNTER — Encounter: Payer: Self-pay | Admitting: Internal Medicine

## 2019-09-22 ENCOUNTER — Other Ambulatory Visit: Payer: Self-pay

## 2019-09-22 VITALS — BP 157/81 | HR 70 | Temp 97.9°F | Ht 71.0 in | Wt 164.0 lb

## 2019-09-22 DIAGNOSIS — R7881 Bacteremia: Secondary | ICD-10-CM | POA: Diagnosis not present

## 2019-09-22 DIAGNOSIS — R21 Rash and other nonspecific skin eruption: Secondary | ICD-10-CM

## 2019-09-22 DIAGNOSIS — I248 Other forms of acute ischemic heart disease: Secondary | ICD-10-CM | POA: Diagnosis not present

## 2019-09-22 DIAGNOSIS — N186 End stage renal disease: Secondary | ICD-10-CM | POA: Diagnosis not present

## 2019-09-22 DIAGNOSIS — B9689 Other specified bacterial agents as the cause of diseases classified elsewhere: Secondary | ICD-10-CM

## 2019-09-22 NOTE — Patient Instructions (Signed)
Try hydrocortisone cream for the leg rash

## 2019-09-22 NOTE — Progress Notes (Signed)
   Subjective:    Patient ID: Jesus Ayala, male    DOB: October 02, 1951, 68 y.o.   MRN: [id number]  HPI Here for hsfu Recent ESRD and new AVG and Strep mitis bacteremia.  TEE negative for vegetation.  Completed antibiotics through 11/16 and no issues.  No associated rash or diarrhea.  Had a clot of his AVG but now working again.  No fever or chills.  Complaint of a rash on his left leg.    Review of Systems  Constitutional: Negative for chills, fatigue and fever.  Gastrointestinal: Negative for diarrhea.  Skin: Positive for rash.       Objective:   Physical Exam Constitutional:      Appearance: Normal appearance.  Eyes:     General: No scleral icterus. Cardiovascular:     Rate and Rhythm: Normal rate and regular rhythm.     Heart sounds: No murmur.  Pulmonary:     Effort: Pulmonary effort is normal.  Skin:    Comments: + dry, scaly patch area on left calf  Neurological:     Mental Status: He is alert.  Psychiatric:        Mood and Affect: Mood normal.   SH: no tobacco, quit in June of this year        Assessment & Plan:

## 2019-09-22 NOTE — Assessment & Plan Note (Signed)
New problem.  This looks a bit like eczema, though he has never had this before.  I recommended hydrocortisone cream on it, avoid scratching it.

## 2019-09-22 NOTE — Assessment & Plan Note (Signed)
His AVG is not warm and does not appear infected.

## 2019-09-22 NOTE — Assessment & Plan Note (Signed)
Bacteria cleared and he has completed treatment.  No scurrent symptoms and can rtc PRn

## 2019-10-01 DIAGNOSIS — Z1211 Encounter for screening for malignant neoplasm of colon: Secondary | ICD-10-CM | POA: Diagnosis not present

## 2019-10-01 DIAGNOSIS — I10 Essential (primary) hypertension: Secondary | ICD-10-CM | POA: Diagnosis not present

## 2019-10-01 DIAGNOSIS — F5101 Primary insomnia: Secondary | ICD-10-CM | POA: Insufficient documentation

## 2019-10-23 DIAGNOSIS — I129 Hypertensive chronic kidney disease with stage 1 through stage 4 chronic kidney disease, or unspecified chronic kidney disease: Secondary | ICD-10-CM | POA: Diagnosis not present

## 2019-10-23 DIAGNOSIS — Z992 Dependence on renal dialysis: Secondary | ICD-10-CM | POA: Diagnosis not present

## 2019-10-23 DIAGNOSIS — N186 End stage renal disease: Secondary | ICD-10-CM | POA: Diagnosis not present

## 2019-10-24 DIAGNOSIS — N186 End stage renal disease: Secondary | ICD-10-CM | POA: Diagnosis not present

## 2019-10-24 DIAGNOSIS — D689 Coagulation defect, unspecified: Secondary | ICD-10-CM | POA: Diagnosis not present

## 2019-10-24 DIAGNOSIS — Z992 Dependence on renal dialysis: Secondary | ICD-10-CM | POA: Diagnosis not present

## 2019-10-24 DIAGNOSIS — R52 Pain, unspecified: Secondary | ICD-10-CM | POA: Diagnosis not present

## 2019-10-24 DIAGNOSIS — N2581 Secondary hyperparathyroidism of renal origin: Secondary | ICD-10-CM | POA: Diagnosis not present

## 2019-10-24 DIAGNOSIS — E876 Hypokalemia: Secondary | ICD-10-CM | POA: Diagnosis not present

## 2019-10-26 DIAGNOSIS — Z992 Dependence on renal dialysis: Secondary | ICD-10-CM | POA: Diagnosis not present

## 2019-10-26 DIAGNOSIS — N186 End stage renal disease: Secondary | ICD-10-CM | POA: Diagnosis not present

## 2019-10-26 DIAGNOSIS — D689 Coagulation defect, unspecified: Secondary | ICD-10-CM | POA: Diagnosis not present

## 2019-10-26 DIAGNOSIS — E876 Hypokalemia: Secondary | ICD-10-CM | POA: Diagnosis not present

## 2019-10-26 DIAGNOSIS — N2581 Secondary hyperparathyroidism of renal origin: Secondary | ICD-10-CM | POA: Diagnosis not present

## 2019-10-26 DIAGNOSIS — R52 Pain, unspecified: Secondary | ICD-10-CM | POA: Diagnosis not present

## 2019-10-28 DIAGNOSIS — D689 Coagulation defect, unspecified: Secondary | ICD-10-CM | POA: Diagnosis not present

## 2019-10-28 DIAGNOSIS — N186 End stage renal disease: Secondary | ICD-10-CM | POA: Diagnosis not present

## 2019-10-28 DIAGNOSIS — N2581 Secondary hyperparathyroidism of renal origin: Secondary | ICD-10-CM | POA: Diagnosis not present

## 2019-10-28 DIAGNOSIS — E876 Hypokalemia: Secondary | ICD-10-CM | POA: Diagnosis not present

## 2019-10-28 DIAGNOSIS — R52 Pain, unspecified: Secondary | ICD-10-CM | POA: Diagnosis not present

## 2019-10-28 DIAGNOSIS — Z992 Dependence on renal dialysis: Secondary | ICD-10-CM | POA: Diagnosis not present

## 2019-10-30 DIAGNOSIS — N186 End stage renal disease: Secondary | ICD-10-CM | POA: Diagnosis not present

## 2019-10-30 DIAGNOSIS — D689 Coagulation defect, unspecified: Secondary | ICD-10-CM | POA: Diagnosis not present

## 2019-10-30 DIAGNOSIS — E876 Hypokalemia: Secondary | ICD-10-CM | POA: Diagnosis not present

## 2019-10-30 DIAGNOSIS — R52 Pain, unspecified: Secondary | ICD-10-CM | POA: Diagnosis not present

## 2019-10-30 DIAGNOSIS — N2581 Secondary hyperparathyroidism of renal origin: Secondary | ICD-10-CM | POA: Diagnosis not present

## 2019-10-30 DIAGNOSIS — Z992 Dependence on renal dialysis: Secondary | ICD-10-CM | POA: Diagnosis not present

## 2019-11-02 DIAGNOSIS — N186 End stage renal disease: Secondary | ICD-10-CM | POA: Diagnosis not present

## 2019-11-02 DIAGNOSIS — R52 Pain, unspecified: Secondary | ICD-10-CM | POA: Diagnosis not present

## 2019-11-02 DIAGNOSIS — Z992 Dependence on renal dialysis: Secondary | ICD-10-CM | POA: Diagnosis not present

## 2019-11-02 DIAGNOSIS — E876 Hypokalemia: Secondary | ICD-10-CM | POA: Diagnosis not present

## 2019-11-02 DIAGNOSIS — D689 Coagulation defect, unspecified: Secondary | ICD-10-CM | POA: Diagnosis not present

## 2019-11-02 DIAGNOSIS — N2581 Secondary hyperparathyroidism of renal origin: Secondary | ICD-10-CM | POA: Diagnosis not present

## 2019-11-04 DIAGNOSIS — R52 Pain, unspecified: Secondary | ICD-10-CM | POA: Diagnosis not present

## 2019-11-04 DIAGNOSIS — N2581 Secondary hyperparathyroidism of renal origin: Secondary | ICD-10-CM | POA: Diagnosis not present

## 2019-11-04 DIAGNOSIS — Z992 Dependence on renal dialysis: Secondary | ICD-10-CM | POA: Diagnosis not present

## 2019-11-04 DIAGNOSIS — N186 End stage renal disease: Secondary | ICD-10-CM | POA: Diagnosis not present

## 2019-11-04 DIAGNOSIS — D689 Coagulation defect, unspecified: Secondary | ICD-10-CM | POA: Diagnosis not present

## 2019-11-04 DIAGNOSIS — E876 Hypokalemia: Secondary | ICD-10-CM | POA: Diagnosis not present

## 2019-11-05 DIAGNOSIS — Z992 Dependence on renal dialysis: Secondary | ICD-10-CM | POA: Diagnosis not present

## 2019-11-05 DIAGNOSIS — R2242 Localized swelling, mass and lump, left lower limb: Secondary | ICD-10-CM | POA: Diagnosis not present

## 2019-11-05 DIAGNOSIS — I129 Hypertensive chronic kidney disease with stage 1 through stage 4 chronic kidney disease, or unspecified chronic kidney disease: Secondary | ICD-10-CM | POA: Diagnosis not present

## 2019-11-05 DIAGNOSIS — R002 Palpitations: Secondary | ICD-10-CM | POA: Diagnosis not present

## 2019-11-05 DIAGNOSIS — Z7982 Long term (current) use of aspirin: Secondary | ICD-10-CM | POA: Diagnosis not present

## 2019-11-05 DIAGNOSIS — R079 Chest pain, unspecified: Secondary | ICD-10-CM | POA: Diagnosis not present

## 2019-11-05 DIAGNOSIS — E7800 Pure hypercholesterolemia, unspecified: Secondary | ICD-10-CM | POA: Diagnosis not present

## 2019-11-05 DIAGNOSIS — R200 Anesthesia of skin: Secondary | ICD-10-CM | POA: Diagnosis not present

## 2019-11-05 DIAGNOSIS — N183 Chronic kidney disease, stage 3 unspecified: Secondary | ICD-10-CM | POA: Diagnosis not present

## 2019-11-06 DIAGNOSIS — R52 Pain, unspecified: Secondary | ICD-10-CM | POA: Diagnosis not present

## 2019-11-06 DIAGNOSIS — N2581 Secondary hyperparathyroidism of renal origin: Secondary | ICD-10-CM | POA: Diagnosis not present

## 2019-11-06 DIAGNOSIS — E876 Hypokalemia: Secondary | ICD-10-CM | POA: Diagnosis not present

## 2019-11-06 DIAGNOSIS — Z992 Dependence on renal dialysis: Secondary | ICD-10-CM | POA: Diagnosis not present

## 2019-11-06 DIAGNOSIS — N186 End stage renal disease: Secondary | ICD-10-CM | POA: Diagnosis not present

## 2019-11-06 DIAGNOSIS — D689 Coagulation defect, unspecified: Secondary | ICD-10-CM | POA: Diagnosis not present

## 2019-11-09 DIAGNOSIS — R52 Pain, unspecified: Secondary | ICD-10-CM | POA: Diagnosis not present

## 2019-11-09 DIAGNOSIS — D689 Coagulation defect, unspecified: Secondary | ICD-10-CM | POA: Diagnosis not present

## 2019-11-09 DIAGNOSIS — Z992 Dependence on renal dialysis: Secondary | ICD-10-CM | POA: Diagnosis not present

## 2019-11-09 DIAGNOSIS — N186 End stage renal disease: Secondary | ICD-10-CM | POA: Diagnosis not present

## 2019-11-09 DIAGNOSIS — E876 Hypokalemia: Secondary | ICD-10-CM | POA: Diagnosis not present

## 2019-11-09 DIAGNOSIS — N2581 Secondary hyperparathyroidism of renal origin: Secondary | ICD-10-CM | POA: Diagnosis not present

## 2019-11-11 DIAGNOSIS — Z992 Dependence on renal dialysis: Secondary | ICD-10-CM | POA: Diagnosis not present

## 2019-11-11 DIAGNOSIS — N2581 Secondary hyperparathyroidism of renal origin: Secondary | ICD-10-CM | POA: Diagnosis not present

## 2019-11-11 DIAGNOSIS — E876 Hypokalemia: Secondary | ICD-10-CM | POA: Diagnosis not present

## 2019-11-11 DIAGNOSIS — N186 End stage renal disease: Secondary | ICD-10-CM | POA: Diagnosis not present

## 2019-11-11 DIAGNOSIS — R52 Pain, unspecified: Secondary | ICD-10-CM | POA: Diagnosis not present

## 2019-11-11 DIAGNOSIS — D689 Coagulation defect, unspecified: Secondary | ICD-10-CM | POA: Diagnosis not present

## 2019-11-12 DIAGNOSIS — I120 Hypertensive chronic kidney disease with stage 5 chronic kidney disease or end stage renal disease: Secondary | ICD-10-CM | POA: Diagnosis not present

## 2019-11-12 DIAGNOSIS — Z01818 Encounter for other preprocedural examination: Secondary | ICD-10-CM | POA: Diagnosis not present

## 2019-11-12 DIAGNOSIS — N186 End stage renal disease: Secondary | ICD-10-CM | POA: Diagnosis not present

## 2019-11-12 DIAGNOSIS — Z992 Dependence on renal dialysis: Secondary | ICD-10-CM | POA: Diagnosis not present

## 2019-11-13 DIAGNOSIS — E876 Hypokalemia: Secondary | ICD-10-CM | POA: Diagnosis not present

## 2019-11-13 DIAGNOSIS — D689 Coagulation defect, unspecified: Secondary | ICD-10-CM | POA: Diagnosis not present

## 2019-11-13 DIAGNOSIS — Z992 Dependence on renal dialysis: Secondary | ICD-10-CM | POA: Diagnosis not present

## 2019-11-13 DIAGNOSIS — R52 Pain, unspecified: Secondary | ICD-10-CM | POA: Diagnosis not present

## 2019-11-13 DIAGNOSIS — N2581 Secondary hyperparathyroidism of renal origin: Secondary | ICD-10-CM | POA: Diagnosis not present

## 2019-11-13 DIAGNOSIS — N186 End stage renal disease: Secondary | ICD-10-CM | POA: Diagnosis not present

## 2019-11-16 DIAGNOSIS — R52 Pain, unspecified: Secondary | ICD-10-CM | POA: Diagnosis not present

## 2019-11-16 DIAGNOSIS — Z992 Dependence on renal dialysis: Secondary | ICD-10-CM | POA: Diagnosis not present

## 2019-11-16 DIAGNOSIS — N2581 Secondary hyperparathyroidism of renal origin: Secondary | ICD-10-CM | POA: Diagnosis not present

## 2019-11-16 DIAGNOSIS — D689 Coagulation defect, unspecified: Secondary | ICD-10-CM | POA: Diagnosis not present

## 2019-11-16 DIAGNOSIS — E876 Hypokalemia: Secondary | ICD-10-CM | POA: Diagnosis not present

## 2019-11-16 DIAGNOSIS — N186 End stage renal disease: Secondary | ICD-10-CM | POA: Diagnosis not present

## 2019-11-18 ENCOUNTER — Other Ambulatory Visit: Payer: Self-pay

## 2019-11-18 DIAGNOSIS — N186 End stage renal disease: Secondary | ICD-10-CM | POA: Diagnosis not present

## 2019-11-18 DIAGNOSIS — Z992 Dependence on renal dialysis: Secondary | ICD-10-CM

## 2019-11-18 DIAGNOSIS — N2581 Secondary hyperparathyroidism of renal origin: Secondary | ICD-10-CM | POA: Diagnosis not present

## 2019-11-18 DIAGNOSIS — E876 Hypokalemia: Secondary | ICD-10-CM | POA: Diagnosis not present

## 2019-11-18 DIAGNOSIS — D689 Coagulation defect, unspecified: Secondary | ICD-10-CM | POA: Diagnosis not present

## 2019-11-18 DIAGNOSIS — R52 Pain, unspecified: Secondary | ICD-10-CM | POA: Diagnosis not present

## 2019-11-19 ENCOUNTER — Ambulatory Visit (INDEPENDENT_AMBULATORY_CARE_PROVIDER_SITE_OTHER): Payer: Medicare HMO | Admitting: Physician Assistant

## 2019-11-19 ENCOUNTER — Ambulatory Visit (HOSPITAL_COMMUNITY)
Admission: RE | Admit: 2019-11-19 | Discharge: 2019-11-19 | Disposition: A | Discharge status: Home / Self Care | Payer: Medicare HMO | Source: Ambulatory Visit | Attending: Surgery | Admitting: Surgery

## 2019-11-19 ENCOUNTER — Other Ambulatory Visit: Payer: Self-pay

## 2019-11-19 VITALS — BP 145/82 | HR 100 | Temp 98.6°F | Resp 18 | Ht 71.0 in | Wt 169.5 lb

## 2019-11-19 DIAGNOSIS — N186 End stage renal disease: Secondary | ICD-10-CM | POA: Diagnosis not present

## 2019-11-19 DIAGNOSIS — Z992 Dependence on renal dialysis: Secondary | ICD-10-CM | POA: Insufficient documentation

## 2019-11-19 NOTE — Progress Notes (Signed)
HISTORY AND PHYSICAL     CC:  dialysis access Requesting Provider:  Seward Carol, PA-C  HPI: This is a 69 y.o. male here for evaluation of his hemodialysis access.    The pt underwent a new left FA AVG on 07/14/2019 by Dr. Scot Dock.  On 08/27/2019, he had his tunneled catheter removed.    He states he gets pain on the medial side of the graft when it is stuck.  He states this has been present since they started using the graft.  He does not have pain on the lateral side of the graft.  He has had to have his graft thrombectomized.  States he has some tenderness over the area where the catheter was removed.   He states he is being worked up for a kidney transplant.   If pt on Dialysis:   Dialysis days/center:  M/W/F at the [facility] location  The pt is not on a statin for cholesterol management.  The pt is not diabetic.   The pt is on CCB, BB for hypertension.   Tobacco hx:  remote The pt is on a daily aspirin. Other AC:  none  Past Medical History:  Diagnosis Date  . Anemia   . Anxiety    situational  . ESRD (end stage renal disease) ([place])    MWF- [facility] [place]  . HLD (hyperlipidemia)   . Hypertension     Past Surgical History:  Procedure Laterality Date  . AV FISTULA PLACEMENT Left 07/14/2019   Procedure: INSERTION OF ARTERIOVENOUS (AV) GORE-TEX GRAFT ARM;  Surgeon: Angelia Mould, MD;  Location: [place];  Service: Vascular;  Laterality: Left;  . COLONOSCOPY    . HERNIA REPAIR     unbicial  . INSERTION OF DIALYSIS CATHETER Right 06/25/2019   Procedure: INSERTION OF TUNNEL DIALYSIS CATHETER;  Surgeon: Waynetta Sandy, MD;  Location: [place];  Service: Vascular;  Laterality: Right;  . IR THROMBECTOMY AV FISTULA W/THROMBOLYSIS/PTA INC/SHUNT/IMG LEFT Left 09/14/2019  . IR US GUIDE VASC ACCESS LEFT  09/14/2019  . TEE WITHOUT CARDIOVERSION N/A 08/31/2019   Procedure: TRANSESOPHAGEAL ECHOCARDIOGRAM (TEE);  Surgeon: Pixie Casino, MD;  Location: [facility]  ENDOSCOPY;  Service: Cardiovascular;  Laterality: N/A;    Allergies  Allergen Reactions  . Ace Inhibitors Swelling and Other (See Comments)  . Enalapril Swelling    ANGIOEDEMA of lips  . Iron Sucrose Itching    Current Outpatient Medications  Medication Sig Dispense Refill  . acetaminophen (TYLENOL) 500 MG tablet Take 500-1,000 mg by mouth every 6 (six) hours as needed (pain).    [name] amLODipine (NORVASC) 10 MG tablet Take 10 mg by mouth daily.    [name] aspirin 81 MG chewable tablet Chew 81 mg by mouth daily.    . B Complex-C-Zn-Folic Acid (DIALYVITE [id number] 15 PO) Take 1 tablet by mouth daily at 3 pm.    . calcitRIOL (ROCALTROL) 0.25 MCG capsule Take 0.25 mcg by mouth daily.    . calcium acetate (PHOSLO) 667 MG capsule Take 1 capsule (667 mg total) by mouth 3 (three) times daily with meals. (Patient taking differently: Take 1,334 mg by mouth 3 (three) times daily with meals. ) 90 capsule 0  . furosemide (LASIX) 80 MG tablet Take 80 mg by mouth daily.    . hydrALAZINE (APRESOLINE) 25 MG tablet Take 1 tablet (25 mg total) by mouth 3 (three) times daily. 90 tablet 0  . hydrocortisone (ANUSOL-HC) 2.5 % rectal cream Place 1 application rectally 2 (two)  times daily. 30 g 0  . latanoprost (XALATAN) 0.005 % ophthalmic solution Place 1 drop into both eyes at bedtime.    . metoprolol tartrate (LOPRESSOR) 100 MG tablet Take 100 mg by mouth 2 (two) times a day.    . nitroGLYCERIN (NITROSTAT) 0.4 MG SL tablet Place 0.4 mg under the tongue every 5 (five) minutes as needed for chest pain.     [name] oxyCODONE-acetaminophen (PERCOCET) 5-325 MG tablet Take 1 tablet by mouth every 6 (six) hours as needed. (Patient taking differently: Take 1 tablet by mouth every 6 (six) hours as needed for moderate pain. ) 10 tablet 0  . traZODone (DESYREL) 50 MG tablet Take 50 mg by mouth at bedtime.     No current facility-administered medications for this visit.    Family History  Problem Relation Age of Onset  . Lung cancer  Father   . Hypertension Brother     Social History   Socioeconomic History  . Marital status: Married    Spouse name: Not on file  . Number of children: Not on file  . Years of education: Not on file  . Highest education level: Not on file  Occupational History  . Not on file  Tobacco Use  . Smoking status: Former Smoker    Years: 2.00    Quit date: 1987    Years since quitting: 34.0  . Smokeless tobacco: Never Used  Substance and Sexual Activity  . Alcohol use: Not Currently  . Drug use: No  . Sexual activity: Not on file  Other Topics Concern  . Not on file  Social History Narrative  . Not on file   Social Determinants of Health   Financial Resource Strain:   . Difficulty of Paying Living Expenses: Not on file  Food Insecurity:   . Worried About Charity fundraiser in the Last Year: Not on file  . Ran Out of Food in the Last Year: Not on file  Transportation Needs:   . Lack of Transportation (Medical): Not on file  . Lack of Transportation (Non-Medical): Not on file  Physical Activity:   . Days of Exercise per Week: Not on file  . Minutes of Exercise per Session: Not on file  Stress:   . Feeling of Stress : Not on file  Social Connections:   . Frequency of Communication with Friends and Family: Not on file  . Frequency of Social Gatherings with Friends and Family: Not on file  . Attends Religious Services: Not on file  . Active Member of Clubs or Organizations: Not on file  . Attends Archivist Meetings: Not on file  . Marital Status: Not on file  Intimate Partner Violence:   . Fear of Current or Ex-Partner: Not on file  . Emotionally Abused: Not on file  . Physically Abused: Not on file  . Sexually Abused: Not on file     ROS: [x]  Positive   [ ]  Negative   [ ]  All sytems reviewed and are negative  Cardiac: []  chest pain/pressure []  SOB []  DOE  Vascular: []  pain in legs while walking []  pain in feet when lying flat []  hx of DVT []   swelling in legs  Pulmonary: []  asthma []  wheezing  Neurologic: []  weakness in []  arms []  legs [x]  numbness in his right hand when he is on dialysis.  Does not have numbness in his left hand.  Occasional numbness in his feet on dialysis. [] difficulty speaking or slurred speech []   temporary loss of vision in one eye []  dizziness  Hematologic: []  bleeding problems  GI []  GERD  GU: [x]  CKD/renal failure  [x]  HD---[x]  M/W/F []  T/T/S []  burning with urination []  blood in urine  Psychiatric: []  hx of major depression  Integumentary: []  rashes []  ulcers  Constitutional: []  fever []  chills  PHYSICAL EXAMINATION:  Today's Vitals   11/19/19 0928  BP: (!) 145/82  Pulse: 100  Resp: 18  Temp: 98.6 F (37 C)  TempSrc: Oral  SpO2: 100%  Weight: 169 lb 8 oz (76.9 kg)  Height: 5\' 11"  (1.803 m)   Body mass index is 23.64 kg/m.    General:  WDWN male in NAD Gait: Not observed HENT: WNL Pulmonary: normal non-labored breathing , without Rales, rhonchi,  wheezing Cardiac: regular, without  Murmurs without carotid bruits Skin: without rashes, without ulcers; his skin has healed where [facility] was removed.  Vascular Exam/Pulses:   Right Left  Radial 2+ (normal) 2+ (normal)   Extremities:  without ischemic changes, without Gangrene, without cellulitis; without open wounds;  Musculoskeletal: no muscle wasting or atrophy  Neurologic: A&O X 3; Moving all extremities equally;  Speech is fluent/normal  Non-Invasive Vascular Imaging:   Steal study on 11/19/2019: Summary:  Patent left arteriovenous graft  No significant change in velocity with compression   ASSESSMENT/PLAN: 69 y.o. male with pain when sticking his graft and is here for evaluation of his hemodialysis access  -pt has easily palpable left radial pulse and there is an excellent thrill in his graft.  He does not have evidence of steal.  He does not have pain or numbness in his left hand.    -he does have some pain on  the medial aspect of the graft when accessing it.  Could be due to nerve irritation from tunneling given it has been present since his graft has been accessed.  -would recommend rotating stick sites to help with the longevity of the graft.  -he will f/u as needed.    Leontine Locket, PA-C [facility] [phone]  Clinic MD:   Oneida Alar

## 2019-11-20 DIAGNOSIS — Z992 Dependence on renal dialysis: Secondary | ICD-10-CM | POA: Diagnosis not present

## 2019-11-20 DIAGNOSIS — N186 End stage renal disease: Secondary | ICD-10-CM | POA: Diagnosis not present

## 2019-11-20 DIAGNOSIS — D689 Coagulation defect, unspecified: Secondary | ICD-10-CM | POA: Diagnosis not present

## 2019-11-20 DIAGNOSIS — N2581 Secondary hyperparathyroidism of renal origin: Secondary | ICD-10-CM | POA: Diagnosis not present

## 2019-11-20 DIAGNOSIS — R52 Pain, unspecified: Secondary | ICD-10-CM | POA: Diagnosis not present

## 2019-11-20 DIAGNOSIS — E876 Hypokalemia: Secondary | ICD-10-CM | POA: Diagnosis not present

## 2019-11-23 DIAGNOSIS — L299 Pruritus, unspecified: Secondary | ICD-10-CM | POA: Diagnosis not present

## 2019-11-23 DIAGNOSIS — N2581 Secondary hyperparathyroidism of renal origin: Secondary | ICD-10-CM | POA: Diagnosis not present

## 2019-11-23 DIAGNOSIS — D631 Anemia in chronic kidney disease: Secondary | ICD-10-CM | POA: Diagnosis not present

## 2019-11-23 DIAGNOSIS — I871 Compression of vein: Secondary | ICD-10-CM | POA: Diagnosis not present

## 2019-11-23 DIAGNOSIS — N186 End stage renal disease: Secondary | ICD-10-CM | POA: Diagnosis not present

## 2019-11-23 DIAGNOSIS — T82868A Thrombosis of vascular prosthetic devices, implants and grafts, initial encounter: Secondary | ICD-10-CM | POA: Diagnosis not present

## 2019-11-23 DIAGNOSIS — Z992 Dependence on renal dialysis: Secondary | ICD-10-CM | POA: Diagnosis not present

## 2019-11-23 DIAGNOSIS — D689 Coagulation defect, unspecified: Secondary | ICD-10-CM | POA: Diagnosis not present

## 2019-11-23 DIAGNOSIS — T8249XD Other complication of vascular dialysis catheter, subsequent encounter: Secondary | ICD-10-CM | POA: Diagnosis not present

## 2019-11-23 DIAGNOSIS — E876 Hypokalemia: Secondary | ICD-10-CM | POA: Diagnosis not present

## 2019-11-24 DIAGNOSIS — T82868A Thrombosis of vascular prosthetic devices, implants and grafts, initial encounter: Secondary | ICD-10-CM | POA: Diagnosis not present

## 2019-11-24 DIAGNOSIS — I871 Compression of vein: Secondary | ICD-10-CM | POA: Diagnosis not present

## 2019-11-24 DIAGNOSIS — N186 End stage renal disease: Secondary | ICD-10-CM | POA: Diagnosis not present

## 2019-11-24 DIAGNOSIS — Z992 Dependence on renal dialysis: Secondary | ICD-10-CM | POA: Diagnosis not present

## 2019-11-29 DIAGNOSIS — Z0100 Encounter for examination of eyes and vision without abnormal findings: Secondary | ICD-10-CM | POA: Diagnosis not present

## 2019-12-03 DIAGNOSIS — R0602 Shortness of breath: Secondary | ICD-10-CM | POA: Diagnosis not present

## 2019-12-03 DIAGNOSIS — I120 Hypertensive chronic kidney disease with stage 5 chronic kidney disease or end stage renal disease: Secondary | ICD-10-CM | POA: Diagnosis not present

## 2019-12-03 DIAGNOSIS — R002 Palpitations: Secondary | ICD-10-CM | POA: Diagnosis not present

## 2019-12-03 DIAGNOSIS — Z01818 Encounter for other preprocedural examination: Secondary | ICD-10-CM | POA: Diagnosis not present

## 2019-12-03 DIAGNOSIS — N186 End stage renal disease: Secondary | ICD-10-CM | POA: Diagnosis not present

## 2019-12-08 DIAGNOSIS — I10 Essential (primary) hypertension: Secondary | ICD-10-CM | POA: Diagnosis not present

## 2019-12-08 DIAGNOSIS — R69 Illness, unspecified: Secondary | ICD-10-CM | POA: Diagnosis not present

## 2019-12-08 DIAGNOSIS — R200 Anesthesia of skin: Secondary | ICD-10-CM | POA: Diagnosis not present

## 2019-12-08 DIAGNOSIS — R0601 Orthopnea: Secondary | ICD-10-CM | POA: Diagnosis not present

## 2019-12-11 DIAGNOSIS — E876 Hypokalemia: Secondary | ICD-10-CM | POA: Diagnosis not present

## 2019-12-11 DIAGNOSIS — N186 End stage renal disease: Secondary | ICD-10-CM | POA: Diagnosis not present

## 2019-12-11 DIAGNOSIS — L299 Pruritus, unspecified: Secondary | ICD-10-CM | POA: Diagnosis not present

## 2019-12-11 DIAGNOSIS — D631 Anemia in chronic kidney disease: Secondary | ICD-10-CM | POA: Diagnosis not present

## 2019-12-11 DIAGNOSIS — D689 Coagulation defect, unspecified: Secondary | ICD-10-CM | POA: Diagnosis not present

## 2019-12-11 DIAGNOSIS — T8249XD Other complication of vascular dialysis catheter, subsequent encounter: Secondary | ICD-10-CM | POA: Diagnosis not present

## 2019-12-11 DIAGNOSIS — Z992 Dependence on renal dialysis: Secondary | ICD-10-CM | POA: Diagnosis not present

## 2019-12-11 DIAGNOSIS — N2581 Secondary hyperparathyroidism of renal origin: Secondary | ICD-10-CM | POA: Diagnosis not present

## 2019-12-17 DIAGNOSIS — H2513 Age-related nuclear cataract, bilateral: Secondary | ICD-10-CM | POA: Diagnosis not present

## 2019-12-17 DIAGNOSIS — H463 Toxic optic neuropathy: Secondary | ICD-10-CM | POA: Diagnosis not present

## 2019-12-20 DIAGNOSIS — I129 Hypertensive chronic kidney disease with stage 1 through stage 4 chronic kidney disease, or unspecified chronic kidney disease: Secondary | ICD-10-CM | POA: Diagnosis not present

## 2019-12-20 DIAGNOSIS — N186 End stage renal disease: Secondary | ICD-10-CM | POA: Diagnosis not present

## 2019-12-20 DIAGNOSIS — Z992 Dependence on renal dialysis: Secondary | ICD-10-CM | POA: Diagnosis not present

## 2019-12-21 DIAGNOSIS — T8249XD Other complication of vascular dialysis catheter, subsequent encounter: Secondary | ICD-10-CM | POA: Diagnosis not present

## 2019-12-21 DIAGNOSIS — D689 Coagulation defect, unspecified: Secondary | ICD-10-CM | POA: Diagnosis not present

## 2019-12-21 DIAGNOSIS — Z23 Encounter for immunization: Secondary | ICD-10-CM | POA: Diagnosis not present

## 2019-12-21 DIAGNOSIS — L299 Pruritus, unspecified: Secondary | ICD-10-CM | POA: Diagnosis not present

## 2019-12-21 DIAGNOSIS — D631 Anemia in chronic kidney disease: Secondary | ICD-10-CM | POA: Diagnosis not present

## 2019-12-21 DIAGNOSIS — N186 End stage renal disease: Secondary | ICD-10-CM | POA: Diagnosis not present

## 2019-12-21 DIAGNOSIS — N2581 Secondary hyperparathyroidism of renal origin: Secondary | ICD-10-CM | POA: Diagnosis not present

## 2019-12-21 DIAGNOSIS — E876 Hypokalemia: Secondary | ICD-10-CM | POA: Diagnosis not present

## 2019-12-21 DIAGNOSIS — Z992 Dependence on renal dialysis: Secondary | ICD-10-CM | POA: Diagnosis not present

## 2019-12-24 ENCOUNTER — Ambulatory Visit: Payer: Medicare HMO

## 2019-12-29 DIAGNOSIS — R2242 Localized swelling, mass and lump, left lower limb: Secondary | ICD-10-CM | POA: Diagnosis not present

## 2019-12-31 ENCOUNTER — Ambulatory Visit: Payer: Medicare HMO

## 2019-12-31 ENCOUNTER — Other Ambulatory Visit (HOSPITAL_COMMUNITY): Payer: Medicare HMO

## 2020-01-04 DIAGNOSIS — N2581 Secondary hyperparathyroidism of renal origin: Secondary | ICD-10-CM | POA: Diagnosis not present

## 2020-01-04 DIAGNOSIS — T8249XD Other complication of vascular dialysis catheter, subsequent encounter: Secondary | ICD-10-CM | POA: Diagnosis not present

## 2020-01-04 DIAGNOSIS — Z23 Encounter for immunization: Secondary | ICD-10-CM | POA: Diagnosis not present

## 2020-01-04 DIAGNOSIS — E876 Hypokalemia: Secondary | ICD-10-CM | POA: Diagnosis not present

## 2020-01-04 DIAGNOSIS — D631 Anemia in chronic kidney disease: Secondary | ICD-10-CM | POA: Diagnosis not present

## 2020-01-04 DIAGNOSIS — N186 End stage renal disease: Secondary | ICD-10-CM | POA: Diagnosis not present

## 2020-01-04 DIAGNOSIS — L299 Pruritus, unspecified: Secondary | ICD-10-CM | POA: Diagnosis not present

## 2020-01-04 DIAGNOSIS — D689 Coagulation defect, unspecified: Secondary | ICD-10-CM | POA: Diagnosis not present

## 2020-01-04 DIAGNOSIS — Z992 Dependence on renal dialysis: Secondary | ICD-10-CM | POA: Diagnosis not present

## 2020-01-11 ENCOUNTER — Ambulatory Visit: Payer: Medicare HMO

## 2020-01-14 DIAGNOSIS — R69 Illness, unspecified: Secondary | ICD-10-CM | POA: Diagnosis not present

## 2020-01-14 DIAGNOSIS — R200 Anesthesia of skin: Secondary | ICD-10-CM | POA: Diagnosis not present

## 2020-01-14 DIAGNOSIS — H6982 Other specified disorders of Eustachian tube, left ear: Secondary | ICD-10-CM | POA: Diagnosis not present

## 2020-01-15 ENCOUNTER — Ambulatory Visit: Payer: Medicare HMO

## 2020-01-19 DIAGNOSIS — R69 Illness, unspecified: Secondary | ICD-10-CM | POA: Diagnosis not present

## 2020-01-20 ENCOUNTER — Other Ambulatory Visit: Payer: Self-pay | Admitting: *Deleted

## 2020-01-20 DIAGNOSIS — N186 End stage renal disease: Secondary | ICD-10-CM | POA: Diagnosis not present

## 2020-01-20 DIAGNOSIS — Z992 Dependence on renal dialysis: Secondary | ICD-10-CM | POA: Diagnosis not present

## 2020-01-20 DIAGNOSIS — I129 Hypertensive chronic kidney disease with stage 1 through stage 4 chronic kidney disease, or unspecified chronic kidney disease: Secondary | ICD-10-CM | POA: Diagnosis not present

## 2020-01-21 ENCOUNTER — Other Ambulatory Visit (HOSPITAL_COMMUNITY): Payer: Medicare HMO

## 2020-01-21 ENCOUNTER — Other Ambulatory Visit: Payer: Self-pay

## 2020-01-21 ENCOUNTER — Encounter: Payer: Self-pay | Admitting: Vascular Surgery

## 2020-01-21 ENCOUNTER — Ambulatory Visit (INDEPENDENT_AMBULATORY_CARE_PROVIDER_SITE_OTHER): Payer: Medicare HMO | Admitting: Vascular Surgery

## 2020-01-21 ENCOUNTER — Ambulatory Visit (INDEPENDENT_AMBULATORY_CARE_PROVIDER_SITE_OTHER)
Admission: RE | Admit: 2020-01-21 | Discharge: 2020-01-21 | Disposition: A | Discharge status: Home / Self Care | Payer: Medicare HMO | Source: Ambulatory Visit | Attending: Vascular Surgery | Admitting: Vascular Surgery

## 2020-01-21 ENCOUNTER — Ambulatory Visit (HOSPITAL_COMMUNITY)
Admission: RE | Admit: 2020-01-21 | Discharge: 2020-01-21 | Disposition: A | Discharge status: Home / Self Care | Payer: Medicare HMO | Source: Ambulatory Visit | Attending: Vascular Surgery | Admitting: Vascular Surgery

## 2020-01-21 VITALS — BP 115/77 | HR 78 | Temp 97.1°F | Resp 20 | Ht 71.0 in | Wt 175.0 lb

## 2020-01-21 DIAGNOSIS — Z992 Dependence on renal dialysis: Secondary | ICD-10-CM

## 2020-01-21 DIAGNOSIS — N186 End stage renal disease: Secondary | ICD-10-CM | POA: Insufficient documentation

## 2020-01-21 NOTE — Progress Notes (Signed)
Referring: Dr. Hollie Salk  Patient name: Jesus Ayala MRN: [id number] DOB: Mar 15, 1951 Sex: male  REASON FOR CONSULT: Dialysis access.  HPI: Jesus Ayala is a 69 y.o. male, furred for placement of long-term hemodialysis access.  The patient previously had a left forearm AV graft created by Dr. Scot Dock September 2020.  This is now occluded.  His hemodialysis days Monday Wednesday Friday.  Other medical problems include hypertension hyperlipidemia both of which are currently stable.  Past Medical History:  Diagnosis Date  . Anemia   . Anxiety    situational  . ESRD (end stage renal disease) ([place])    MWF- [facility]   . HLD (hyperlipidemia)   . Hypertension    Past Surgical History:  Procedure Laterality Date  . AV FISTULA PLACEMENT Left 07/14/2019   Procedure: INSERTION OF ARTERIOVENOUS (AV) GORE-TEX GRAFT ARM;  Surgeon: Angelia Mould, MD;  Location: [place];  Service: Vascular;  Laterality: Left;  . COLONOSCOPY    . HERNIA REPAIR     unbicial  . INSERTION OF DIALYSIS CATHETER Right 06/25/2019   Procedure: INSERTION OF TUNNEL DIALYSIS CATHETER;  Surgeon: Waynetta Sandy, MD;  Location: [place];  Service: Vascular;  Laterality: Right;  . IR THROMBECTOMY AV FISTULA W/THROMBOLYSIS/PTA INC/SHUNT/IMG LEFT Left 09/14/2019  . IR US GUIDE VASC ACCESS LEFT  09/14/2019  . TEE WITHOUT CARDIOVERSION N/A 08/31/2019   Procedure: TRANSESOPHAGEAL ECHOCARDIOGRAM (TEE);  Surgeon: Pixie Casino, MD;  Location: [facility] ENDOSCOPY;  Service: Cardiovascular;  Laterality: N/A;    Family History  Problem Relation Age of Onset  . Lung cancer Father   . Hypertension Brother     SOCIAL HISTORY: Social History   Socioeconomic History  . Marital status: Married    Spouse name: Not on file  . Number of children: Not on file  . Years of education: Not on file  . Highest education level: Not on file  Occupational History  . Not on file  Tobacco Use  . Smoking status: Former  Smoker    Years: 2.00    Quit date: 1987    Years since quitting: 34.2  . Smokeless tobacco: Never Used  Substance and Sexual Activity  . Alcohol use: Not Currently  . Drug use: No  . Sexual activity: Not on file  Other Topics Concern  . Not on file  Social History Narrative  . Not on file   Social Determinants of Health   Financial Resource Strain:   . Difficulty of Paying Living Expenses:   Food Insecurity:   . Worried About Charity fundraiser in the Last Year:   . Arboriculturist in the Last Year:   Transportation Needs:   . Film/video editor (Medical):   [name] Lack of Transportation (Non-Medical):   Physical Activity:   . Days of Exercise per Week:   . Minutes of Exercise per Session:   Stress:   . Feeling of Stress :   Social Connections:   . Frequency of Communication with Friends and Family:   . Frequency of Social Gatherings with Friends and Family:   . Attends Religious Services:   . Active Member of Clubs or Organizations:   . Attends Archivist Meetings:   [name] Marital Status:   Intimate Partner Violence:   . Fear of Current or Ex-Partner:   . Emotionally Abused:   [name] Physically Abused:   . Sexually Abused:     Allergies  Allergen Reactions  . Ace Inhibitors Swelling  and Other (See Comments)  . Enalapril Swelling    ANGIOEDEMA of lips  . Iron Sucrose Itching  . Venofer  [Ferric Oxide] Itching    Current Outpatient Medications  Medication Sig Dispense Refill  . acetaminophen (TYLENOL) 500 MG tablet Take 500-1,000 mg by mouth every 6 (six) hours as needed (pain).    [name] amLODipine (NORVASC) 10 MG tablet Take 10 mg by mouth daily.    [name] aspirin 81 MG chewable tablet Chew 81 mg by mouth daily.    . B Complex-C-Zn-Folic Acid (DIALYVITE 833-ASNK 15 PO) Take 1 tablet by mouth daily at 3 pm.    . calcitRIOL (ROCALTROL) 0.25 MCG capsule Take 0.25 mcg by mouth daily.    . calcium acetate (PHOSLO) 667 MG capsule Take 1 capsule (667 mg total) by mouth 3  (three) times daily with meals. (Patient taking differently: Take 1,334 mg by mouth 3 (three) times daily with meals. ) 90 capsule 0  . fluticasone (FLONASE) 50 MCG/ACT nasal spray 1 spray by Each Nare route 2 times daily.    . furosemide (LASIX) 80 MG tablet Take 80 mg by mouth daily.    [name] gabapentin (NEURONTIN) 300 MG capsule Take 300 mg by mouth 2 (two) times daily.    . hydrALAZINE (APRESOLINE) 25 MG tablet Take 1 tablet (25 mg total) by mouth 3 (three) times daily. 90 tablet 0  . hydrocortisone (ANUSOL-HC) 2.5 % rectal cream Place 1 application rectally 2 (two) times daily. 30 g 0  . latanoprost (XALATAN) 0.005 % ophthalmic solution Place 1 drop into both eyes at bedtime.    . Methoxy PEG-Epoetin Beta (MIRCERA IJ) Mircera    . metoprolol tartrate (LOPRESSOR) 100 MG tablet Take 100 mg by mouth 2 (two) times a day.    . nitroGLYCERIN (NITROSTAT) 0.4 MG SL tablet Place 0.4 mg under the tongue every 5 (five) minutes as needed for chest pain.     [name] temazepam (RESTORIL) 7.5 MG capsule Take by mouth.    . traZODone (DESYREL) 50 MG tablet Take 50 mg by mouth at bedtime.     No current facility-administered medications for this visit.    ROS:   General:  No weight loss, Fever, chills  HEENT: No recent headaches, no nasal bleeding, no visual changes, no sore throat  Neurologic: No dizziness, blackouts, seizures. No recent symptoms of stroke or mini- stroke. No recent episodes of slurred speech, or temporary blindness.  Cardiac: No recent episodes of chest pain/pressure, no shortness of breath at rest.  No shortness of breath with exertion.  Denies history of atrial fibrillation or irregular heartbeat  Vascular: No history of rest pain in feet.  No history of claudication.  No history of non-healing ulcer, No history of DVT   Pulmonary: No home oxygen, no productive cough, no hemoptysis,  No asthma or wheezing  Musculoskeletal:  [ ]  Arthritis, [ ]  Low back pain,  [ ]  Joint  pain  Hematologic:No history of hypercoagulable state.  No history of easy bleeding.  No history of anemia  Gastrointestinal: No hematochezia or melena,  No gastroesophageal reflux, no trouble swallowing  Urinary: [X]  chronic Kidney disease, [X]  on HD - [X]  MWF or [ ]  TTHS, [ ]  Burning with urination, [ ]  Frequent urination, [ ]  Difficulty urinating;   Skin: No rashes  Psychological: No history of anxiety,  No history of depression   Physical Examination  Vitals:   01/21/20 0831  BP: 115/77  Pulse: 78  Resp: 20  Temp: (!) 97.1 F (36.2 C)  SpO2: 99%  Weight: 175 lb (79.4 kg)  Height: 5\' 11"  (1.803 m)    Body mass index is 24.41 kg/m.  General:  Alert and oriented, no acute distress HEENT: Normal Neck: No JVD Cardiac: Regular Rate and Rhythm Skin: No rash Extremity Pulses:  2+ radial, brachial pulses bilaterally thrombosed left forearm graft Musculoskeletal: No deformity or edema  Neurologic: Upper and lower extremity motor 5/5 and symmetric  DATA:  Marginal veins bilaterally on my evaluation with the SonoSite at the bedside.  Formal vein mapping today showed right cephalic and basilic vein were 3 to 4 mm over most of the course but with possible areas of narrowing.  Left basilic vein was 3 mm.  ASSESSMENT: Patient needs new long-term hemodialysis access.  He is reluctant to proceed with another AV graft.  His veins are fairly marginal my exam but  Vein mapping suggested that the right cephalic vein may be 3 mm.  Right basilic vein was 4 mm.  I think that the best option at this point would be to try to place a right arm AV fistula.  He knows that this may have a 15 to 20% chance that it may not mature but he would prefer this to an AV graft.  He currently is dialyzing via catheter.   PLAN: Right arm AV fistula cephalic versus basilic vein February 09, 4460.   Ruta Hinds, MD [facility] [place] Office: [phone] Pager: [phone]

## 2020-01-22 DIAGNOSIS — N186 End stage renal disease: Secondary | ICD-10-CM | POA: Diagnosis not present

## 2020-01-22 DIAGNOSIS — E876 Hypokalemia: Secondary | ICD-10-CM | POA: Diagnosis not present

## 2020-01-22 DIAGNOSIS — D689 Coagulation defect, unspecified: Secondary | ICD-10-CM | POA: Diagnosis not present

## 2020-01-22 DIAGNOSIS — T8249XD Other complication of vascular dialysis catheter, subsequent encounter: Secondary | ICD-10-CM | POA: Diagnosis not present

## 2020-01-22 DIAGNOSIS — N2581 Secondary hyperparathyroidism of renal origin: Secondary | ICD-10-CM | POA: Diagnosis not present

## 2020-01-22 DIAGNOSIS — L02213 Cutaneous abscess of chest wall: Secondary | ICD-10-CM | POA: Diagnosis not present

## 2020-01-22 DIAGNOSIS — D631 Anemia in chronic kidney disease: Secondary | ICD-10-CM | POA: Diagnosis not present

## 2020-01-22 DIAGNOSIS — Z992 Dependence on renal dialysis: Secondary | ICD-10-CM | POA: Diagnosis not present

## 2020-01-28 DIAGNOSIS — N2581 Secondary hyperparathyroidism of renal origin: Secondary | ICD-10-CM | POA: Diagnosis not present

## 2020-01-28 DIAGNOSIS — I10 Essential (primary) hypertension: Secondary | ICD-10-CM | POA: Diagnosis not present

## 2020-01-28 DIAGNOSIS — G5711 Meralgia paresthetica, right lower limb: Secondary | ICD-10-CM | POA: Diagnosis not present

## 2020-01-28 DIAGNOSIS — R69 Illness, unspecified: Secondary | ICD-10-CM | POA: Diagnosis not present

## 2020-02-01 DIAGNOSIS — L02213 Cutaneous abscess of chest wall: Secondary | ICD-10-CM | POA: Insufficient documentation

## 2020-02-04 DIAGNOSIS — Z1211 Encounter for screening for malignant neoplasm of colon: Secondary | ICD-10-CM | POA: Diagnosis not present

## 2020-02-04 DIAGNOSIS — R935 Abnormal findings on diagnostic imaging of other abdominal regions, including retroperitoneum: Secondary | ICD-10-CM | POA: Diagnosis not present

## 2020-02-04 DIAGNOSIS — Z01818 Encounter for other preprocedural examination: Secondary | ICD-10-CM | POA: Diagnosis not present

## 2020-02-06 ENCOUNTER — Other Ambulatory Visit (HOSPITAL_COMMUNITY)
Admission: RE | Admit: 2020-02-06 | Discharge: 2020-02-06 | Disposition: A | Discharge status: Home / Self Care | Payer: Medicare HMO | Source: Ambulatory Visit | Attending: Vascular Surgery | Admitting: Vascular Surgery

## 2020-02-06 DIAGNOSIS — Z01812 Encounter for preprocedural laboratory examination: Secondary | ICD-10-CM | POA: Diagnosis not present

## 2020-02-06 DIAGNOSIS — Z20822 Contact with and (suspected) exposure to covid-19: Secondary | ICD-10-CM | POA: Diagnosis not present

## 2020-02-06 LAB — SARS CORONAVIRUS 2 (TAT 6-24 HRS): SARS Coronavirus 2: NEGATIVE

## 2020-02-08 ENCOUNTER — Other Ambulatory Visit: Payer: Self-pay

## 2020-02-08 ENCOUNTER — Encounter (HOSPITAL_COMMUNITY): Payer: Self-pay | Admitting: Vascular Surgery

## 2020-02-08 DIAGNOSIS — Z992 Dependence on renal dialysis: Secondary | ICD-10-CM | POA: Diagnosis not present

## 2020-02-08 DIAGNOSIS — D689 Coagulation defect, unspecified: Secondary | ICD-10-CM | POA: Diagnosis not present

## 2020-02-08 DIAGNOSIS — L02213 Cutaneous abscess of chest wall: Secondary | ICD-10-CM | POA: Diagnosis not present

## 2020-02-08 DIAGNOSIS — E876 Hypokalemia: Secondary | ICD-10-CM | POA: Diagnosis not present

## 2020-02-08 DIAGNOSIS — T8249XD Other complication of vascular dialysis catheter, subsequent encounter: Secondary | ICD-10-CM | POA: Diagnosis not present

## 2020-02-08 DIAGNOSIS — N186 End stage renal disease: Secondary | ICD-10-CM | POA: Diagnosis not present

## 2020-02-08 DIAGNOSIS — N2581 Secondary hyperparathyroidism of renal origin: Secondary | ICD-10-CM | POA: Diagnosis not present

## 2020-02-08 DIAGNOSIS — D631 Anemia in chronic kidney disease: Secondary | ICD-10-CM | POA: Diagnosis not present

## 2020-02-08 NOTE — Pre-Procedure Instructions (Signed)
   Jesus Ayala  02/08/2020    Your procedure is scheduled on Tuesday, February 09, 2020  Report to [facility] Admitting at 6:55 A.M.  Call this number if you have problems the morning of surgery:  [phone]   Remember: Brush your teeth the morning of surgery with your regular toothpaste.   Do not eat or drink after midnight.    Take these medicines the morning of surgery with A SIP OF WATER :   amLODipine (NORVASC)   Aspirin  gabapentin (NEURONTIN)  metoprolol tartrate (LOPRESSOR)  fluticasone (FLONASE) 50 MCG/ACT nasal spray  If needed: acetaminophen (TYLENOL) for pain If needed: nitroGLYCERIN (NITROSTAT) for chest pain  Stop taking vitamins, fish oil and herbal medications. Do not take any NSAIDs ie: Ibuprofen, Advil, Naproxen (Aleve), Motrin, BC and Goody Powder; stop now.   Do not wear jewelry  Do not wear lotions, powders, or perfumes, or deodorant.  Do not shave 48 hours prior to surgery.  Men may shave face and neck.  Do not bring valuables to the hospital.  [facility] is not responsible for any belongings or valuables.  Contacts, dentures or bridgework may not be worn into surgery.   Patients discharged the day of surgery will not be allowed to drive home.   Please read over the following fact sheets that you were given.

## 2020-02-08 NOTE — Progress Notes (Signed)
Pt denies SOB, chest pain, and being under the care of a cardiologist. Pt stated that PCP is Wilnette Kales, PA. Pt denies having a cardiac cath. Pt made aware to stop taking vitamins, fish oil and herbal medications. Do not take any NSAIDs ie: Ibuprofen, Advil, Naproxen (Aleve), Motrin, BC and Goody Powder. Pt reminded to quarantine. Pt verbalized understanding of all pre-op instructions.

## 2020-02-19 DIAGNOSIS — N186 End stage renal disease: Secondary | ICD-10-CM | POA: Diagnosis not present

## 2020-02-19 DIAGNOSIS — Z992 Dependence on renal dialysis: Secondary | ICD-10-CM | POA: Diagnosis not present

## 2020-02-19 DIAGNOSIS — I129 Hypertensive chronic kidney disease with stage 1 through stage 4 chronic kidney disease, or unspecified chronic kidney disease: Secondary | ICD-10-CM | POA: Diagnosis not present

## 2020-02-22 ENCOUNTER — Other Ambulatory Visit: Payer: Self-pay

## 2020-02-22 ENCOUNTER — Encounter (HOSPITAL_COMMUNITY): Payer: Self-pay | Admitting: Vascular Surgery

## 2020-02-22 ENCOUNTER — Other Ambulatory Visit (HOSPITAL_COMMUNITY)
Admission: RE | Admit: 2020-02-22 | Discharge: 2020-02-22 | Disposition: A | Discharge status: Home / Self Care | Payer: Medicare HMO | Source: Ambulatory Visit | Attending: Vascular Surgery | Admitting: Vascular Surgery

## 2020-02-22 ENCOUNTER — Other Ambulatory Visit (HOSPITAL_COMMUNITY): Payer: Medicare HMO

## 2020-02-22 DIAGNOSIS — Z01812 Encounter for preprocedural laboratory examination: Secondary | ICD-10-CM | POA: Diagnosis not present

## 2020-02-22 DIAGNOSIS — Z20822 Contact with and (suspected) exposure to covid-19: Secondary | ICD-10-CM | POA: Diagnosis not present

## 2020-02-22 DIAGNOSIS — N2581 Secondary hyperparathyroidism of renal origin: Secondary | ICD-10-CM | POA: Diagnosis not present

## 2020-02-22 DIAGNOSIS — D689 Coagulation defect, unspecified: Secondary | ICD-10-CM | POA: Diagnosis not present

## 2020-02-22 DIAGNOSIS — L299 Pruritus, unspecified: Secondary | ICD-10-CM | POA: Diagnosis not present

## 2020-02-22 DIAGNOSIS — E876 Hypokalemia: Secondary | ICD-10-CM | POA: Diagnosis not present

## 2020-02-22 DIAGNOSIS — N186 End stage renal disease: Secondary | ICD-10-CM | POA: Diagnosis not present

## 2020-02-22 DIAGNOSIS — D631 Anemia in chronic kidney disease: Secondary | ICD-10-CM | POA: Diagnosis not present

## 2020-02-22 DIAGNOSIS — T8249XD Other complication of vascular dialysis catheter, subsequent encounter: Secondary | ICD-10-CM | POA: Diagnosis not present

## 2020-02-22 DIAGNOSIS — Z992 Dependence on renal dialysis: Secondary | ICD-10-CM | POA: Diagnosis not present

## 2020-02-22 LAB — SARS CORONAVIRUS 2 (TAT 6-24 HRS): SARS Coronavirus 2: NEGATIVE

## 2020-02-22 NOTE — Progress Notes (Signed)
Nurse requested EKG tracing from [facility]; awaiting response.

## 2020-02-22 NOTE — Progress Notes (Signed)
Pt denies SOB, chest pain, and being under the care of a cardiologist. Pt stated that PCP is Wilnette Kales, PA. Pt denies having a cardiac cath. Pt made aware to stop taking vitamins, fish oil and herbal medications. Do not take any NSAIDs ie: Ibuprofen, Advil, Naproxen (Aleve), Motrin, BC and Goody Powder. Pt reminded to quarantine. Pt verbalized understanding of all pre-op instructions.

## 2020-02-23 ENCOUNTER — Encounter (HOSPITAL_COMMUNITY)
Admission: RE | Disposition: A | Discharge status: Home / Self Care | Payer: Self-pay | Source: Home / Self Care | Attending: Vascular Surgery

## 2020-02-23 ENCOUNTER — Ambulatory Visit (HOSPITAL_COMMUNITY)
Admission: RE | Admit: 2020-02-23 | Discharge: 2020-02-23 | Disposition: A | Discharge status: Home / Self Care | Payer: Medicare HMO | Attending: Vascular Surgery | Admitting: Vascular Surgery

## 2020-02-23 ENCOUNTER — Ambulatory Visit (HOSPITAL_COMMUNITY): Payer: Medicare HMO | Admitting: Anesthesiology

## 2020-02-23 ENCOUNTER — Encounter (HOSPITAL_COMMUNITY): Payer: Self-pay | Admitting: Vascular Surgery

## 2020-02-23 ENCOUNTER — Other Ambulatory Visit: Payer: Self-pay

## 2020-02-23 DIAGNOSIS — N186 End stage renal disease: Secondary | ICD-10-CM | POA: Diagnosis not present

## 2020-02-23 DIAGNOSIS — D631 Anemia in chronic kidney disease: Secondary | ICD-10-CM | POA: Insufficient documentation

## 2020-02-23 DIAGNOSIS — R69 Illness, unspecified: Secondary | ICD-10-CM | POA: Diagnosis not present

## 2020-02-23 DIAGNOSIS — E785 Hyperlipidemia, unspecified: Secondary | ICD-10-CM | POA: Diagnosis not present

## 2020-02-23 DIAGNOSIS — Z79899 Other long term (current) drug therapy: Secondary | ICD-10-CM | POA: Diagnosis not present

## 2020-02-23 DIAGNOSIS — M1990 Unspecified osteoarthritis, unspecified site: Secondary | ICD-10-CM | POA: Insufficient documentation

## 2020-02-23 DIAGNOSIS — Z87891 Personal history of nicotine dependence: Secondary | ICD-10-CM | POA: Insufficient documentation

## 2020-02-23 DIAGNOSIS — Z888 Allergy status to other drugs, medicaments and biological substances status: Secondary | ICD-10-CM | POA: Diagnosis not present

## 2020-02-23 DIAGNOSIS — X58XXXA Exposure to other specified factors, initial encounter: Secondary | ICD-10-CM | POA: Insufficient documentation

## 2020-02-23 DIAGNOSIS — T82898A Other specified complication of vascular prosthetic devices, implants and grafts, initial encounter: Secondary | ICD-10-CM | POA: Diagnosis not present

## 2020-02-23 DIAGNOSIS — Z801 Family history of malignant neoplasm of trachea, bronchus and lung: Secondary | ICD-10-CM | POA: Insufficient documentation

## 2020-02-23 DIAGNOSIS — F430 Acute stress reaction: Secondary | ICD-10-CM | POA: Insufficient documentation

## 2020-02-23 DIAGNOSIS — Z7982 Long term (current) use of aspirin: Secondary | ICD-10-CM | POA: Insufficient documentation

## 2020-02-23 DIAGNOSIS — N185 Chronic kidney disease, stage 5: Secondary | ICD-10-CM | POA: Diagnosis not present

## 2020-02-23 DIAGNOSIS — I120 Hypertensive chronic kidney disease with stage 5 chronic kidney disease or end stage renal disease: Secondary | ICD-10-CM | POA: Insufficient documentation

## 2020-02-23 DIAGNOSIS — Z8249 Family history of ischemic heart disease and other diseases of the circulatory system: Secondary | ICD-10-CM | POA: Insufficient documentation

## 2020-02-23 DIAGNOSIS — Z992 Dependence on renal dialysis: Secondary | ICD-10-CM | POA: Insufficient documentation

## 2020-02-23 HISTORY — DX: Unspecified osteoarthritis, unspecified site: M19.90

## 2020-02-23 HISTORY — DX: Presence of spectacles and contact lenses: Z97.3

## 2020-02-23 HISTORY — DX: Unspecified cataract: H26.9

## 2020-02-23 HISTORY — PX: AV FISTULA PLACEMENT: SHX1204

## 2020-02-23 HISTORY — DX: Presence of dental prosthetic device (complete) (partial): Z97.2

## 2020-02-23 LAB — POCT I-STAT, CHEM 8
BUN: 35 mg/dL — ABNORMAL HIGH (ref 8–23)
Calcium, Ion: 1.21 mmol/L (ref 1.15–1.40)
Chloride: 99 mmol/L (ref 98–111)
Creatinine, Ser: 11.3 mg/dL — ABNORMAL HIGH (ref 0.61–1.24)
Glucose, Bld: 89 mg/dL (ref 70–99)
HCT: 34 % — ABNORMAL LOW (ref 39.0–52.0)
Hemoglobin: 11.6 g/dL — ABNORMAL LOW (ref 13.0–17.0)
Potassium: 5.1 mmol/L (ref 3.5–5.1)
Sodium: 136 mmol/L (ref 135–145)
TCO2: 27 mmol/L (ref 22–32)

## 2020-02-23 SURGERY — ARTERIOVENOUS (AV) FISTULA CREATION
Anesthesia: Monitor Anesthesia Care | Laterality: Right

## 2020-02-23 MED ORDER — EPHEDRINE SULFATE 50 MG/ML IJ SOLN
INTRAMUSCULAR | Status: DC | PRN
Start: 2020-02-23 — End: 2020-02-23
  Administered 2020-02-23: 10 mg via INTRAVENOUS

## 2020-02-23 MED ORDER — METOPROLOL TARTRATE 50 MG PO TABS
100.0000 mg | ORAL_TABLET | Freq: Once | ORAL | Status: AC
  Administered 2020-02-23: 100 mg via ORAL

## 2020-02-23 MED ORDER — PROPOFOL 10 MG/ML IV BOLUS
INTRAVENOUS | Status: DC | PRN
  Administered 2020-02-23: 20 mg via INTRAVENOUS

## 2020-02-23 MED ORDER — FENTANYL CITRATE (PF) 100 MCG/2ML IJ SOLN
INTRAMUSCULAR | Status: DC | PRN
  Administered 2020-02-23: 25 ug via INTRAVENOUS

## 2020-02-23 MED ORDER — CHLORHEXIDINE GLUCONATE 4 % EX LIQD: 60.0000 mL | Freq: Once | CUTANEOUS | Status: DC

## 2020-02-23 MED ORDER — HEPARIN SODIUM (PORCINE) 1000 UNIT/ML IJ SOLN
1.9000 mL | Freq: Once | INTRAMUSCULAR | Status: AC
  Administered 2020-02-23: 1.9 mL via INTRAVENOUS

## 2020-02-23 MED ORDER — LIDOCAINE HCL (PF) 1 % IJ SOLN
INTRAMUSCULAR | Status: AC
  Filled 2020-02-23: qty 60

## 2020-02-23 MED ORDER — SODIUM CHLORIDE 0.9 % IV SOLN
INTRAVENOUS | Status: DC | PRN
  Administered 2020-02-23: 500 mL

## 2020-02-23 MED ORDER — MIDAZOLAM HCL 2 MG/2ML IJ SOLN
INTRAMUSCULAR | Status: AC
  Filled 2020-02-23: qty 2

## 2020-02-23 MED ORDER — LIDOCAINE 2% (20 MG/ML) 5 ML SYRINGE
INTRAMUSCULAR | Status: AC
  Filled 2020-02-23: qty 5

## 2020-02-23 MED ORDER — 0.9 % SODIUM CHLORIDE (POUR BTL) OPTIME
TOPICAL | Status: DC | PRN
  Administered 2020-02-23: 1000 mL

## 2020-02-23 MED ORDER — SODIUM CHLORIDE 0.9 % IV SOLN: INTRAVENOUS | Status: DC

## 2020-02-23 MED ORDER — MIDAZOLAM HCL 2 MG/2ML IJ SOLN: 0.5000 mg | Freq: Once | INTRAMUSCULAR | Status: DC | PRN

## 2020-02-23 MED ORDER — PROPOFOL 10 MG/ML IV BOLUS
INTRAVENOUS | Status: AC
  Filled 2020-02-23: qty 20

## 2020-02-23 MED ORDER — HYDROMORPHONE HCL 1 MG/ML IJ SOLN: 0.2500 mg | INTRAMUSCULAR | Status: DC | PRN

## 2020-02-23 MED ORDER — EPHEDRINE 5 MG/ML INJ
INTRAVENOUS | Status: AC
  Filled 2020-02-23: qty 10

## 2020-02-23 MED ORDER — ONDANSETRON HCL 4 MG/2ML IJ SOLN
INTRAMUSCULAR | Status: AC
  Filled 2020-02-23: qty 2

## 2020-02-23 MED ORDER — PHENYLEPHRINE HCL-NACL 10-0.9 MG/250ML-% IV SOLN
INTRAVENOUS | Status: DC | PRN
  Administered 2020-02-23: 50 ug/min via INTRAVENOUS

## 2020-02-23 MED ORDER — MEPERIDINE HCL 25 MG/ML IJ SOLN: 6.2500 mg | INTRAMUSCULAR | Status: DC | PRN

## 2020-02-23 MED ORDER — PROMETHAZINE HCL 25 MG/ML IJ SOLN: 6.2500 mg | INTRAMUSCULAR | Status: DC | PRN

## 2020-02-23 MED ORDER — EPHEDRINE SULFATE 50 MG/ML IJ SOLN
INTRAMUSCULAR | Status: DC | PRN
  Administered 2020-02-23: 10 mg via INTRAVENOUS

## 2020-02-23 MED ORDER — CEFAZOLIN SODIUM-DEXTROSE 2-4 GM/100ML-% IV SOLN
2.0000 g | INTRAVENOUS | Status: AC
  Administered 2020-02-23: 10:00:00 2 g via INTRAVENOUS
  Filled 2020-02-23: qty 100

## 2020-02-23 MED ORDER — LIDOCAINE HCL (PF) 1 % IJ SOLN
INTRAMUSCULAR | Status: DC | PRN
  Administered 2020-02-23: 10 mL

## 2020-02-23 MED ORDER — MIDAZOLAM HCL 5 MG/5ML IJ SOLN
INTRAMUSCULAR | Status: DC | PRN
  Administered 2020-02-23: 2 mg via INTRAVENOUS

## 2020-02-23 MED ORDER — FENTANYL CITRATE (PF) 250 MCG/5ML IJ SOLN
INTRAMUSCULAR | Status: AC
  Filled 2020-02-23: qty 5

## 2020-02-23 MED ORDER — HYDROCODONE-ACETAMINOPHEN 5-325 MG PO TABS
1.0000 | ORAL_TABLET | Freq: Once | ORAL | Status: AC
  Administered 2020-02-23: 1 via ORAL

## 2020-02-23 MED ORDER — PROPOFOL 500 MG/50ML IV EMUL
INTRAVENOUS | Status: DC | PRN
  Administered 2020-02-23: 50 ug/kg/min via INTRAVENOUS

## 2020-02-23 MED ORDER — SODIUM CHLORIDE 0.9 % IV SOLN: INTRAVENOUS | Status: DC | PRN

## 2020-02-23 MED ORDER — HEPARIN SODIUM (PORCINE) 1000 UNIT/ML IJ SOLN
INTRAMUSCULAR | Status: AC
  Filled 2020-02-23: qty 1

## 2020-02-23 MED ORDER — SODIUM CHLORIDE 0.9 % IV SOLN
INTRAVENOUS | Status: AC
  Filled 2020-02-23: qty 1.2

## 2020-02-23 MED ORDER — ONDANSETRON HCL 4 MG/2ML IJ SOLN
INTRAMUSCULAR | Status: DC | PRN
  Administered 2020-02-23: 4 mg via INTRAVENOUS

## 2020-02-23 MED ORDER — HYDROCODONE-ACETAMINOPHEN 5-325 MG PO TABS
ORAL_TABLET | ORAL | Status: AC
  Filled 2020-02-23: qty 1

## 2020-02-23 MED ORDER — HYDROCODONE-ACETAMINOPHEN 5-325 MG PO TABS: 1.0000 | ORAL_TABLET | Freq: Four times a day (QID) | ORAL | 0 refills | Status: DC | PRN

## 2020-02-23 MED ORDER — METOPROLOL TARTRATE 50 MG PO TABS
ORAL_TABLET | ORAL | Status: AC
  Filled 2020-02-23: qty 2

## 2020-02-23 SURGICAL SUPPLY — 39 items
ADH SKN CLS APL DERMABOND .7 (GAUZE/BANDAGES/DRESSINGS) ×1
ARMBAND PINK RESTRICT EXTREMIT (MISCELLANEOUS) ×3 IMPLANT
CANISTER SUCT 3000ML PPV (MISCELLANEOUS) ×2 IMPLANT
CANNULA VESSEL 3MM 2 BLNT TIP (CANNULA) ×2 IMPLANT
CLIP VESOCCLUDE MED 6/CT (CLIP) ×2 IMPLANT
CLIP VESOCCLUDE SM WIDE 6/CT (CLIP) ×2 IMPLANT
COVER PROBE W GEL 5X96 (DRAPES) ×1 IMPLANT
COVER WAND RF STERILE (DRAPES) ×1 IMPLANT
DECANTER SPIKE VIAL GLASS SM (MISCELLANEOUS) ×2 IMPLANT
DERMABOND ADVANCED (GAUZE/BANDAGES/DRESSINGS) ×1
DERMABOND ADVANCED .7 DNX12 (GAUZE/BANDAGES/DRESSINGS) ×1 IMPLANT
DRAIN PENROSE 1/4X12 LTX STRL (WOUND CARE) ×2 IMPLANT
ELECT REM PT RETURN 9FT ADLT (ELECTROSURGICAL) ×2
ELECTRODE REM PT RTRN 9FT ADLT (ELECTROSURGICAL) ×1 IMPLANT
GLOVE BIO SURGEON STRL SZ7.5 (GLOVE) ×3 IMPLANT
GLOVE BIOGEL PI IND STRL 6.5 (GLOVE) IMPLANT
GLOVE BIOGEL PI IND STRL 8 (GLOVE) IMPLANT
GLOVE BIOGEL PI INDICATOR 6.5 (GLOVE) ×2
GLOVE BIOGEL PI INDICATOR 8 (GLOVE) ×2
GLOVE ECLIPSE 8.0 STRL XLNG CF (GLOVE) ×1 IMPLANT
GOWN STRL REIN XL XLG (GOWN DISPOSABLE) ×1 IMPLANT
GOWN STRL REUS W/ TWL LRG LVL3 (GOWN DISPOSABLE) ×3 IMPLANT
GOWN STRL REUS W/TWL 2XL LVL3 (GOWN DISPOSABLE) ×1 IMPLANT
GOWN STRL REUS W/TWL LRG LVL3 (GOWN DISPOSABLE) ×2
KIT BASIN OR (CUSTOM PROCEDURE TRAY) ×2 IMPLANT
KIT TURNOVER KIT B (KITS) ×2 IMPLANT
LOOP VESSEL MINI RED (MISCELLANEOUS) ×1 IMPLANT
NS IRRIG 1000ML POUR BTL (IV SOLUTION) ×2 IMPLANT
PACK CV ACCESS (CUSTOM PROCEDURE TRAY) ×2 IMPLANT
PAD ARMBOARD 7.5X6 YLW CONV (MISCELLANEOUS) ×4 IMPLANT
SPONGE SURGIFOAM ABS GEL 100 (HEMOSTASIS) IMPLANT
SUT PROLENE 6 0 CC (SUTURE) ×2 IMPLANT
SUT PROLENE 7 0 BV 1 (SUTURE) ×1 IMPLANT
SUT VIC AB 3-0 SH 27 (SUTURE) ×2
SUT VIC AB 3-0 SH 27X BRD (SUTURE) ×1 IMPLANT
SUT VICRYL 4-0 PS2 18IN ABS (SUTURE) ×2 IMPLANT
TOWEL GREEN STERILE (TOWEL DISPOSABLE) ×2 IMPLANT
UNDERPAD 30X30 (UNDERPADS AND DIAPERS) ×2 IMPLANT
WATER STERILE IRR 1000ML POUR (IV SOLUTION) ×2 IMPLANT

## 2020-02-23 NOTE — Op Note (Signed)
Procedure: Right Brachial Cephalic AV fistula  Preop: ESRD  Postop: ESRD  Anesthesia: Local with sedation  Assistant: Arlee Muslim PA-c  Findings: 3 mm cephalic vein  Procedure: After obtaining informed consent, the patient was taken to the operating room.  After adequate sedation, the right upper extremity was prepped and draped in usual sterile fashion. Ultrasound was used to identify the cephalic and basilic veins.  These were both marginal about 2-3 mm diameter. A transverse incision was then made near the antecubital crease the right arm. The incision was carried into the subcutaneous tissues down to level of the cephalic vein. The cephalic vein was approximately 3 mm in diameter. It was slightly sclerotic. This was dissected free circumferentially and small side branches ligated and divided between silk ties or clips. Next the brachial artery was dissected free in the medial portion of the incision. The artery was  3-4 mm in diameter. The vessel loops were placed proximal and distal to the planned site of arteriotomy. The patient was given 5000 units of intravenous heparin. After appropriate circulation time, the vessel loops were used to control the artery. A longitudinal opening was made in the brachial artery.  The vein was ligated distally with a 2-0 silk tie. The vein was controlled proximally with a fine bulldog clamp. The vein was then swung over to the artery and sewn end of vein to side of artery using a running 7-0 Prolene suture. Just prior to completion of the anastomosis, everything was fore bled back bled and thoroughly flushed. The anastomosis was secured, vessel loops released, and there was a palpable thrill in the fistula immediately. After hemostasis was obtained, the subcutaneous tissues were reapproximated using a running 3-0 Vicryl suture. The skin was then closed with a 4 0 Vicryl subcuticular stitch. Dermabond was applied to the skin incision.  The patient had a palpable  radial pulse at the end of the case.  Ruta Hinds, MD [facility] [place] Office: [phone] Pager: [phone]

## 2020-02-23 NOTE — Anesthesia Procedure Notes (Signed)
Procedure Name: MAC Date/Time: 02/23/2020 10:00 AM Performed by: Neldon Newport, CRNA Pre-anesthesia Checklist: Patient identified, Emergency Drugs available, Suction available, Patient being monitored and Timeout performed Patient Re-evaluated:Patient Re-evaluated prior to induction Oxygen Delivery Method: Simple face mask

## 2020-02-23 NOTE — Discharge Instructions (Signed)
° °  [facility] [place] ° °Discharge Instructions ° °AV Fistula or Graft Surgery for Dialysis Access ° °Please refer to the following instructions for your post-procedure care. Your surgeon or physician assistant will discuss any changes with you. ° °Activity ° °You may drive the day following your surgery, if you are comfortable and no longer taking prescription pain medication. Resume full activity as the soreness in your incision resolves. ° °Bathing/Showering ° °You may shower after you go home. Keep your incision dry for 48 hours. Do not soak in a bathtub, hot tub, or swim until the incision heals completely. You may not shower if you have a hemodialysis catheter. ° °Incision Care ° °Clean your incision with mild soap and water after 48 hours. Pat the area dry with a clean towel. You do not need a bandage unless otherwise instructed. Do not apply any ointments or creams to your incision. You may have skin glue on your incision. Do not peel it off. It will come off on its own in about one week. Your arm may swell a bit after surgery. To reduce swelling use pillows to elevate your arm so it is above your heart. Your doctor will tell you if you need to lightly wrap your arm with an ACE bandage. ° °Diet ° °Resume your normal diet. There are not special food restrictions following this procedure. In order to heal from your surgery, it is CRITICAL to get adequate nutrition. Your body requires vitamins, minerals, and protein. Vegetables are the best source of vitamins and minerals. Vegetables also provide the perfect balance of protein. Processed food has little nutritional value, so try to avoid this. ° °Medications ° °Resume taking all of your medications. If your incision is causing pain, you may take over-the counter pain relievers such as acetaminophen (Tylenol). If you were prescribed a stronger pain medication, please be aware these medications can cause nausea and constipation. Prevent  nausea by taking the medication with a snack or meal. Avoid constipation by drinking plenty of fluids and eating foods with high amount of fiber, such as fruits, vegetables, and grains. Do not take Tylenol if you are taking prescription pain medications. ° ° ° ° °Follow up °Your surgeon may want to see you in the office following your access surgery. If so, this will be arranged at the time of your surgery. ° °Please call us immediately for any of the following conditions: ° °Increased pain, redness, drainage (pus) from your incision site °Fever of 101 degrees or higher °Severe or worsening pain at your incision site °Hand pain or numbness. ° °Reduce your risk of vascular disease: ° °Stop smoking. If you would like help, call [facility] at 1-800-QUIT-NOW (1-800-784-8669) or [place] at 336-586-4000 ° °Manage your cholesterol °Maintain a desired weight °Control your diabetes °Keep your blood pressure down ° °Dialysis ° °It will take several weeks to several months for your new dialysis access to be ready for use. Your surgeon will determine when it is OK to use it. Your nephrologist will continue to direct your dialysis. You can continue to use your Permcath until your new access is ready for use. ° °If you have any questions, please call the office at [phone]. ° °

## 2020-02-23 NOTE — Anesthesia Preprocedure Evaluation (Addendum)
Anesthesia Evaluation  Patient identified by MRN, date of birth, ID band Patient awake    Reviewed: Allergy & Precautions, NPO status , Patient's Chart, lab work & pertinent test results, reviewed documented beta blocker date and time   History of Anesthesia Complications Negative for: history of anesthetic complications  Airway Mallampati: II  TM Distance: >3 FB Neck ROM: Full    Dental  (+) Missing, Dental Advisory Given   Pulmonary former smoker,  02/22/2020 SARS coronavirus NEG   breath sounds clear to auscultation       Cardiovascular hypertension, Pt. on medications and Pt. on home beta blockers (-) angina Rhythm:Regular Rate:Normal  '20 ECHO: EF 60-65%, valves OK   Neuro/Psych negative neurological ROS     GI/Hepatic negative GI ROS, Neg liver ROS,   Endo/Other  negative endocrine ROS  Renal/GU ESRF and DialysisRenal disease (MWF)     Musculoskeletal  (+) Arthritis ,   Abdominal   Peds  Hematology negative hematology ROS (+)   Anesthesia Other Findings   Reproductive/Obstetrics                            Anesthesia Physical Anesthesia Plan  ASA: III  Anesthesia Plan: MAC   Post-op Pain Management:    Induction:   PONV Risk Score and Plan: 1 and Ondansetron  Airway Management Planned: Natural Airway and Simple Face Mask  Additional Equipment:   Intra-op Plan:   Post-operative Plan:   Informed Consent: I have reviewed the patients History and Physical, chart, labs and discussed the procedure including the risks, benefits and alternatives for the proposed anesthesia with the patient or authorized representative who has indicated his/her understanding and acceptance.     Dental advisory given  Plan Discussed with: CRNA and Surgeon  Anesthesia Plan Comments:        Anesthesia Quick Evaluation

## 2020-02-23 NOTE — Transfer of Care (Signed)
Immediate Anesthesia Transfer of Care Note  Patient: Jesus Ayala  Procedure(s) Performed: RIGHT ARM ARTERIOVENOUS (AV) FISTULA CREATION (Right )  Patient Location: PACU  Anesthesia Type:MAC  Level of Consciousness: awake, alert  and oriented  Airway & Oxygen Therapy: Patient Spontanous Breathing and Patient connected to nasal cannula oxygen  Post-op Assessment: Report given to RN, Post -op Vital signs reviewed and stable and Patient moving all extremities X 4  Post vital signs: Reviewed and stable  Last Vitals:  Vitals Value Taken Time  BP 112/72 02/23/20 1123  Temp    Pulse 38 02/23/20 1123  Resp 11 02/23/20 1124  SpO2 87 % 02/23/20 1123  Vitals shown include unvalidated device data.  Last Pain:  Vitals:   02/23/20 0825  TempSrc:   PainSc: 0-No pain      Patients Stated Pain Goal: 3 (00/34/91 7915)  Complications: No apparent anesthesia complications

## 2020-02-23 NOTE — Progress Notes (Signed)
Unable to obtain an IV site in left arm after multiple attempts by Tammy, RN and Lindsi, RN.  Charge CRNA unavailable to help due to an emergency.  Called Dr. Jenita Seashore, informed her of our situation.  Per Dr. Glennon Mac, okay to use dialysis cath for IV site.  John, CRNA will access site, several flushes in room, need to draw an I-Stat 8 prior to going into surgery.

## 2020-02-23 NOTE — Anesthesia Postprocedure Evaluation (Signed)
Anesthesia Post Note  Patient: Jesus Ayala  Procedure(s) Performed: RIGHT ARM ARTERIOVENOUS (AV) FISTULA CREATION (Right )     Patient location during evaluation: PACU Anesthesia Type: MAC Level of consciousness: awake and alert, oriented and patient cooperative Pain management: pain level controlled Vital Signs Assessment: post-procedure vital signs reviewed and stable Respiratory status: spontaneous breathing, nonlabored ventilation and respiratory function stable Cardiovascular status: blood pressure returned to baseline and stable Postop Assessment: no apparent nausea or vomiting Anesthetic complications: no    Last Vitals:  Vitals:   02/23/20 1125 02/23/20 1140  BP: 112/72 101/78  Pulse: 81 78  Resp: 15 (!) 21  Temp: 36.5 C 36.7 C  SpO2: 100% 100%    Last Pain:  Vitals:   02/23/20 1141  TempSrc:   PainSc: 5                  Dray Dente,E. Lavell Ridings

## 2020-02-23 NOTE — H&P (Signed)
HPI:  Jesus Ayala is a 69 y.o. male, furred for placement of long-term hemodialysis access. The patient previously had a left forearm AV graft created by Dr. Scot Dock September 2020. This is now occluded. His hemodialysis days Monday Wednesday Friday. Other medical problems include hypertension hyperlipidemia both of which are currently stable.      Past Medical History:  Diagnosis Date  . Anemia   . Anxiety    situational  . ESRD (end stage renal disease) ([place])    MWF- [facility]  . HLD (hyperlipidemia)   . Hypertension         Past Surgical History:  Procedure Laterality Date  . AV FISTULA PLACEMENT Left 07/14/2019   Procedure: INSERTION OF ARTERIOVENOUS (AV) GORE-TEX GRAFT ARM; Surgeon: Angelia Mould, MD; Location: [facility]; Service: Vascular; Laterality: Left;  . COLONOSCOPY    . HERNIA REPAIR     unbicial  . INSERTION OF DIALYSIS CATHETER Right 06/25/2019   Procedure: INSERTION OF TUNNEL DIALYSIS CATHETER; Surgeon: Waynetta Sandy, MD; Location: [facility]; Service: Vascular; Laterality: Right;  . IR THROMBECTOMY AV FISTULA W/THROMBOLYSIS/PTA INC/SHUNT/IMG LEFT Left 09/14/2019  . IR US GUIDE VASC ACCESS LEFT  09/14/2019  . TEE WITHOUT CARDIOVERSION N/A 08/31/2019   Procedure: TRANSESOPHAGEAL ECHOCARDIOGRAM (TEE); Surgeon: Pixie Casino, MD; Location: [facility] ENDOSCOPY; Service: Cardiovascular; Laterality: N/A;        Family History  Problem Relation Age of Onset  . Lung cancer Father   . Hypertension Brother    SOCIAL HISTORY:  Social History        Socioeconomic History  . Marital status: Married    Spouse name: Not on file  . Number of children: Not on file  . Years of education: Not on file  . Highest education level: Not on file  Occupational History  . Not on file  Tobacco Use  . Smoking status: Former Smoker    Years: 2.00    Quit date: 1987    Years since quitting: 34.2  . Smokeless tobacco: Never Used  Substance and Sexual Activity   . Alcohol use: Not Currently  . Drug use: No  . Sexual activity: Not on file  Other Topics Concern  . Not on file  Social History Narrative  . Not on file   Social Determinants of Health      Financial Resource Strain:   . Difficulty of Paying Living Expenses:   Food Insecurity:   . Worried About Charity fundraiser in the Last Year:   . Arboriculturist in the Last Year:   Transportation Needs:   . Film/video editor (Medical):   [name] Lack of Transportation (Non-Medical):   Physical Activity:   . Days of Exercise per Week:   . Minutes of Exercise per Session:   Stress:   . Feeling of Stress :   Social Connections:   . Frequency of Communication with Friends and Family:   . Frequency of Social Gatherings with Friends and Family:   . Attends Religious Services:   . Active Member of Clubs or Organizations:   . Attends Archivist Meetings:   [name] Marital Status:   Intimate Partner Violence:   . Fear of Current or Ex-Partner:   . Emotionally Abused:   [name] Physically Abused:   . Sexually Abused:         Allergies  Allergen Reactions  . Ace Inhibitors Swelling and Other (See Comments)  . Enalapril Swelling    ANGIOEDEMA  of lips  . Iron Sucrose Itching  . Venofer [Ferric Oxide] Itching         Current Outpatient Medications  Medication Sig Dispense Refill  . acetaminophen (TYLENOL) 500 MG tablet Take 500-1,000 mg by mouth every 6 (six) hours as needed (pain).    [name] amLODipine (NORVASC) 10 MG tablet Take 10 mg by mouth daily.    [name] aspirin 81 MG chewable tablet Chew 81 mg by mouth daily.    . B Complex-C-Zn-Folic Acid (DIALYVITE 948-NIOE 15 PO) Take 1 tablet by mouth daily at 3 pm.    . calcitRIOL (ROCALTROL) 0.25 MCG capsule Take 0.25 mcg by mouth daily.    . calcium acetate (PHOSLO) 667 MG capsule Take 1 capsule (667 mg total) by mouth 3 (three) times daily with meals. (Patient taking differently: Take 1,334 mg by mouth 3 (three) times daily with meals. ) 90  capsule 0  . fluticasone (FLONASE) 50 MCG/ACT nasal spray 1 spray by Each Nare route 2 times daily.    . furosemide (LASIX) 80 MG tablet Take 80 mg by mouth daily.    [name] gabapentin (NEURONTIN) 300 MG capsule Take 300 mg by mouth 2 (two) times daily.    . hydrALAZINE (APRESOLINE) 25 MG tablet Take 1 tablet (25 mg total) by mouth 3 (three) times daily. 90 tablet 0  . hydrocortisone (ANUSOL-HC) 2.5 % rectal cream Place 1 application rectally 2 (two) times daily. 30 g 0  . latanoprost (XALATAN) 0.005 % ophthalmic solution Place 1 drop into both eyes at bedtime.    . Methoxy PEG-Epoetin Beta (MIRCERA IJ) Mircera    . metoprolol tartrate (LOPRESSOR) 100 MG tablet Take 100 mg by mouth 2 (two) times a day.    . nitroGLYCERIN (NITROSTAT) 0.4 MG SL tablet Place 0.4 mg under the tongue every 5 (five) minutes as needed for chest pain.     [name] temazepam (RESTORIL) 7.5 MG capsule Take by mouth.    . traZODone (DESYREL) 50 MG tablet Take 50 mg by mouth at bedtime.     No current facility-administered medications for this visit.   ROS:  General: No weight loss, Fever, chills  HEENT: No recent headaches, no nasal bleeding, no visual changes, no sore throat  Neurologic: No dizziness, blackouts, seizures. No recent symptoms of stroke or mini- stroke. No recent episodes of slurred speech, or temporary blindness.  Cardiac: No recent episodes of chest pain/pressure, no shortness of breath at rest. No shortness of breath with exertion. Denies history of atrial fibrillation or irregular heartbeat  Vascular: No history of rest pain in feet. No history of claudication. No history of non-healing ulcer, No history of DVT  Pulmonary: No home oxygen, no productive cough, no hemoptysis, No asthma or wheezing  Musculoskeletal: [ ]  Arthritis, [ ]  Low back pain, [ ]  Joint pain  Hematologic:No history of hypercoagulable state. No history of easy bleeding. No history of anemia  Gastrointestinal: No hematochezia or melena, No  gastroesophageal reflux, no trouble swallowing  Urinary: [X]  chronic Kidney disease, [X]  on HD - [X]  MWF or [ ]  TTHS, [ ]  Burning with urination, [ ]  Frequent urination, [ ]  Difficulty urinating;  Skin: No rashes  Psychological: No history of anxiety, No history of depression    Physical Examination   Vitals:   02/23/20 0758  BP: 108/70  Pulse: 68  Resp: 18  Temp: 97.7 F (36.5 C)  TempSrc: Oral  SpO2: 99%  Weight: 78.9 kg  Height: 5\' 11"  (1.803 m)  Body mass index is 24.41 kg/m.  General: Alert and oriented, no acute distress  HEENT: Normal  Neck: No JVD  Cardiac: Regular Rate and Rhythm  Skin: No rash  Extremity Pulses: 2+ radial, brachial pulses bilaterally thrombosed left forearm graft  Musculoskeletal: No deformity or edema  Neurologic: Upper and lower extremity motor 5/5 and symmetric    DATA:  Marginal veins bilaterally on my evaluation with the SonoSite at the bedside. Formal vein mapping today showed right cephalic and basilic vein were 3 to 4 mm over most of the course but with possible areas of narrowing. Left basilic vein was 3 mm.    ASSESSMENT: Patient needs new long-term hemodialysis access. He is reluctant to proceed with another AV graft. His veins are fairly marginal my exam but Vein mapping suggested that the right cephalic vein may be 3 mm. Right basilic vein was 4 mm. I think that the best option at this point would be to try to place a right arm AV fistula. He knows that this may have a 15 to 20% chance that it may not mature but he would prefer this to an AV graft. He currently is dialyzing via catheter.    PLAN: Right arm AV fistula cephalic versus basilic vein February 09, 4539.    Ruta Hinds, MD  [facility]  Office: [phone]  Pager: [phone]

## 2020-03-09 DIAGNOSIS — N2581 Secondary hyperparathyroidism of renal origin: Secondary | ICD-10-CM | POA: Diagnosis not present

## 2020-03-09 DIAGNOSIS — E876 Hypokalemia: Secondary | ICD-10-CM | POA: Diagnosis not present

## 2020-03-09 DIAGNOSIS — D689 Coagulation defect, unspecified: Secondary | ICD-10-CM | POA: Diagnosis not present

## 2020-03-09 DIAGNOSIS — L299 Pruritus, unspecified: Secondary | ICD-10-CM | POA: Diagnosis not present

## 2020-03-09 DIAGNOSIS — N186 End stage renal disease: Secondary | ICD-10-CM | POA: Diagnosis not present

## 2020-03-09 DIAGNOSIS — Z992 Dependence on renal dialysis: Secondary | ICD-10-CM | POA: Diagnosis not present

## 2020-03-09 DIAGNOSIS — T8249XD Other complication of vascular dialysis catheter, subsequent encounter: Secondary | ICD-10-CM | POA: Diagnosis not present

## 2020-03-09 DIAGNOSIS — D631 Anemia in chronic kidney disease: Secondary | ICD-10-CM | POA: Diagnosis not present

## 2020-03-21 DIAGNOSIS — N186 End stage renal disease: Secondary | ICD-10-CM | POA: Diagnosis not present

## 2020-03-21 DIAGNOSIS — I129 Hypertensive chronic kidney disease with stage 1 through stage 4 chronic kidney disease, or unspecified chronic kidney disease: Secondary | ICD-10-CM | POA: Diagnosis not present

## 2020-03-21 DIAGNOSIS — Z992 Dependence on renal dialysis: Secondary | ICD-10-CM | POA: Diagnosis not present

## 2020-03-23 DIAGNOSIS — N2581 Secondary hyperparathyroidism of renal origin: Secondary | ICD-10-CM | POA: Diagnosis not present

## 2020-03-23 DIAGNOSIS — T8249XD Other complication of vascular dialysis catheter, subsequent encounter: Secondary | ICD-10-CM | POA: Diagnosis not present

## 2020-03-23 DIAGNOSIS — N186 End stage renal disease: Secondary | ICD-10-CM | POA: Diagnosis not present

## 2020-03-23 DIAGNOSIS — E876 Hypokalemia: Secondary | ICD-10-CM | POA: Diagnosis not present

## 2020-03-23 DIAGNOSIS — D689 Coagulation defect, unspecified: Secondary | ICD-10-CM | POA: Diagnosis not present

## 2020-03-23 DIAGNOSIS — Z992 Dependence on renal dialysis: Secondary | ICD-10-CM | POA: Diagnosis not present

## 2020-03-28 DIAGNOSIS — R079 Chest pain, unspecified: Secondary | ICD-10-CM | POA: Diagnosis not present

## 2020-03-28 DIAGNOSIS — L0291 Cutaneous abscess, unspecified: Secondary | ICD-10-CM | POA: Diagnosis not present

## 2020-03-31 DIAGNOSIS — G5711 Meralgia paresthetica, right lower limb: Secondary | ICD-10-CM | POA: Diagnosis not present

## 2020-04-04 ENCOUNTER — Other Ambulatory Visit: Payer: Self-pay | Admitting: *Deleted

## 2020-04-04 DIAGNOSIS — G63 Polyneuropathy in diseases classified elsewhere: Secondary | ICD-10-CM | POA: Insufficient documentation

## 2020-04-04 DIAGNOSIS — N186 End stage renal disease: Secondary | ICD-10-CM

## 2020-04-05 DIAGNOSIS — H2513 Age-related nuclear cataract, bilateral: Secondary | ICD-10-CM | POA: Diagnosis not present

## 2020-04-05 DIAGNOSIS — H463 Toxic optic neuropathy: Secondary | ICD-10-CM | POA: Diagnosis not present

## 2020-04-07 ENCOUNTER — Other Ambulatory Visit: Payer: Self-pay

## 2020-04-07 ENCOUNTER — Ambulatory Visit (HOSPITAL_COMMUNITY)
Admission: RE | Admit: 2020-04-07 | Discharge: 2020-04-07 | Disposition: A | Discharge status: Home / Self Care | Payer: Medicare HMO | Source: Ambulatory Visit | Attending: Surgery | Admitting: Surgery

## 2020-04-07 ENCOUNTER — Ambulatory Visit (INDEPENDENT_AMBULATORY_CARE_PROVIDER_SITE_OTHER): Payer: Self-pay | Admitting: Physician Assistant

## 2020-04-07 VITALS — BP 135/76 | HR 81 | Temp 98.4°F | Resp 20 | Ht 71.0 in | Wt 179.5 lb

## 2020-04-07 DIAGNOSIS — Z992 Dependence on renal dialysis: Secondary | ICD-10-CM

## 2020-04-07 DIAGNOSIS — N186 End stage renal disease: Secondary | ICD-10-CM

## 2020-04-07 NOTE — Progress Notes (Signed)
POST OPERATIVE OFFICE NOTE    CC:  F/u for surgery; 6 weeks post-op  HPI:  This is a 69 y.o. male who is s/p right brachiocephalic arteriovenous fistula by Dr. Oneida Alar on Feb 23, 2020. He denies hand pain.  He is dialyzing via right IJ TDC without complications.  He notes right anterior thigh pain for which his PCP has prescribed gabapentin. He denies claudication or rest pain.   He is a non-smoker and is not diabetic.  Previous left forearm AVG which occluded.  Dialysis on MWF at [facility] clinic  Allergies  Allergen Reactions  . Ace Inhibitors Swelling and Other (See Comments)  . Enalapril Swelling    ANGIOEDEMA of lips  . Iron Sucrose Itching  . Venofer  [Ferric Oxide] Itching    Current Outpatient Medications  Medication Sig Dispense Refill  . acetaminophen (TYLENOL) 500 MG tablet Take 500-1,000 mg by mouth every 6 (six) hours as needed (pain).    [name] amLODipine (NORVASC) 10 MG tablet Take 5 mg by mouth daily.     [name] aspirin 81 MG chewable tablet Chew 81 mg by mouth daily.    . B Complex-C-Zn-Folic Acid (DIALYVITE 626-RSWN 15 PO) Take 1 tablet by mouth daily at 3 pm.    . calcium acetate (PHOSLO) 667 MG capsule Take 1 capsule (667 mg total) by mouth 3 (three) times daily with meals. (Patient taking differently: Take 1,334 mg by mouth 3 (three) times daily with meals. ) 90 capsule 0  . fluticasone (FLONASE) 50 MCG/ACT nasal spray Place 1 spray into both nostrils in the morning and at bedtime.     . furosemide (LASIX) 80 MG tablet Take 80 mg by mouth daily.    [name] gabapentin (NEURONTIN) 300 MG capsule Take 300 mg by mouth 2 (two) times daily.    . hydrALAZINE (APRESOLINE) 25 MG tablet Take 1 tablet (25 mg total) by mouth 3 (three) times daily. 90 tablet 0  . HYDROcodone-acetaminophen (NORCO) 5-325 MG tablet Take 1 tablet by mouth every 6 (six) hours as needed for moderate pain. 10 tablet 0  . latanoprost (XALATAN) 0.005 % ophthalmic solution Place 1 drop into both eyes at bedtime.    .  Methoxy PEG-Epoetin Beta (MIRCERA IJ) Mircera    . metoprolol tartrate (LOPRESSOR) 100 MG tablet Take 100 mg by mouth 2 (two) times a day.    . nitroGLYCERIN (NITROSTAT) 0.4 MG SL tablet Place 0.4 mg under the tongue every 5 (five) minutes as needed for chest pain.     [name] temazepam (RESTORIL) 7.5 MG capsule Take 7.5 mg by mouth at bedtime.     [name] zolpidem (AMBIEN) 5 MG tablet Take 5 mg by mouth at bedtime.     No current facility-administered medications for this visit.     ROS:  See HPI  Vitals:   04/07/20 1023  BP: 135/76  Pulse: 81  Resp: 20  Temp: 98.4 F (36.9 C)  SpO2: 97%   Physical Exam:  General appearance: WD, WN male in NAD Cardiac: RRR Respiratory:non-labored Incision:  Right AC well healed Extremities:  palpable fistula along right upper arm with good thrill and bruit. 2+ radial pulse. 5/5 grip strength. 2+ right DP and PT pulses Neuro: A and O times 4    Findings:  +--------------------+----------+-----------------+-------------------+  AVF         PSV (cm/s)Flow Vol (mL/min)   Comments     +--------------------+----------+-----------------+-------------------+  Native artery inflow  113      457                +--------------------+----------+-----------------+-------------------+  AVF Anastomosis     339           velocity ratio of 3  +--------------------+----------+-----------------+-------------------+     +------------+----------+-------------+----------+-----------------+  OUTFLOW VEINPSV (cm/s)Diameter (cm)Depth (cm)  Describe     +------------+----------+-------------+----------+-----------------+  Prox UA     127    0.43     0.27   branch 0.27 cm   +------------+----------+-------------+----------+-----------------+  Mid UA     153    0.33     0.19             +------------+----------+-------------+----------+-----------------+  Dist  UA     95    0.46     0.15   branch 0.27 cm   +------------+----------+-------------+----------+-----------------+  AC Fossa    197    0.48     0.18  slightly tortuous  +------------+----------+-------------+----------+-----------------+    Assessment/Plan:  This is a 69 y.o. male who is s/p right upper extremity brachiocephalic AVF 6 weeks ago.  He is not experiencing upper extremity pain.  His fistula is easily palpable, but not sufficiently matured to cannulate.  Continue right upper extremity exercise and re-evaluate in 6 weeks with repeat duplex.  Risa Grill, PA-C [facility] [phone]  Clinic MD: Scot Dock

## 2020-04-08 ENCOUNTER — Other Ambulatory Visit: Payer: Self-pay | Admitting: *Deleted

## 2020-04-08 DIAGNOSIS — N186 End stage renal disease: Secondary | ICD-10-CM

## 2020-04-20 DIAGNOSIS — N186 End stage renal disease: Secondary | ICD-10-CM | POA: Diagnosis not present

## 2020-04-20 DIAGNOSIS — I129 Hypertensive chronic kidney disease with stage 1 through stage 4 chronic kidney disease, or unspecified chronic kidney disease: Secondary | ICD-10-CM | POA: Diagnosis not present

## 2020-04-20 DIAGNOSIS — Z992 Dependence on renal dialysis: Secondary | ICD-10-CM | POA: Diagnosis not present

## 2020-04-22 DIAGNOSIS — Z992 Dependence on renal dialysis: Secondary | ICD-10-CM | POA: Diagnosis not present

## 2020-04-22 DIAGNOSIS — N2581 Secondary hyperparathyroidism of renal origin: Secondary | ICD-10-CM | POA: Diagnosis not present

## 2020-04-22 DIAGNOSIS — T8249XD Other complication of vascular dialysis catheter, subsequent encounter: Secondary | ICD-10-CM | POA: Diagnosis not present

## 2020-04-22 DIAGNOSIS — D631 Anemia in chronic kidney disease: Secondary | ICD-10-CM | POA: Diagnosis not present

## 2020-04-22 DIAGNOSIS — E876 Hypokalemia: Secondary | ICD-10-CM | POA: Diagnosis not present

## 2020-04-22 DIAGNOSIS — N186 End stage renal disease: Secondary | ICD-10-CM | POA: Diagnosis not present

## 2020-04-22 DIAGNOSIS — D689 Coagulation defect, unspecified: Secondary | ICD-10-CM | POA: Diagnosis not present

## 2020-04-28 DIAGNOSIS — I120 Hypertensive chronic kidney disease with stage 5 chronic kidney disease or end stage renal disease: Secondary | ICD-10-CM | POA: Diagnosis not present

## 2020-04-28 DIAGNOSIS — S20401D Unspecified superficial injuries of right back wall of thorax, subsequent encounter: Secondary | ICD-10-CM | POA: Diagnosis not present

## 2020-04-28 DIAGNOSIS — N186 End stage renal disease: Secondary | ICD-10-CM | POA: Diagnosis not present

## 2020-04-28 DIAGNOSIS — D631 Anemia in chronic kidney disease: Secondary | ICD-10-CM | POA: Diagnosis not present

## 2020-05-03 DIAGNOSIS — K4090 Unilateral inguinal hernia, without obstruction or gangrene, not specified as recurrent: Secondary | ICD-10-CM | POA: Diagnosis not present

## 2020-05-03 DIAGNOSIS — Z992 Dependence on renal dialysis: Secondary | ICD-10-CM | POA: Diagnosis not present

## 2020-05-03 DIAGNOSIS — N186 End stage renal disease: Secondary | ICD-10-CM | POA: Diagnosis not present

## 2020-05-17 ENCOUNTER — Encounter (HOSPITAL_COMMUNITY): Payer: Medicare HMO

## 2020-05-19 ENCOUNTER — Other Ambulatory Visit: Payer: Self-pay

## 2020-05-19 ENCOUNTER — Ambulatory Visit (INDEPENDENT_AMBULATORY_CARE_PROVIDER_SITE_OTHER): Payer: Self-pay | Admitting: Physician Assistant

## 2020-05-19 ENCOUNTER — Ambulatory Visit (HOSPITAL_COMMUNITY)
Admission: RE | Admit: 2020-05-19 | Discharge: 2020-05-19 | Disposition: A | Discharge status: Home / Self Care | Payer: Medicare HMO | Source: Ambulatory Visit | Attending: Surgery | Admitting: Surgery

## 2020-05-19 VITALS — BP 120/78 | HR 88 | Temp 97.7°F | Resp 20 | Ht 71.0 in | Wt 178.8 lb

## 2020-05-19 DIAGNOSIS — Z992 Dependence on renal dialysis: Secondary | ICD-10-CM | POA: Diagnosis not present

## 2020-05-19 DIAGNOSIS — N186 End stage renal disease: Secondary | ICD-10-CM

## 2020-05-19 NOTE — Progress Notes (Signed)
POST OPERATIVE OFFICE NOTE    CC:  F/u for surgery  HPI:  This is a 69 y.o. male who is s/p right BC AVF on 02/23/2020 by Dr. Oneida Alar.  Pt was last seen on 04/07/2020 and at that time, pt was doing well but although his fistula was easily palpable, it was not mature enough to cannulate and was advised to return in 6 weeks to re-evaluate.    The pt denies any pain in his hand.  His catheter is working well.    The pt is on dialysis via right IJ [facility] M/W/F at the [facility] location.  Allergies  Allergen Reactions  . Ace Inhibitors Swelling and Other (See Comments)  . Enalapril Swelling    ANGIOEDEMA of lips  . Iron Sucrose Itching  . Venofer  [Ferric Oxide] Itching    Current Outpatient Medications  Medication Sig Dispense Refill  . acetaminophen (TYLENOL) 500 MG tablet Take 500-1,000 mg by mouth every 6 (six) hours as needed (pain).    [name] aspirin 81 MG chewable tablet Chew 81 mg by mouth daily.    . B Complex-C-Zn-Folic Acid (DIALYVITE 063-KZSW 15 PO) Take 1 tablet by mouth daily at 3 pm.    . calcium acetate (PHOSLO) 667 MG capsule Take 1 capsule (667 mg total) by mouth 3 (three) times daily with meals. (Patient taking differently: Take 1,334 mg by mouth 3 (three) times daily with meals. ) 90 capsule 0  . doxercalciferol (HECTOROL) 0.5 MCG capsule Doxercalciferol (Hectorol)    . fluticasone (FLONASE) 50 MCG/ACT nasal spray Place 1 spray into both nostrils in the morning and at bedtime.     . furosemide (LASIX) 80 MG tablet Take 80 mg by mouth daily.    [name] gabapentin (NEURONTIN) 300 MG capsule Take 300 mg by mouth 2 (two) times daily.    . hydrALAZINE (APRESOLINE) 25 MG tablet Take 1 tablet (25 mg total) by mouth 3 (three) times daily. 90 tablet 0  . HYDROcodone-acetaminophen (NORCO) 5-325 MG tablet Take 1 tablet by mouth every 6 (six) hours as needed for moderate pain. 10 tablet 0  . latanoprost (XALATAN) 0.005 % ophthalmic solution Place 1 drop into both eyes at bedtime.    .  Methoxy PEG-Epoetin Beta (MIRCERA IJ) Mircera    . nitroGLYCERIN (NITROSTAT) 0.4 MG SL tablet Place 0.4 mg under the tongue every 5 (five) minutes as needed for chest pain.     [name] temazepam (RESTORIL) 7.5 MG capsule Take 7.5 mg by mouth at bedtime.     . traZODone (DESYREL) 100 MG tablet Take 100 mg by mouth at bedtime.    [name] zolpidem (AMBIEN) 5 MG tablet Take 5 mg by mouth at bedtime.     No current facility-administered medications for this visit.     ROS:  See HPI  Physical Exam:  Today's Vitals   05/19/20 1024  BP: 120/78  Pulse: 88  Resp: 20  Temp: 97.7 F (36.5 C)  TempSrc: Temporal  SpO2: 96%  Weight: 178 lb 12.8 oz (81.1 kg)  Height: 5\' 11"  (1.803 m)   Body mass index is 24.94 kg/m.   Incision:  Healed nicely Extremities:  There is a palpable right radial pulse.  Motor and sensory are in tact.  There is a thrill/bruit present. Above the anastomosis about mid arm, the fistula is pulsatile and this lessens in the more proximal portion of the fistula.   Dialysis Duplex on 05/19/2020: Diameter:  0.34cm-0.39cm Depth:  0.21cm-0.74cm Patent AVF.  Abnormal waveform in the inflow artery with diminished velocity.  Multiple areas of narrowing in the outflow vein with stenosis in the  prox-mid upper arm    Assessment/Plan:  This is a 69 y.o. male who is s/p:  right BC AVF on 02/23/2020 by Dr. Oneida Alar  -the pt does not have evidence of steal. -the fistula continues to be slow to mature.  It is pulsatile in the mid portion of the fistula.  Discussed undergoing fistulogram with pt to help with maturation.  I did discuss that even with intervention, there is a chance it won't mature and may need new access.  Given there are no other options in the left arm, will attempt to help this right BC fistula mature.  -will proceed with fistulogram on non-dialysis day (Tues or Thurs).  -he is currently being worked up for kidney transplant at [facility].   Leontine Locket, [facility] [phone]  Clinic MD:  Oneida Alar

## 2020-05-19 NOTE — H&P (View-Only) (Signed)
POST OPERATIVE OFFICE NOTE    CC:  F/u for surgery  HPI:  This is a 69 y.o. male who is s/p right BC AVF on 02/23/2020 by Dr. Oneida Alar.  Pt was last seen on 04/07/2020 and at that time, pt was doing well but although his fistula was easily palpable, it was not mature enough to cannulate and was advised to return in 6 weeks to re-evaluate.    The pt denies any pain in his hand.  His catheter is working well.    The pt is on dialysis via right IJ [facility] M/W/F at the [facility] location.  Allergies  Allergen Reactions  . Ace Inhibitors Swelling and Other (See Comments)  . Enalapril Swelling    ANGIOEDEMA of lips  . Iron Sucrose Itching  . Venofer  [Ferric Oxide] Itching    Current Outpatient Medications  Medication Sig Dispense Refill  . acetaminophen (TYLENOL) 500 MG tablet Take 500-1,000 mg by mouth every 6 (six) hours as needed (pain).    [name] aspirin 81 MG chewable tablet Chew 81 mg by mouth daily.    . B Complex-C-Zn-Folic Acid (DIALYVITE 825-KNLZ 15 PO) Take 1 tablet by mouth daily at 3 pm.    . calcium acetate (PHOSLO) 667 MG capsule Take 1 capsule (667 mg total) by mouth 3 (three) times daily with meals. (Patient taking differently: Take 1,334 mg by mouth 3 (three) times daily with meals. ) 90 capsule 0  . doxercalciferol (HECTOROL) 0.5 MCG capsule Doxercalciferol (Hectorol)    . fluticasone (FLONASE) 50 MCG/ACT nasal spray Place 1 spray into both nostrils in the morning and at bedtime.     . furosemide (LASIX) 80 MG tablet Take 80 mg by mouth daily.    [name] gabapentin (NEURONTIN) 300 MG capsule Take 300 mg by mouth 2 (two) times daily.    . hydrALAZINE (APRESOLINE) 25 MG tablet Take 1 tablet (25 mg total) by mouth 3 (three) times daily. 90 tablet 0  . HYDROcodone-acetaminophen (NORCO) 5-325 MG tablet Take 1 tablet by mouth every 6 (six) hours as needed for moderate pain. 10 tablet 0  . latanoprost (XALATAN) 0.005 % ophthalmic solution Place 1 drop into both eyes at bedtime.    .  Methoxy PEG-Epoetin Beta (MIRCERA IJ) Mircera    . nitroGLYCERIN (NITROSTAT) 0.4 MG SL tablet Place 0.4 mg under the tongue every 5 (five) minutes as needed for chest pain.     [name] temazepam (RESTORIL) 7.5 MG capsule Take 7.5 mg by mouth at bedtime.     . traZODone (DESYREL) 100 MG tablet Take 100 mg by mouth at bedtime.    [name] zolpidem (AMBIEN) 5 MG tablet Take 5 mg by mouth at bedtime.     No current facility-administered medications for this visit.     ROS:  See HPI  Physical Exam:  Today's Vitals   05/19/20 1024  BP: 120/78  Pulse: 88  Resp: 20  Temp: 97.7 F (36.5 C)  TempSrc: Temporal  SpO2: 96%  Weight: 178 lb 12.8 oz (81.1 kg)  Height: 5\' 11"  (1.803 m)   Body mass index is 24.94 kg/m.   Incision:  Healed nicely Extremities:  There is a palpable right radial pulse.  Motor and sensory are in tact.  There is a thrill/bruit present. Above the anastomosis about mid arm, the fistula is pulsatile and this lessens in the more proximal portion of the fistula.   Dialysis Duplex on 05/19/2020: Diameter:  0.34cm-0.39cm Depth:  0.21cm-0.74cm Patent AVF.  Abnormal waveform in the inflow artery with diminished velocity.  Multiple areas of narrowing in the outflow vein with stenosis in the  prox-mid upper arm    Assessment/Plan:  This is a 69 y.o. male who is s/p:  right BC AVF on 02/23/2020 by Dr. Oneida Alar  -the pt does not have evidence of steal. -the fistula continues to be slow to mature.  It is pulsatile in the mid portion of the fistula.  Discussed undergoing fistulogram with pt to help with maturation.  I did discuss that even with intervention, there is a chance it won't mature and may need new access.  Given there are no other options in the left arm, will attempt to help this right BC fistula mature.  -will proceed with fistulogram on non-dialysis day (Tues or Thurs).  -he is currently being worked up for kidney transplant at [facility].   Leontine Locket, [facility] [phone]  Clinic MD:  Oneida Alar

## 2020-05-21 DIAGNOSIS — I129 Hypertensive chronic kidney disease with stage 1 through stage 4 chronic kidney disease, or unspecified chronic kidney disease: Secondary | ICD-10-CM | POA: Diagnosis not present

## 2020-05-21 DIAGNOSIS — Z992 Dependence on renal dialysis: Secondary | ICD-10-CM | POA: Diagnosis not present

## 2020-05-21 DIAGNOSIS — N186 End stage renal disease: Secondary | ICD-10-CM | POA: Diagnosis not present

## 2020-05-23 DIAGNOSIS — T8249XD Other complication of vascular dialysis catheter, subsequent encounter: Secondary | ICD-10-CM | POA: Diagnosis not present

## 2020-05-23 DIAGNOSIS — D689 Coagulation defect, unspecified: Secondary | ICD-10-CM | POA: Diagnosis not present

## 2020-05-23 DIAGNOSIS — L299 Pruritus, unspecified: Secondary | ICD-10-CM | POA: Diagnosis not present

## 2020-05-23 DIAGNOSIS — N186 End stage renal disease: Secondary | ICD-10-CM | POA: Diagnosis not present

## 2020-05-23 DIAGNOSIS — D631 Anemia in chronic kidney disease: Secondary | ICD-10-CM | POA: Diagnosis not present

## 2020-05-23 DIAGNOSIS — Z992 Dependence on renal dialysis: Secondary | ICD-10-CM | POA: Diagnosis not present

## 2020-05-23 DIAGNOSIS — N2581 Secondary hyperparathyroidism of renal origin: Secondary | ICD-10-CM | POA: Diagnosis not present

## 2020-05-25 ENCOUNTER — Other Ambulatory Visit: Payer: Self-pay

## 2020-05-25 MED ORDER — SODIUM CHLORIDE 0.9 % IV SOLN
250.0000 mL | INTRAVENOUS | Status: AC | PRN
Start: 2020-05-25 — End: ?

## 2020-05-26 DIAGNOSIS — N189 Chronic kidney disease, unspecified: Secondary | ICD-10-CM | POA: Diagnosis not present

## 2020-05-26 DIAGNOSIS — G63 Polyneuropathy in diseases classified elsewhere: Secondary | ICD-10-CM | POA: Diagnosis not present

## 2020-05-26 DIAGNOSIS — G5711 Meralgia paresthetica, right lower limb: Secondary | ICD-10-CM | POA: Insufficient documentation

## 2020-05-26 DIAGNOSIS — Z8679 Personal history of other diseases of the circulatory system: Secondary | ICD-10-CM | POA: Insufficient documentation

## 2020-05-26 DIAGNOSIS — R69 Illness, unspecified: Secondary | ICD-10-CM | POA: Diagnosis not present

## 2020-05-26 DIAGNOSIS — J029 Acute pharyngitis, unspecified: Secondary | ICD-10-CM | POA: Diagnosis not present

## 2020-06-02 ENCOUNTER — Other Ambulatory Visit (HOSPITAL_COMMUNITY)
Admission: RE | Admit: 2020-06-02 | Discharge: 2020-06-02 | Disposition: A | Discharge status: Home / Self Care | Payer: Medicare HMO | Source: Ambulatory Visit | Attending: Vascular Surgery | Admitting: Vascular Surgery

## 2020-06-02 DIAGNOSIS — Z20822 Contact with and (suspected) exposure to covid-19: Secondary | ICD-10-CM | POA: Insufficient documentation

## 2020-06-02 DIAGNOSIS — Z01812 Encounter for preprocedural laboratory examination: Secondary | ICD-10-CM | POA: Diagnosis not present

## 2020-06-02 LAB — SARS CORONAVIRUS 2 (TAT 6-24 HRS): SARS Coronavirus 2: NEGATIVE

## 2020-06-03 ENCOUNTER — Encounter (HOSPITAL_COMMUNITY)
Admission: RE | Disposition: A | Discharge status: Home / Self Care | Payer: Self-pay | Source: Home / Self Care | Attending: Vascular Surgery

## 2020-06-03 ENCOUNTER — Ambulatory Visit (HOSPITAL_COMMUNITY)
Admission: RE | Admit: 2020-06-03 | Discharge: 2020-06-03 | Disposition: A | Discharge status: Home / Self Care | Payer: Medicare HMO | Attending: Vascular Surgery | Admitting: Vascular Surgery

## 2020-06-03 ENCOUNTER — Other Ambulatory Visit: Payer: Self-pay

## 2020-06-03 DIAGNOSIS — Z992 Dependence on renal dialysis: Secondary | ICD-10-CM | POA: Insufficient documentation

## 2020-06-03 DIAGNOSIS — N186 End stage renal disease: Secondary | ICD-10-CM | POA: Diagnosis not present

## 2020-06-03 DIAGNOSIS — Y841 Kidney dialysis as the cause of abnormal reaction of the patient, or of later complication, without mention of misadventure at the time of the procedure: Secondary | ICD-10-CM | POA: Diagnosis not present

## 2020-06-03 DIAGNOSIS — Z7982 Long term (current) use of aspirin: Secondary | ICD-10-CM | POA: Insufficient documentation

## 2020-06-03 DIAGNOSIS — T82510A Breakdown (mechanical) of surgically created arteriovenous fistula, initial encounter: Secondary | ICD-10-CM | POA: Insufficient documentation

## 2020-06-03 DIAGNOSIS — Z79899 Other long term (current) drug therapy: Secondary | ICD-10-CM | POA: Insufficient documentation

## 2020-06-03 DIAGNOSIS — T82898A Other specified complication of vascular prosthetic devices, implants and grafts, initial encounter: Secondary | ICD-10-CM | POA: Diagnosis not present

## 2020-06-03 HISTORY — PX: A/V FISTULAGRAM: CATH118298

## 2020-06-03 LAB — POCT I-STAT, CHEM 8
BUN: 38 mg/dL — ABNORMAL HIGH (ref 8–23)
Calcium, Ion: 1.11 mmol/L — ABNORMAL LOW (ref 1.15–1.40)
Chloride: 93 mmol/L — ABNORMAL LOW (ref 98–111)
Creatinine, Ser: 13.1 mg/dL — ABNORMAL HIGH (ref 0.61–1.24)
Glucose, Bld: 96 mg/dL (ref 70–99)
HCT: 38 % — ABNORMAL LOW (ref 39.0–52.0)
Hemoglobin: 12.9 g/dL — ABNORMAL LOW (ref 13.0–17.0)
Potassium: 4.5 mmol/L (ref 3.5–5.1)
Sodium: 132 mmol/L — ABNORMAL LOW (ref 135–145)
TCO2: 26 mmol/L (ref 22–32)

## 2020-06-03 SURGERY — A/V FISTULAGRAM
Anesthesia: LOCAL | Laterality: Right

## 2020-06-03 MED ORDER — HYDRALAZINE HCL 20 MG/ML IJ SOLN: 5.0000 mg | INTRAMUSCULAR | Status: DC | PRN

## 2020-06-03 MED ORDER — OXYCODONE HCL 5 MG PO TABS: 5.0000 mg | ORAL_TABLET | ORAL | Status: DC | PRN

## 2020-06-03 MED ORDER — IODIXANOL 320 MG/ML IV SOLN
INTRAVENOUS | Status: DC | PRN
  Administered 2020-06-03: 30 mL

## 2020-06-03 MED ORDER — LABETALOL HCL 5 MG/ML IV SOLN: 10.0000 mg | INTRAVENOUS | Status: DC | PRN

## 2020-06-03 MED ORDER — HEPARIN (PORCINE) IN NACL 1000-0.9 UT/500ML-% IV SOLN
INTRAVENOUS | Status: DC | PRN
  Administered 2020-06-03: 500 mL

## 2020-06-03 MED ORDER — SODIUM CHLORIDE 0.9% FLUSH: 3.0000 mL | INTRAVENOUS | Status: DC | PRN

## 2020-06-03 MED ORDER — SODIUM CHLORIDE 0.9 % IV SOLN: 250.0000 mL | INTRAVENOUS | Status: DC | PRN

## 2020-06-03 MED ORDER — HEPARIN (PORCINE) IN NACL 1000-0.9 UT/500ML-% IV SOLN
INTRAVENOUS | Status: AC
  Filled 2020-06-03: qty 500

## 2020-06-03 MED ORDER — ONDANSETRON HCL 4 MG/2ML IJ SOLN: 4.0000 mg | Freq: Four times a day (QID) | INTRAMUSCULAR | Status: DC | PRN

## 2020-06-03 MED ORDER — MORPHINE SULFATE (PF) 2 MG/ML IV SOLN: 2.0000 mg | INTRAVENOUS | Status: DC | PRN

## 2020-06-03 MED ORDER — LIDOCAINE HCL (PF) 1 % IJ SOLN
INTRAMUSCULAR | Status: DC | PRN
  Administered 2020-06-03: 2 mL via INTRADERMAL

## 2020-06-03 MED ORDER — SODIUM CHLORIDE 0.9% FLUSH: 3.0000 mL | Freq: Two times a day (BID) | INTRAVENOUS | Status: DC

## 2020-06-03 MED ORDER — LIDOCAINE HCL (PF) 1 % IJ SOLN
INTRAMUSCULAR | Status: AC
  Filled 2020-06-03: qty 30

## 2020-06-03 MED ORDER — ACETAMINOPHEN 325 MG PO TABS: 650.0000 mg | ORAL_TABLET | ORAL | Status: DC | PRN

## 2020-06-03 SURGICAL SUPPLY — 8 items
COVER DOME SNAP 22 D (MISCELLANEOUS) ×2 IMPLANT
KIT MICROPUNCTURE NIT STIFF (SHEATH) ×1 IMPLANT
PROTECTION STATION PRESSURIZED (MISCELLANEOUS) ×2
SHEATH PROBE COVER 6X72 (BAG) ×1 IMPLANT
STATION PROTECTION PRESSURIZED (MISCELLANEOUS) ×1 IMPLANT
STOPCOCK MORSE 400PSI 3WAY (MISCELLANEOUS) ×2 IMPLANT
TRAY PV CATH (CUSTOM PROCEDURE TRAY) ×2 IMPLANT
TUBING CIL FLEX 10 FLL-RA (TUBING) ×2 IMPLANT

## 2020-06-03 NOTE — Op Note (Signed)
Procedure: Right upper extremity fistulogram, left upper extremity central venogram  Preoperative diagnosis: End-stage renal disease with poorly functioning right arm AV fistula  Postoperative diagnosis: Same  Anesthesia: Local  Operative findings: 1.  Diffusely narrowed right upper arm AV fistula unamenable to percutaneous intervention  2 patent left central veins but multiple stents up to the left axilla and small outflow vein left axillary  Operative details: After team informed consent, the patient was taken the [facility] lab.  The patient was placed in supine position angio table.  Patient's right upper extremities prepped and draped in usual sterile fashion.  Local anesthesia was infiltrated over a pre-existing right brachiocephalic AV fistula.  Ultrasound was used to identify the proximal aspect of the fistula.  Local anesthesia was infiltrated in this area.  Micropuncture needle was used to cannulate the right upper arm AV fistula micropuncture wire advanced into this and the micropuncture sheath placed over this.  Contrast angiogram was then obtained with the micropuncture sheath.  Central veins on the right side are widely patent.  There is a diffusely small cephalic vein extending from the shoulder all the way down to the antecubital level.  There are multiple segments of narrowing within it.  Next a blood pressure cuff was placed on the upper arm and contrast Eliza reflux across the arterial anastomosis.  There is narrowing across the arterial anastomosis as well.  An additional contrast angiogram revealed that the basilic vein was small but the axillary vein was patent and of good quality about 6 to 7 mm in diameter.  At this point I decided that the diffuse narrowing of the AV fistula would make it nonsalvageable as we would have to balloon the entire vein which currently is only about 3 mm in diameter and extended up to 6 mm in diameter.  I did not think the vein would tolerate this.  Therefore  the micropuncture sheath was removed and a figure-of-eight Monocryl stitch placed.  We then turned attention to the left upper extremity to see if there were any options for an AV graft on that side since the patient would prefer to go on the left arm.  Contrast angiogram was performed through a peripheral IV in the left hand.  This shows the central veins on the left side are patent.  In the left upper extremity, there are multiple stents extending all the way up to the axilla which are all occluded.  There are paired veins in the axilla both of which are about 4 mm diameter.  Although patent and upper arm graft on the side would not be as good quality vein as on the right side.  At this point the procedure was concluded.  Findings were discussed with the patient.  Operative management: The patient will be scheduled in the near future for a right upper arm AV graft.  Ruta Hinds, MD [facility] [place] Office: [phone]

## 2020-06-03 NOTE — Discharge Instructions (Signed)

## 2020-06-03 NOTE — Interval H&P Note (Signed)
History and Physical Interval Note:  06/03/2020 8:49 AM  Jesus Ayala  has presented today for surgery, with the diagnosis of end stage renal.  The various methods of treatment have been discussed with the patient and family. After consideration of risks, benefits and other options for treatment, the patient has consented to  Procedure(s): A/V FISTULAGRAM (Right) as a surgical intervention.  The patient's history has been reviewed, patient examined, no change in status, stable for surgery.  I have reviewed the patient's chart and labs.  Questions were answered to the patient's satisfaction.     Ruta Hinds

## 2020-06-06 ENCOUNTER — Other Ambulatory Visit: Payer: Self-pay

## 2020-06-06 ENCOUNTER — Encounter (HOSPITAL_COMMUNITY): Payer: Self-pay | Admitting: Vascular Surgery

## 2020-06-06 DIAGNOSIS — N186 End stage renal disease: Secondary | ICD-10-CM | POA: Diagnosis not present

## 2020-06-06 DIAGNOSIS — J029 Acute pharyngitis, unspecified: Secondary | ICD-10-CM | POA: Diagnosis not present

## 2020-06-06 DIAGNOSIS — Z992 Dependence on renal dialysis: Secondary | ICD-10-CM | POA: Diagnosis not present

## 2020-06-07 ENCOUNTER — Emergency Department (HOSPITAL_COMMUNITY): Payer: Medicare HMO

## 2020-06-07 ENCOUNTER — Emergency Department (HOSPITAL_COMMUNITY)
Admission: EM | Admit: 2020-06-07 | Discharge: 2020-06-08 | Disposition: A | Discharge status: Home / Self Care | Payer: Medicare HMO | Attending: Emergency Medicine | Admitting: Emergency Medicine

## 2020-06-07 ENCOUNTER — Encounter (HOSPITAL_COMMUNITY): Payer: Self-pay | Admitting: Emergency Medicine

## 2020-06-07 DIAGNOSIS — I129 Hypertensive chronic kidney disease with stage 1 through stage 4 chronic kidney disease, or unspecified chronic kidney disease: Secondary | ICD-10-CM | POA: Insufficient documentation

## 2020-06-07 DIAGNOSIS — R0789 Other chest pain: Secondary | ICD-10-CM

## 2020-06-07 DIAGNOSIS — Z79899 Other long term (current) drug therapy: Secondary | ICD-10-CM | POA: Insufficient documentation

## 2020-06-07 DIAGNOSIS — R079 Chest pain, unspecified: Secondary | ICD-10-CM | POA: Diagnosis not present

## 2020-06-07 DIAGNOSIS — N186 End stage renal disease: Secondary | ICD-10-CM | POA: Diagnosis not present

## 2020-06-07 DIAGNOSIS — Z87891 Personal history of nicotine dependence: Secondary | ICD-10-CM | POA: Insufficient documentation

## 2020-06-07 DIAGNOSIS — N183 Chronic kidney disease, stage 3 unspecified: Secondary | ICD-10-CM | POA: Diagnosis not present

## 2020-06-07 DIAGNOSIS — I10 Essential (primary) hypertension: Secondary | ICD-10-CM | POA: Diagnosis not present

## 2020-06-07 LAB — BASIC METABOLIC PANEL
Anion gap: 13 (ref 5–15)
BUN: 24 mg/dL — ABNORMAL HIGH (ref 8–23)
CO2: 26 mmol/L (ref 22–32)
Calcium: 9.1 mg/dL (ref 8.9–10.3)
Chloride: 93 mmol/L — ABNORMAL LOW (ref 98–111)
Creatinine, Ser: 10.37 mg/dL — ABNORMAL HIGH (ref 0.61–1.24)
GFR calc Af Amer: 5 mL/min — ABNORMAL LOW (ref >60)
GFR calc non Af Amer: 5 mL/min — ABNORMAL LOW (ref >60)
Glucose, Bld: 111 mg/dL — ABNORMAL HIGH (ref 70–99)
Potassium: 4.2 mmol/L (ref 3.5–5.1)
Sodium: 132 mmol/L — ABNORMAL LOW (ref 135–145)

## 2020-06-07 LAB — CBC
HCT: 31.4 % — ABNORMAL LOW (ref 39.0–52.0)
Hemoglobin: 10.2 g/dL — ABNORMAL LOW (ref 13.0–17.0)
MCH: 30.2 pg (ref 26.0–34.0)
MCHC: 32.5 g/dL (ref 30.0–36.0)
MCV: 92.9 fL (ref 80.0–100.0)
Platelets: 236 10*3/uL (ref 150–400)
RBC: 3.38 MIL/uL — ABNORMAL LOW (ref 4.22–5.81)
RDW: 15.2 % (ref 11.5–15.5)
WBC: 8.9 10*3/uL (ref 4.0–10.5)
nRBC: 0 % (ref 0.0–0.2)

## 2020-06-07 LAB — TROPONIN I (HIGH SENSITIVITY): Troponin I (High Sensitivity): 5 ng/L (ref <18)

## 2020-06-07 NOTE — ED Triage Notes (Signed)
Dialysis patient. MWF, did have full treatment yesterday

## 2020-06-07 NOTE — ED Triage Notes (Signed)
BIB EMS from home. Patient reports CP onset earlier today. Took 1NTG, 324 ASA PTA. Initially hypertensive. Patient in NAD

## 2020-06-08 ENCOUNTER — Other Ambulatory Visit: Payer: Self-pay

## 2020-06-08 LAB — TROPONIN I (HIGH SENSITIVITY): Troponin I (High Sensitivity): 7 ng/L (ref <18)

## 2020-06-08 MED ORDER — FAMOTIDINE 20 MG PO TABS
20.0000 mg | ORAL_TABLET | Freq: Two times a day (BID) | ORAL | 0 refills | Status: AC
Start: 2020-06-08 — End: ?

## 2020-06-08 MED ORDER — PANTOPRAZOLE SODIUM 20 MG PO TBEC: 20.0000 mg | DELAYED_RELEASE_TABLET | Freq: Every day | ORAL | 0 refills | Status: DC

## 2020-06-08 MED ORDER — PANTOPRAZOLE SODIUM 20 MG PO TBEC
20.0000 mg | DELAYED_RELEASE_TABLET | Freq: Once | ORAL | Status: AC
  Administered 2020-06-08: 20 mg via ORAL
  Filled 2020-06-08: qty 1

## 2020-06-08 NOTE — Discharge Instructions (Signed)
At this time there does not appear to be the presence of an emergent medical condition, however there is always the potential for conditions to change. Please read and follow the below instructions.  Please return to the Emergency Department immediately for any new or worsening symptoms. Please be sure to follow up with your Primary Care Provider within one week regarding your visit today; please call their office to schedule an appointment even if you are feeling better for a follow-up visit. Please take the medications Pepcid and Protonix as prescribed to help with symptoms related to acid reflux.  Get help right away if: Your chest pain returns. You have a cough that gets worse, or you cough up blood. You have very bad (severe) pain in your belly (abdomen). You pass out (faint). You have either of these for no clear reason: Sudden chest discomfort. Sudden discomfort in your arms, back, neck, or jaw. You have shortness of breath at any time. You suddenly start to sweat, or your skin gets clammy. You feel sick to your stomach (nauseous). You throw up (vomit). You suddenly feel lightheaded or dizzy. You feel very weak or tired. Your heart starts to beat fast, or it feels like it is skipping beats. You have any new/concerning or worsening of symptoms These symptoms may be an emergency. Do not wait to see if the symptoms will go away. Get medical help right away. Call your local emergency services (911 in the [place] Do not drive yourself to the hospital.  Please read the additional information packets attached to your discharge summary.  Do not take your medicine if  develop an itchy rash, swelling in your mouth or lips, or difficulty breathing; call 911 and seek immediate emergency medical attention if this occurs.  You may review your lab tests and imaging results in their entirety on your MyChart account.  Please discuss all results of fully with your primary care provider and other  specialist at your follow-up visit.  Note: Portions of this text may have been transcribed using voice recognition software. Every effort was made to ensure accuracy; however, inadvertent computerized transcription errors may still be present.

## 2020-06-08 NOTE — ED Provider Notes (Signed)
[facility] EMERGENCY DEPARTMENT Provider Note   CSN: [id number] Arrival date & time: 06/07/20  2109     History Chief Complaint  Patient presents with   Chest Pain    Jesus Ayala is a 69 y.o. male history ESRD, Monday Wednesday Friday, hypertension, hyperlipidemia.  Last dialysis session Monday, received full treatment.  Patient presents for chest pain onset yesterday 06/07/2020 at 1 PM after lunch.  Describes a sharp pain in the center of his chest nonradiating worsened with swallowing, pain was moderate in intensity.  Pain gradually improved and was mild continued until around 9 PM when he called EMS they gave him full dose aspirin and 1 nitro with complete relief of symptoms.  Patient reports he has been pain-free since that time.  Patient reports that he has in fact had similar pain in the past that was relieved with Tums, he did not attempt Tums yesterday.  Symptoms associated with mild nausea yesterday.  Denies fever/chills, headache, neck stiffness, cough/hemoptysis, pleurisy, shortness of breath, exertional pain, abdominal pain, vomiting, diarrhea, extremity swelling/color change or any additional concerns.  Of note patient reports he was seen by his primary care doctor via televisit few days ago for sore throat he reports he has been treated with azithromycin with improvement of his symptoms.  HPI     Past Medical History:  Diagnosis Date   Anemia    Anxiety    situational   Arthritis    Early cataracts, bilateral    ESRD (end stage renal disease) ([place])    MWF- [facility]   HLD (hyperlipidemia)    Hypertension    Wears glasses    Wears partial dentures     Patient Active Problem List   Diagnosis Date Noted   Rash 09/22/2019   Bacteremia due to other bacteria 08/27/2019   SIRS (systemic inflammatory response syndrome) ([place]) 08/25/2019   Acute blood loss anemia 06/25/2019   ESRD (end stage renal disease) ([place])  31/49/7026   Complication of dialysis access insertion 06/25/2019   Acute renal failure superimposed on stage 3 chronic kidney disease ([place]) 04/19/2019   Chest pain 04/19/2019   Hypokalemia 04/19/2019   Normocytic anemia 04/19/2019   HLD (hyperlipidemia)    Hypertension     Past Surgical History:  Procedure Laterality Date   A/V FISTULAGRAM Right 06/03/2020   Procedure: A/V FISTULAGRAM;  Surgeon: Elam Dutch, MD;  Location: [place] [facility];  Service: Cardiovascular;  Laterality: Right;   AV FISTULA PLACEMENT Left 07/14/2019   Procedure: INSERTION OF ARTERIOVENOUS (AV) GORE-TEX GRAFT ARM;  Surgeon: Angelia Mould, MD;  Location: [place];  Service: Vascular;  Laterality: Left;   AV FISTULA PLACEMENT Right 02/23/2020   Procedure: RIGHT ARM ARTERIOVENOUS (AV) FISTULA CREATION;  Surgeon: Elam Dutch, MD;  Location: [place];  Service: Vascular;  Laterality: Right;   COLONOSCOPY     HERNIA REPAIR     unbicial   INSERTION OF DIALYSIS CATHETER Right 06/25/2019   Procedure: INSERTION OF TUNNEL DIALYSIS CATHETER;  Surgeon: Waynetta Sandy, MD;  Location: [place];  Service: Vascular;  Laterality: Right;   IR THROMBECTOMY AV FISTULA W/THROMBOLYSIS/PTA INC/SHUNT/IMG LEFT Left 09/14/2019   IR US GUIDE VASC ACCESS LEFT  09/14/2019   MULTIPLE TOOTH EXTRACTIONS     TEE WITHOUT CARDIOVERSION N/A 08/31/2019   Procedure: TRANSESOPHAGEAL ECHOCARDIOGRAM (TEE);  Surgeon: Pixie Casino, MD;  Location: [place];  Service: Cardiovascular;  Laterality: N/A;       Family  History  Problem Relation Age of Onset   Lung cancer Father    Hypertension Brother     Social History   Tobacco Use   Smoking status: Former Smoker    Years: 2.00    Quit date: 1987    Years since quitting: 34.6   Smokeless tobacco: Never Used  Scientific laboratory technician Use: Never used  Substance Use Topics   Alcohol use: Not Currently   Drug use: No    Home Medications Prior to  Admission medications   Medication Sig Start Date End Date Taking? Authorizing Provider  acetaminophen (TYLENOL) 500 MG tablet Take 1,000 mg by mouth every 6 (six) hours as needed (pain).    Yes [provider]  aspirin 81 MG chewable tablet Chew 81 mg by mouth every other day.    Yes [provider]  azithromycin (ZITHROMAX) 250 MG tablet Take 250 mg by mouth as directed. 06/06/20  Yes [provider]  B Complex-C-Zn-Folic Acid (DIALYVITE 664-QIHK 15 PO) Take 1 tablet by mouth daily at 3 pm.   Yes [provider]  calcium acetate (PHOSLO) 667 MG capsule Take 1 capsule (667 mg total) by mouth 3 (three) times daily with meals. Patient taking differently: Take 1,334 mg by mouth 3 (three) times daily with meals. Take 667 mg with snack 04/22/19  Yes Lavina Hamman, MD  fluticasone [facility]) 50 MCG/ACT nasal spray Place 1 spray into both nostrils in the morning and at bedtime.  01/14/20  Yes [provider]  gabapentin (NEURONTIN) 300 MG capsule Take 300 mg by mouth daily as needed. Can take up to 2 times daily 01/14/20  Yes [provider]  hydrocortisone cream 0.5 % Apply 1 application topically daily as needed for itching.   Yes [provider]  lidocaine (LIDODERM) 5 % Place 1 patch onto the skin daily as needed (pain). Remove & Discard patch within 12 hours or as directed by MD   Yes [provider]  lidocaine-prilocaine (EMLA) cream Apply 1 application topically as needed [facility]).   Yes [provider]  nitroGLYCERIN (NITROSTAT) 0.4 MG SL tablet Place 0.4 mg under the tongue every 5 (five) minutes as needed for chest pain.  03/27/19  Yes [provider]  famotidine (PEPCID) 20 MG tablet Take 1 tablet (20 mg total) by mouth 2 (two) times daily. 06/08/20   Nuala Alpha A, PA-C  hydrALAZINE (APRESOLINE) 25 MG tablet Take 1 tablet (25 mg total) by mouth 3 (three) times daily. Patient not taking: Reported on 05/26/2020  04/22/19   Lavina Hamman, MD  latanoprost (XALATAN) 0.005 % ophthalmic solution Place 1 drop into both eyes at bedtime.    [provider]  pantoprazole (PROTONIX) 20 MG tablet Take 1 tablet (20 mg total) by mouth daily. 06/08/20   Nuala Alpha A, PA-C  traZODone (DESYREL) 100 MG tablet Take 100 mg by mouth at bedtime. 04/02/20   [provider]    Allergies    Ace inhibitors, Enalapril, Iron sucrose, and Venofer  [ferric oxide]  Review of Systems   Review of Systems Ten systems are reviewed and are negative for acute change except as noted in the HPI  Physical Exam Updated Vital Signs BP 130/78 (BP Location: Right Arm)    Pulse 79    Temp 98 F (36.7 C) (Oral)    Resp 14    Ht 5\' 11"  (1.803 m)    Wt 80.3 kg    SpO2 100%  BMI 24.69 kg/m   Physical Exam Constitutional:      General: He is not in acute distress.    Appearance: Normal appearance. He is well-developed. He is not ill-appearing or diaphoretic.  HENT:     Head: Normocephalic and atraumatic.     Mouth/Throat:     Comments: The patient has normal phonation and is in control of secretions. No stridor.  Midline uvula without edema. Soft palate rises symmetrically. No tonsillar erythema, swelling or exudates. Tongue protrusion is normal, floor of mouth is soft. No trismus. No creptius on neck palpation . No gingival erythema or fluctuance noted. Mucus membranes moist. Eyes:     General: Vision grossly intact. Gaze aligned appropriately.     Pupils: Pupils are equal, round, and reactive to light.  Neck:     Trachea: Trachea and phonation normal. No tracheal tenderness or tracheal deviation.     Meningeal: Brudzinski's sign absent.  Cardiovascular:     Rate and Rhythm: Normal rate and regular rhythm.     Pulses:          Posterior tibial pulses are 2+ on the right side and 2+ on the left side.  Pulmonary:     Effort: Pulmonary effort is normal. No respiratory distress.     Breath sounds: Normal breath  sounds.  Abdominal:     General: There is no distension.     Palpations: Abdomen is soft.     Tenderness: There is no abdominal tenderness. There is no guarding or rebound.  Musculoskeletal:        General: Normal range of motion.     Cervical back: Normal range of motion and neck supple. No rigidity or crepitus.     Right lower leg: No tenderness. No edema.     Left lower leg: No tenderness. No edema.  Skin:    General: Skin is warm and dry.  Neurological:     Mental Status: He is alert.     GCS: GCS eye subscore is 4. GCS verbal subscore is 5. GCS motor subscore is 6.     Comments: Speech is clear and goal oriented, follows commands Major Cranial nerves without deficit, no facial droop Moves extremities without ataxia, coordination intact  Psychiatric:        Behavior: Behavior normal.     ED Results / Procedures / Treatments   Labs (all labs ordered are listed, but only abnormal results are displayed) Labs Reviewed  BASIC METABOLIC PANEL - Abnormal; Notable for the following components:      Result Value   Sodium 132 (*)    Chloride 93 (*)    Glucose, Bld 111 (*)    BUN 24 (*)    Creatinine, Ser 10.37 (*)    GFR calc non Af Amer 5 (*)    GFR calc Af Amer 5 (*)    All other components within normal limits  CBC - Abnormal; Notable for the following components:   RBC 3.38 (*)    Hemoglobin 10.2 (*)    HCT 31.4 (*)    All other components within normal limits  TROPONIN I (HIGH SENSITIVITY)  TROPONIN I (HIGH SENSITIVITY)    EKG EKG Interpretation  Date/Time:  Tuesday June 07 2020 21:12:09 EDT Ventricular Rate:  82 PR Interval:  146 QRS Duration: 98 QT Interval:  376 QTC Calculation: 439 R Axis:   -29 Text Interpretation: Normal sinus rhythm Left ventricular hypertrophy with repolarization abnormality ( R in aVL , Cornell product )  Confirmed by Veatrice Kells [phone]) on 06/08/2020 8:33:45 AM   Radiology DG Chest 2 View  Result Date: 06/07/2020 CLINICAL  DATA:  Chest pain EXAM: CHEST - 2 VIEW COMPARISON:  08/24/2019 FINDINGS: Dual lumen right IJ approach central venous catheter tip is at the cavoatrial junction. The lungs are clear. No pleural effusion or pneumothorax. IMPRESSION: Clear lungs. Electronically Signed   By: Ulyses Jarred M.D.   On: 06/07/2020 21:56    Procedures Procedures (including critical care time)  Medications Ordered in ED Medications  pantoprazole (PROTONIX) EC tablet 20 mg (has no administration in time range)    ED Course  I have reviewed the triage vital signs and the nursing notes.  Pertinent labs & imaging results that were available during my care of the patient were reviewed by me and considered in my medical decision making (see chart for details).    MDM Rules/Calculators/A&P                          Additional history obtained from: 1. Nursing notes from this visit. ----------------------------------------------- 69 year old male history as detailed above presented for chest pain that began yesterday at around 1 PM.  It continued to around 9 PM when he received aspirin and nitro by EMS.  Pain was worse with swallowing and was nonexertional.  He reports similar pain in the past that improved with Tums but he did not attempt any antacids yesterday.  He arrived in the ER around 9 PM last night and has been in the department around 13 hours prior to my initial evaluation.  He is remained pain-free throughout that time he denies any current concerns.  Chest pain work-up was obtained in triage and reviewed below.  Initial and high-sensitivity troponins are within normal limits and without emergent elevations.  Symptom onset at 1 PM no indication for third troponin, doubt ACS as etiology of symptoms today. CBC shows anemia of 10.2, dialysis patient, no bleeding symptoms, symptoms not suggestive of symptomatic anemia.  Additionally no leukocytosis to suggest infection. BMP shows elevated creatinine and BUN which is  consistent with ESRD status, no emergent electrolyte derangement, potassium 4.2.  No gap.  Chest x-ray IMPRESSION:  Clear lungs.   EKG: Normal sinus rhythm Left ventricular hypertrophy with repolarization abnormality ( R in aVL , Cornell product ) Confirmed by Randal Buba, April ([id number]) on 06/08/2020 8:33:45 AM ----------------- Patient symptoms today are suggestive of acid reflux, will treat patient with Pepcid/Protonix and encourage PCP follow-up.  No exertional pain and reassuring work-up above.  Additionally vital signs stable, no tachycardia hypotension hypoxia tachypnea.  Doubt ACS, PE, dissection, pneumonia, symptomatic anemia or other emergent cardiopulmonary etiologies at this time.   Patient was seen and evaluated by Dr. Regenia Skeeter during this visit who agrees with work-up and discharge with acid reflux medications.  At this time there does not appear to be any evidence of an acute emergency medical condition and the patient appears stable for discharge with appropriate outpatient follow up. Diagnosis was discussed with patient who verbalizes understanding of care plan and is agreeable to discharge. I have discussed return precautions with patient who verbalizes understanding. Patient encouraged to follow-up with their PCP. All questions answered.  Note: Portions of this report may have been transcribed using voice recognition software. Every effort was made to ensure accuracy; however, inadvertent computerized transcription errors may still be present. Final Clinical Impression(s) / ED Diagnoses Final diagnoses:  Atypical chest pain    Rx /  DC Orders ED Discharge Orders         Ordered    famotidine (PEPCID) 20 MG tablet  2 times daily     Discontinue  Reprint     06/08/20 1232    pantoprazole (PROTONIX) 20 MG tablet  Daily     Discontinue  Reprint     06/08/20 1232           Deliah Boston, PA-C 06/08/20 1243    Sherwood Gambler, MD 06/09/20 1708

## 2020-06-10 DIAGNOSIS — T8249XD Other complication of vascular dialysis catheter, subsequent encounter: Secondary | ICD-10-CM | POA: Diagnosis not present

## 2020-06-10 DIAGNOSIS — D689 Coagulation defect, unspecified: Secondary | ICD-10-CM | POA: Diagnosis not present

## 2020-06-10 DIAGNOSIS — D631 Anemia in chronic kidney disease: Secondary | ICD-10-CM | POA: Diagnosis not present

## 2020-06-10 DIAGNOSIS — L299 Pruritus, unspecified: Secondary | ICD-10-CM | POA: Diagnosis not present

## 2020-06-10 DIAGNOSIS — Z992 Dependence on renal dialysis: Secondary | ICD-10-CM | POA: Diagnosis not present

## 2020-06-10 DIAGNOSIS — N2581 Secondary hyperparathyroidism of renal origin: Secondary | ICD-10-CM | POA: Diagnosis not present

## 2020-06-10 DIAGNOSIS — N186 End stage renal disease: Secondary | ICD-10-CM | POA: Diagnosis not present

## 2020-06-16 DIAGNOSIS — T7840XA Allergy, unspecified, initial encounter: Secondary | ICD-10-CM | POA: Insufficient documentation

## 2020-06-21 DIAGNOSIS — I129 Hypertensive chronic kidney disease with stage 1 through stage 4 chronic kidney disease, or unspecified chronic kidney disease: Secondary | ICD-10-CM | POA: Diagnosis not present

## 2020-06-21 DIAGNOSIS — Z992 Dependence on renal dialysis: Secondary | ICD-10-CM | POA: Diagnosis not present

## 2020-06-21 DIAGNOSIS — N186 End stage renal disease: Secondary | ICD-10-CM | POA: Diagnosis not present

## 2020-06-22 DIAGNOSIS — T782XXA Anaphylactic shock, unspecified, initial encounter: Secondary | ICD-10-CM | POA: Diagnosis not present

## 2020-06-22 DIAGNOSIS — D509 Iron deficiency anemia, unspecified: Secondary | ICD-10-CM | POA: Diagnosis not present

## 2020-06-22 DIAGNOSIS — N186 End stage renal disease: Secondary | ICD-10-CM | POA: Diagnosis not present

## 2020-06-22 DIAGNOSIS — T8249XD Other complication of vascular dialysis catheter, subsequent encounter: Secondary | ICD-10-CM | POA: Diagnosis not present

## 2020-06-22 DIAGNOSIS — L299 Pruritus, unspecified: Secondary | ICD-10-CM | POA: Diagnosis not present

## 2020-06-22 DIAGNOSIS — Z992 Dependence on renal dialysis: Secondary | ICD-10-CM | POA: Diagnosis not present

## 2020-06-22 DIAGNOSIS — Z23 Encounter for immunization: Secondary | ICD-10-CM | POA: Diagnosis not present

## 2020-06-22 DIAGNOSIS — N2581 Secondary hyperparathyroidism of renal origin: Secondary | ICD-10-CM | POA: Diagnosis not present

## 2020-06-22 DIAGNOSIS — D631 Anemia in chronic kidney disease: Secondary | ICD-10-CM | POA: Diagnosis not present

## 2020-06-22 DIAGNOSIS — D689 Coagulation defect, unspecified: Secondary | ICD-10-CM | POA: Diagnosis not present

## 2020-06-24 ENCOUNTER — Other Ambulatory Visit: Payer: Self-pay

## 2020-06-24 ENCOUNTER — Encounter (HOSPITAL_COMMUNITY): Payer: Self-pay | Admitting: Vascular Surgery

## 2020-06-24 DIAGNOSIS — Z20822 Contact with and (suspected) exposure to covid-19: Secondary | ICD-10-CM | POA: Diagnosis not present

## 2020-06-24 DIAGNOSIS — J069 Acute upper respiratory infection, unspecified: Secondary | ICD-10-CM | POA: Diagnosis not present

## 2020-06-24 NOTE — Progress Notes (Signed)
Pt denies SOB, chest pain, and being under the care of a cardiologist.Pt stated that PCP is Wilnette Kales, [place]. Pt denies having a cardiac cath. Pt stated that he was seen by PCP for a sore throat . Pt scheduled for a COVID test on 06/25/20. Pt made aware to stop takingvitamins, fish oil and herbal medications. Do not take any NSAIDs ie: Ibuprofen, Advil, Naproxen (Aleve), Motrin, BC and Goody Powder. Pt reminded to quarantine. Pt verbalized understanding of all pre-op instructions.

## 2020-06-25 ENCOUNTER — Other Ambulatory Visit (HOSPITAL_COMMUNITY)
Admission: RE | Admit: 2020-06-25 | Discharge: 2020-06-25 | Disposition: A | Discharge status: Home / Self Care | Payer: Medicare HMO | Source: Ambulatory Visit | Attending: Vascular Surgery | Admitting: Vascular Surgery

## 2020-06-25 DIAGNOSIS — Z20822 Contact with and (suspected) exposure to covid-19: Secondary | ICD-10-CM | POA: Insufficient documentation

## 2020-06-25 DIAGNOSIS — Z01812 Encounter for preprocedural laboratory examination: Secondary | ICD-10-CM | POA: Insufficient documentation

## 2020-06-25 LAB — SARS CORONAVIRUS 2 (TAT 6-24 HRS): SARS Coronavirus 2: NEGATIVE

## 2020-06-27 NOTE — Anesthesia Preprocedure Evaluation (Addendum)
Anesthesia Evaluation  Patient identified by MRN, date of birth, ID band Patient awake    Reviewed: Allergy & Precautions, NPO status , Patient's Chart, lab work & pertinent test results, reviewed documented beta blocker date and time   Airway Mallampati: II  TM Distance: >3 FB Neck ROM: Full    Dental  (+) Teeth Intact, Missing,    Pulmonary former smoker,    Pulmonary exam normal breath sounds clear to auscultation       Cardiovascular hypertension, Pt. on home beta blockers Normal cardiovascular exam Rhythm:Regular Rate:Normal     Neuro/Psych PSYCHIATRIC DISORDERS Anxiety negative neurological ROS     GI/Hepatic Neg liver ROS, GERD  Medicated and Poorly Controlled,  Endo/Other  negative endocrine ROS  Renal/GU ESRF and DialysisRenal disease (MWF)     Musculoskeletal  (+) Arthritis ,   Abdominal   Peds  Hematology negative hematology ROS (+)   Anesthesia Other Findings   Reproductive/Obstetrics                            Anesthesia Physical Anesthesia Plan  ASA: III  Anesthesia Plan: General   Post-op Pain Management:    Induction: Intravenous  PONV Risk Score and Plan: 2 and Midazolam and Ondansetron  Airway Management Planned: Oral ETT  Additional Equipment:   Intra-op Plan:   Post-operative Plan: Extubation in OR  Informed Consent: I have reviewed the patients History and Physical, chart, labs and discussed the procedure including the risks, benefits and alternatives for the proposed anesthesia with the patient or authorized representative who has indicated his/her understanding and acceptance.       Plan Discussed with: CRNA  Anesthesia Plan Comments:        Anesthesia Quick Evaluation

## 2020-06-28 ENCOUNTER — Encounter (HOSPITAL_COMMUNITY): Payer: Self-pay | Admitting: Vascular Surgery

## 2020-06-28 ENCOUNTER — Ambulatory Visit (HOSPITAL_COMMUNITY): Payer: Medicare HMO | Admitting: Anesthesiology

## 2020-06-28 ENCOUNTER — Encounter (HOSPITAL_COMMUNITY)
Admission: RE | Disposition: A | Discharge status: Home / Self Care | Payer: Self-pay | Source: Home / Self Care | Attending: Vascular Surgery

## 2020-06-28 ENCOUNTER — Ambulatory Visit (HOSPITAL_COMMUNITY)
Admission: RE | Admit: 2020-06-28 | Discharge: 2020-06-28 | Disposition: A | Discharge status: Home / Self Care | Payer: Medicare HMO | Attending: Vascular Surgery | Admitting: Vascular Surgery

## 2020-06-28 ENCOUNTER — Other Ambulatory Visit: Payer: Self-pay

## 2020-06-28 DIAGNOSIS — Z87891 Personal history of nicotine dependence: Secondary | ICD-10-CM | POA: Insufficient documentation

## 2020-06-28 DIAGNOSIS — K219 Gastro-esophageal reflux disease without esophagitis: Secondary | ICD-10-CM | POA: Insufficient documentation

## 2020-06-28 DIAGNOSIS — F419 Anxiety disorder, unspecified: Secondary | ICD-10-CM | POA: Insufficient documentation

## 2020-06-28 DIAGNOSIS — E785 Hyperlipidemia, unspecified: Secondary | ICD-10-CM | POA: Diagnosis not present

## 2020-06-28 DIAGNOSIS — Z992 Dependence on renal dialysis: Secondary | ICD-10-CM | POA: Insufficient documentation

## 2020-06-28 DIAGNOSIS — I120 Hypertensive chronic kidney disease with stage 5 chronic kidney disease or end stage renal disease: Secondary | ICD-10-CM | POA: Diagnosis not present

## 2020-06-28 DIAGNOSIS — D631 Anemia in chronic kidney disease: Secondary | ICD-10-CM | POA: Diagnosis not present

## 2020-06-28 DIAGNOSIS — N186 End stage renal disease: Secondary | ICD-10-CM | POA: Insufficient documentation

## 2020-06-28 DIAGNOSIS — N185 Chronic kidney disease, stage 5: Secondary | ICD-10-CM | POA: Diagnosis not present

## 2020-06-28 DIAGNOSIS — M1990 Unspecified osteoarthritis, unspecified site: Secondary | ICD-10-CM | POA: Diagnosis not present

## 2020-06-28 DIAGNOSIS — R69 Illness, unspecified: Secondary | ICD-10-CM | POA: Diagnosis not present

## 2020-06-28 DIAGNOSIS — H409 Unspecified glaucoma: Secondary | ICD-10-CM | POA: Insufficient documentation

## 2020-06-28 DIAGNOSIS — Z888 Allergy status to other drugs, medicaments and biological substances status: Secondary | ICD-10-CM | POA: Diagnosis not present

## 2020-06-28 HISTORY — DX: Other seasonal allergic rhinitis: J30.2

## 2020-06-28 HISTORY — PX: AV FISTULA PLACEMENT: SHX1204

## 2020-06-28 HISTORY — DX: Unspecified glaucoma: H40.9

## 2020-06-28 HISTORY — DX: Gastro-esophageal reflux disease without esophagitis: K21.9

## 2020-06-28 LAB — POCT I-STAT, CHEM 8
BUN: 20 mg/dL (ref 8–23)
Calcium, Ion: 1.08 mmol/L — ABNORMAL LOW (ref 1.15–1.40)
Chloride: 97 mmol/L — ABNORMAL LOW (ref 98–111)
Creatinine, Ser: 9.4 mg/dL — ABNORMAL HIGH (ref 0.61–1.24)
Glucose, Bld: 97 mg/dL (ref 70–99)
HCT: 35 % — ABNORMAL LOW (ref 39.0–52.0)
Hemoglobin: 11.9 g/dL — ABNORMAL LOW (ref 13.0–17.0)
Potassium: 3.7 mmol/L (ref 3.5–5.1)
Sodium: 134 mmol/L — ABNORMAL LOW (ref 135–145)
TCO2: 26 mmol/L (ref 22–32)

## 2020-06-28 SURGERY — INSERTION OF ARTERIOVENOUS (AV) GORE-TEX GRAFT ARM
Anesthesia: General | Site: Arm Upper | Laterality: Right

## 2020-06-28 MED ORDER — ACETAMINOPHEN 500 MG PO TABS
1000.0000 mg | ORAL_TABLET | Freq: Once | ORAL | Status: AC
  Administered 2020-06-28: 1000 mg via ORAL
  Filled 2020-06-28: qty 2

## 2020-06-28 MED ORDER — EPHEDRINE SULFATE-NACL 50-0.9 MG/10ML-% IV SOSY
PREFILLED_SYRINGE | INTRAVENOUS | Status: DC | PRN
  Administered 2020-06-28 (×2): 5 mg via INTRAVENOUS

## 2020-06-28 MED ORDER — LIDOCAINE 2% (20 MG/ML) 5 ML SYRINGE
INTRAMUSCULAR | Status: DC | PRN
  Administered 2020-06-28: 80 mg via INTRAVENOUS

## 2020-06-28 MED ORDER — SODIUM CHLORIDE 0.9 % IV SOLN
INTRAVENOUS | Status: AC
  Filled 2020-06-28: qty 1.2

## 2020-06-28 MED ORDER — PHENYLEPHRINE HCL (PRESSORS) 10 MG/ML IV SOLN
INTRAVENOUS | Status: DC | PRN
  Administered 2020-06-28: 120 ug via INTRAVENOUS

## 2020-06-28 MED ORDER — ONDANSETRON HCL 4 MG/2ML IJ SOLN
INTRAMUSCULAR | Status: DC | PRN
  Administered 2020-06-28: 4 mg via INTRAVENOUS

## 2020-06-28 MED ORDER — HEMOSTATIC AGENTS (NO CHARGE) OPTIME
TOPICAL | Status: DC | PRN
  Administered 2020-06-28: 1 via TOPICAL

## 2020-06-28 MED ORDER — PROTAMINE SULFATE 10 MG/ML IV SOLN
INTRAVENOUS | Status: DC | PRN
  Administered 2020-06-28: 50 mg via INTRAVENOUS

## 2020-06-28 MED ORDER — HEPARIN SODIUM (PORCINE) 1000 UNIT/ML IJ SOLN
INTRAMUSCULAR | Status: DC | PRN
  Administered 2020-06-28: 5000 [IU] via INTRAVENOUS

## 2020-06-28 MED ORDER — HEPARIN SODIUM (PORCINE) 1000 UNIT/ML IJ SOLN
INTRAMUSCULAR | Status: AC
  Filled 2020-06-28: qty 1

## 2020-06-28 MED ORDER — CHLORHEXIDINE GLUCONATE 0.12 % MT SOLN
OROMUCOSAL | Status: AC
  Administered 2020-06-28: 15 mL
  Filled 2020-06-28: qty 15

## 2020-06-28 MED ORDER — PHENYLEPHRINE 40 MCG/ML (10ML) SYRINGE FOR IV PUSH (FOR BLOOD PRESSURE SUPPORT)
PREFILLED_SYRINGE | INTRAVENOUS | Status: DC | PRN
  Administered 2020-06-28: 40 ug via INTRAVENOUS
  Administered 2020-06-28 (×5): 80 ug via INTRAVENOUS
  Administered 2020-06-28: 40 ug via INTRAVENOUS
  Administered 2020-06-28: 80 ug via INTRAVENOUS

## 2020-06-28 MED ORDER — CHLORHEXIDINE GLUCONATE 4 % EX LIQD: 60.0000 mL | Freq: Once | CUTANEOUS | Status: DC

## 2020-06-28 MED ORDER — HYDROCODONE-ACETAMINOPHEN 5-325 MG PO TABS
1.0000 | ORAL_TABLET | Freq: Four times a day (QID) | ORAL | 0 refills | Status: DC | PRN
Start: 2020-06-28 — End: 2022-11-11

## 2020-06-28 MED ORDER — MIDAZOLAM HCL 5 MG/5ML IJ SOLN
INTRAMUSCULAR | Status: DC | PRN
  Administered 2020-06-28: 2 mg via INTRAVENOUS

## 2020-06-28 MED ORDER — 0.9 % SODIUM CHLORIDE (POUR BTL) OPTIME
TOPICAL | Status: DC | PRN
  Administered 2020-06-28: 1000 mL

## 2020-06-28 MED ORDER — FAMOTIDINE 20 MG PO TABS
20.0000 mg | ORAL_TABLET | Freq: Once | ORAL | Status: AC
  Administered 2020-06-28: 20 mg via ORAL

## 2020-06-28 MED ORDER — DEXAMETHASONE SODIUM PHOSPHATE 10 MG/ML IJ SOLN
INTRAMUSCULAR | Status: AC
  Filled 2020-06-28: qty 1

## 2020-06-28 MED ORDER — DEXAMETHASONE SODIUM PHOSPHATE 10 MG/ML IJ SOLN
INTRAMUSCULAR | Status: DC | PRN
  Administered 2020-06-28: 5 mg via INTRAVENOUS

## 2020-06-28 MED ORDER — SUGAMMADEX SODIUM 200 MG/2ML IV SOLN
INTRAVENOUS | Status: DC | PRN
  Administered 2020-06-28: 200 mg via INTRAVENOUS

## 2020-06-28 MED ORDER — SODIUM CHLORIDE 0.9 % IV SOLN
INTRAVENOUS | Status: DC | PRN
  Administered 2020-06-28: 500 mL

## 2020-06-28 MED ORDER — MIDAZOLAM HCL 2 MG/2ML IJ SOLN
INTRAMUSCULAR | Status: AC
  Filled 2020-06-28: qty 2

## 2020-06-28 MED ORDER — ROCURONIUM BROMIDE 100 MG/10ML IV SOLN
INTRAVENOUS | Status: DC | PRN
  Administered 2020-06-28: 50 mg via INTRAVENOUS

## 2020-06-28 MED ORDER — SOD CITRATE-CITRIC ACID 500-334 MG/5ML PO SOLN
30.0000 mL | Freq: Once | ORAL | Status: AC
  Administered 2020-06-28: 30 mL via ORAL

## 2020-06-28 MED ORDER — ONDANSETRON HCL 4 MG/2ML IJ SOLN
INTRAMUSCULAR | Status: AC
  Filled 2020-06-28: qty 2

## 2020-06-28 MED ORDER — PROTAMINE SULFATE 10 MG/ML IV SOLN
INTRAVENOUS | Status: AC
  Filled 2020-06-28: qty 5

## 2020-06-28 MED ORDER — FENTANYL CITRATE (PF) 250 MCG/5ML IJ SOLN
INTRAMUSCULAR | Status: DC | PRN
Start: 2020-06-28 — End: 2020-06-28
  Administered 2020-06-28 (×3): 50 ug via INTRAVENOUS

## 2020-06-28 MED ORDER — SODIUM CHLORIDE 0.9 % IV SOLN: INTRAVENOUS | Status: DC

## 2020-06-28 MED ORDER — SOD CITRATE-CITRIC ACID 500-334 MG/5ML PO SOLN
ORAL | Status: AC
  Filled 2020-06-28: qty 30

## 2020-06-28 MED ORDER — PHENYLEPHRINE 40 MCG/ML (10ML) SYRINGE FOR IV PUSH (FOR BLOOD PRESSURE SUPPORT)
PREFILLED_SYRINGE | INTRAVENOUS | Status: AC
  Filled 2020-06-28: qty 10

## 2020-06-28 MED ORDER — ESMOLOL HCL 100 MG/10ML IV SOLN
INTRAVENOUS | Status: AC
  Filled 2020-06-28: qty 10

## 2020-06-28 MED ORDER — FENTANYL CITRATE (PF) 250 MCG/5ML IJ SOLN
INTRAMUSCULAR | Status: AC
  Filled 2020-06-28: qty 5

## 2020-06-28 MED ORDER — PROPOFOL 10 MG/ML IV BOLUS
INTRAVENOUS | Status: AC
  Filled 2020-06-28: qty 20

## 2020-06-28 MED ORDER — PROPOFOL 10 MG/ML IV BOLUS
INTRAVENOUS | Status: DC | PRN
  Administered 2020-06-28: 100 mg via INTRAVENOUS

## 2020-06-28 MED ORDER — LIDOCAINE HCL (PF) 1 % IJ SOLN
INTRAMUSCULAR | Status: AC
  Filled 2020-06-28: qty 30

## 2020-06-28 MED ORDER — CEFAZOLIN SODIUM-DEXTROSE 2-4 GM/100ML-% IV SOLN
2.0000 g | INTRAVENOUS | Status: AC
  Administered 2020-06-28: 2 g via INTRAVENOUS
  Filled 2020-06-28: qty 100

## 2020-06-28 MED ORDER — EPHEDRINE 5 MG/ML INJ
INTRAVENOUS | Status: AC
  Filled 2020-06-28: qty 10

## 2020-06-28 MED ORDER — ROCURONIUM BROMIDE 10 MG/ML (PF) SYRINGE
PREFILLED_SYRINGE | INTRAVENOUS | Status: AC
  Filled 2020-06-28: qty 10

## 2020-06-28 MED ORDER — FAMOTIDINE 20 MG PO TABS
ORAL_TABLET | ORAL | Status: AC
  Filled 2020-06-28: qty 1

## 2020-06-28 MED ORDER — PHENYLEPHRINE HCL-NACL 10-0.9 MG/250ML-% IV SOLN
INTRAVENOUS | Status: DC | PRN
  Administered 2020-06-28: 25 ug/min via INTRAVENOUS

## 2020-06-28 SURGICAL SUPPLY — 37 items
ADH SKN CLS APL DERMABOND .7 (GAUZE/BANDAGES/DRESSINGS) ×1
AGENT HMST SPONGE THK3/8 (HEMOSTASIS) ×1
ARMBAND PINK RESTRICT EXTREMIT (MISCELLANEOUS) ×6 IMPLANT
CANISTER SUCT 3000ML PPV (MISCELLANEOUS) ×3 IMPLANT
CANNULA VESSEL 3MM 2 BLNT TIP (CANNULA) ×5 IMPLANT
CLIP VESOCCLUDE MED 6/CT (CLIP) ×3 IMPLANT
CLIP VESOCCLUDE SM WIDE 6/CT (CLIP) ×3 IMPLANT
COVER WAND RF STERILE (DRAPES) ×3 IMPLANT
DECANTER SPIKE VIAL GLASS SM (MISCELLANEOUS) ×1 IMPLANT
DERMABOND ADVANCED (GAUZE/BANDAGES/DRESSINGS) ×2
DERMABOND ADVANCED .7 DNX12 (GAUZE/BANDAGES/DRESSINGS) ×1 IMPLANT
ELECT REM PT RETURN 9FT ADLT (ELECTROSURGICAL) ×3
ELECTRODE REM PT RTRN 9FT ADLT (ELECTROSURGICAL) ×1 IMPLANT
GLOVE BIO SURGEON STRL SZ7 (GLOVE) ×2 IMPLANT
GLOVE BIO SURGEON STRL SZ7.5 (GLOVE) ×5 IMPLANT
GLOVE BIOGEL M 7.0 STRL (GLOVE) ×2 IMPLANT
GLOVE BIOGEL PI IND STRL 7.5 (GLOVE) IMPLANT
GLOVE BIOGEL PI INDICATOR 7.5 (GLOVE) ×6
GOWN STRL REUS W/ TWL LRG LVL3 (GOWN DISPOSABLE) ×3 IMPLANT
GOWN STRL REUS W/TWL LRG LVL3 (GOWN DISPOSABLE) ×9
GRAFT GORETEX STRT 4-7X45 (Vascular Products) ×2 IMPLANT
HEMOSTAT SPONGE AVITENE ULTRA (HEMOSTASIS) ×2 IMPLANT
KIT BASIN OR (CUSTOM PROCEDURE TRAY) ×3 IMPLANT
KIT TURNOVER KIT B (KITS) ×3 IMPLANT
LOOP VESSEL MINI RED (MISCELLANEOUS) ×2 IMPLANT
NS IRRIG 1000ML POUR BTL (IV SOLUTION) ×3 IMPLANT
PACK CV ACCESS (CUSTOM PROCEDURE TRAY) ×3 IMPLANT
PAD ARMBOARD 7.5X6 YLW CONV (MISCELLANEOUS) ×6 IMPLANT
SUT PROLENE 6 0 CC (SUTURE) ×8 IMPLANT
SUT VIC AB 3-0 SH 27 (SUTURE) ×6
SUT VIC AB 3-0 SH 27X BRD (SUTURE) ×2 IMPLANT
SUT VIC AB 4-0 PS2 18 (SUTURE) ×4 IMPLANT
SUT VICRYL 4-0 PS2 18IN ABS (SUTURE) ×6 IMPLANT
SYR TOOMEY 50ML (SYRINGE) IMPLANT
TOWEL GREEN STERILE (TOWEL DISPOSABLE) ×3 IMPLANT
UNDERPAD 30X36 HEAVY ABSORB (UNDERPADS AND DIAPERS) ×3 IMPLANT
WATER STERILE IRR 1000ML POUR (IV SOLUTION) ×3 IMPLANT

## 2020-06-28 NOTE — Anesthesia Postprocedure Evaluation (Signed)
Anesthesia Post Note  Patient: Jesus Ayala  Procedure(s) Performed: INSERTION OF RIGHT UPPER ARM ARTERIOVENOUS (AV) GORE-TEX GRAFT, Ligation of Right Brachial/Cephalic artery. (Right Arm Upper)     Patient location during evaluation: PACU Anesthesia Type: General Level of consciousness: awake and alert Pain management: pain level controlled Vital Signs Assessment: post-procedure vital signs reviewed and stable Respiratory status: spontaneous breathing, nonlabored ventilation, respiratory function stable and patient connected to nasal cannula oxygen Cardiovascular status: blood pressure returned to baseline and stable Postop Assessment: no apparent nausea or vomiting Anesthetic complications: no   No complications documented.  Last Vitals:  Vitals:   06/28/20 0945 06/28/20 1000  BP: 125/66 120/72  Pulse: (!) 103 96  Resp: 15 15  Temp:  36.6 C  SpO2: 100% 100%    Last Pain:  Vitals:   06/28/20 1000  TempSrc:   PainSc: 0-No pain                 Catalina Gravel

## 2020-06-28 NOTE — Anesthesia Procedure Notes (Signed)
Procedure Name: Intubation Date/Time: 06/28/2020 7:37 AM Performed by: Michele Rockers, CRNA Pre-anesthesia Checklist: Patient identified, Emergency Drugs available, Suction available and Patient being monitored Patient Re-evaluated:Patient Re-evaluated prior to induction Oxygen Delivery Method: Circle system utilized Preoxygenation: Pre-oxygenation with 100% oxygen Induction Type: IV induction Ventilation: Mask ventilation without difficulty Laryngoscope Size: Miller and 2 Grade View: Grade I Tube type: Oral Tube size: 7.5 mm Number of attempts: 1 Airway Equipment and Method: Stylet and Oral airway Placement Confirmation: ETT inserted through vocal cords under direct vision,  positive ETCO2 and breath sounds checked- equal and bilateral Secured at: 22 cm Tube secured with: Tape Dental Injury: Teeth and Oropharynx as per pre-operative assessment

## 2020-06-28 NOTE — Op Note (Signed)
Procedure: Right upper arm AV graft, ligation right brachial cephalic AV fistula  Preoperative diagnosis: End-stage renal disease  Postoperative diagnosis: Same  Anesthesia: General  Assistant: Arlee Muslim, PA-C for expediting procedure and construction of anastomosis  Operative findings: 1 5 mm axillary vein end-to-side anastomosis  2.  4 to 7 mm tapered PTFE graft  Operative details: After obtaining form consent, the patient taken the operating room.  Patient placed supine position operating table.  After induction general anesthesia patient's right upper extremity was prepped and draped in usual sterile fashion.  Longitudinal incision was made near the antecubital crease carried down through the subcutaneous tissues down the level of the brachial artery.  Had a good pulse within it.  It is proximally 4 mm in diameter.  It was dissected free circumferentially and Vesseloops placed around this.  Next a longitudinal incision was made in the upper arm near the axilla incision was carried down through the subtenons tissues through the fascia and down the level axillary vein.  It was about 5 mm in diameter.  It was dissected free circumferentially.  Small side branches were ligated divided tween silk ties.  Next a subcutaneous tunnel was created connecting the upper arm to the lower arm incision and a 4 to 7 mm PTFE graft was brought through this.  Patient was given 5000 intervention venous heparin.  After 2 minutes of circulation time Vesseloops were used to control the brachial artery and longitudinal opening was made in the brachial artery.  4 mm into the graft was beveled slightly and sewn end of graft to side of artery using a running 6-0 Prolene suture.  Just prior to completion anastomosis it was for blood backbled and thoroughly flushed.  Estimates was secured clamps released was pulsatile flow in the graft immediately.  Attention was then turned to the axilla.  The vein was controlled proximally  and distally with 5 below clamps.  Longitudinal opening was made in the axillary vein and the graft beveled and cut to length and sewn endograft to side of vein using running 6-0 Prolene suture.  Just prior to completion anastomosis it was for blood backbled and thoroughly flushed anastomosis was secured clamps released there is palpable thrill in the graft immediately.  Hemostasis was obtained with direct pressure Avitene and 50 mg of protamine.  At this point there was still pulsatile flow in a pre-existing brachiocephalic AV fistula.  Since this had not completely occluded I was concerned with steal so I also made a transverse incision over this near the antecubital crease dissected free circumferentially and ligated it with a 2-0 silk tie.  All 3 incisions were then closed with running 3-0 Vicryl subcutaneous layer and 4-0 Vicryl subcuticular stitch in the skin.  The incision over the fistula was closed with a 4-0 Vicryl subcuticular stitch.  Dermabond was applied to the incisions.  Patient tolerated procedure well and there were no complications.  Instrument sponge needle counts correct in the case.  Patient was taken to recovery in stable condition.  Ruta Hinds, MD [facility] Office: [phone]

## 2020-06-28 NOTE — Discharge Instructions (Signed)
° °  [facility] ° °Discharge Instructions ° °AV Fistula or Graft Surgery for Dialysis Access ° °Please refer to the following instructions for your post-procedure care. Your surgeon or physician assistant will discuss any changes with you. ° °Activity ° °You may drive the day following your surgery, if you are comfortable and no longer taking prescription pain medication. Resume full activity as the soreness in your incision resolves. ° °Bathing/Showering ° °You may shower after you go home. Keep your incision dry for 48 hours. Do not soak in a bathtub, hot tub, or swim until the incision heals completely. You may not shower if you have a hemodialysis catheter. ° °Incision Care ° °Clean your incision with mild soap and water after 48 hours. Pat the area dry with a clean towel. You do not need a bandage unless otherwise instructed. Do not apply any ointments or creams to your incision. You may have skin glue on your incision. Do not peel it off. It will come off on its own in about one week. Your arm may swell a bit after surgery. To reduce swelling use pillows to elevate your arm so it is above your heart. Your doctor will tell you if you need to lightly wrap your arm with an ACE bandage. ° °Diet ° °Resume your normal diet. There are not special food restrictions following this procedure. In order to heal from your surgery, it is CRITICAL to get adequate nutrition. Your body requires vitamins, minerals, and protein. Vegetables are the best source of vitamins and minerals. Vegetables also provide the perfect balance of protein. Processed food has little nutritional value, so try to avoid this. ° °Medications ° °Resume taking all of your medications. If your incision is causing pain, you may take over-the counter pain relievers such as acetaminophen (Tylenol). If you were prescribed a stronger pain medication, please be aware these medications can cause nausea and constipation. Prevent  nausea by taking the medication with a snack or meal. Avoid constipation by drinking plenty of fluids and eating foods with high amount of fiber, such as fruits, vegetables, and grains. Do not take Tylenol if you are taking prescription pain medications. ° ° ° ° °Follow up °Your surgeon may want to see you in the office following your access surgery. If so, this will be arranged at the time of your surgery. ° °Please call us immediately for any of the following conditions: ° °Increased pain, redness, drainage (pus) from your incision site °Fever of 101 degrees or higher °Severe or worsening pain at your incision site °Hand pain or numbness. ° °Reduce your risk of vascular disease: ° °Stop smoking. If you would like help, call [facility] at 1-800-QUIT-NOW (1-800-784-8669) or [facility] at 336-586-4000 ° °Manage your cholesterol °Maintain a desired weight °Control your diabetes °Keep your blood pressure down ° °Dialysis ° °It will take several weeks to several months for your new dialysis access to be ready for use. Your surgeon will determine when it is OK to use it. Your nephrologist will continue to direct your dialysis. You can continue to use your Permcath until your new access is ready for use. ° °If you have any questions, please call the office at [phone]. ° °

## 2020-06-28 NOTE — H&P (Signed)
[facility] SHORT STAY H&P   HPI: Pt needs new access currently has catheter  Past Medical History:  Diagnosis Date  . Anemia   . Anxiety    situational  . Arthritis   . Early cataracts, bilateral   . ESRD (end stage renal disease) ([place])    MWF- [place]   . GERD (gastroesophageal reflux disease)   . Glaucoma   . HLD (hyperlipidemia)   . Hypertension   . Seasonal allergies   . Wears glasses   . Wears partial dentures     FH:  Non-Contributory  Social HX Social History   Tobacco Use  . Smoking status: Former Smoker    Years: 2.00    Quit date: 1987    Years since quitting: 34.7  . Smokeless tobacco: Never Used  Vaping Use  . Vaping Use: Never used  Substance Use Topics  . Alcohol use: Not Currently  . Drug use: No    Allergies Allergies  Allergen Reactions  . Ace Inhibitors Swelling and Other (See Comments)  . Enalapril Swelling    ANGIOEDEMA of lips  . Iron Sucrose Itching  . Venofer  [Ferric Oxide] Itching    Medications Current Facility-Administered Medications  Medication Dose Route Frequency Provider Last Rate Last Admin  . 0.9 %  sodium chloride infusion   Intravenous Continuous Urbano Milhouse E, MD      . ceFAZolin (ANCEF) IVPB 2g/100 mL premix  2 g Intravenous 30 min Pre-Op Oliviana Mcgahee, Jessy Oto, MD      . chlorhexidine (HIBICLENS) 4 % liquid 4 application  60 mL Topical Once Elam Dutch, MD       And  . Derrill Memo ON 06/29/2020] chlorhexidine (HIBICLENS) 4 % liquid 4 application  60 mL Topical Once Elam Dutch, MD      . famotidine (PEPCID) 20 MG tablet           . sodium citrate-citric acid (ORACIT) 500-334 MG/5ML solution              PHYSICAL EXAM  Vitals:   06/28/20 0626  BP: 139/68  Pulse: 85  Resp: 18  Temp: 98.3 F (36.8 C)  SpO2: 100%    General:  WDWN in NAD HENT: WNL Eyes: Pupils equal Pulmonary: normal non-labored breathing , without Rales, rhonchi,  wheezing Cardiac: RRR, Vascular  Exam/Pulses:  1+ right radial, right antecub scar, left arm grafts occluded.  Extremities without ischemic changes, no Gangrene , no cellulitis; no open wounds;  Neuro A&O x 3; good sensation; motion in all extremities  Impression: Needs HD access  Plan: Right upper arm graft today.  Risk benefits complications discussed.  Steal 3-5%, thrombosis 25% one year, infection bleeding other procedures possibly down the road  [employer] @TODAY @ 7:19 AM

## 2020-06-28 NOTE — Transfer of Care (Signed)
Immediate Anesthesia Transfer of Care Note  Patient: Jesus Ayala  Procedure(s) Performed: INSERTION OF RIGHT UPPER ARM ARTERIOVENOUS (AV) GORE-TEX GRAFT, Ligation of Right Brachial/Cephalic artery. (Right Arm Upper)  Patient Location: PACU  Anesthesia Type:General  Level of Consciousness: awake, patient cooperative and responds to stimulation  Airway & Oxygen Therapy: Patient Spontanous Breathing and Patient connected to face mask oxygen  Post-op Assessment: Report given to RN and Post -op Vital signs reviewed and stable  Post vital signs: Reviewed and stable  Last Vitals:  Vitals Value Taken Time  BP 134/74 06/28/20 0932  Temp    Pulse 98 06/28/20 0934  Resp 13 06/28/20 0934  SpO2 100 % 06/28/20 0934  Vitals shown include unvalidated device data.  Last Pain:  Vitals:   06/28/20 0648  TempSrc:   PainSc: 4       Patients Stated Pain Goal: 2 (87/18/36 7255)  Complications: No complications documented.

## 2020-06-29 ENCOUNTER — Encounter (HOSPITAL_COMMUNITY): Payer: Self-pay | Admitting: Vascular Surgery

## 2020-07-01 DIAGNOSIS — I10 Essential (primary) hypertension: Secondary | ICD-10-CM | POA: Diagnosis not present

## 2020-07-01 DIAGNOSIS — R079 Chest pain, unspecified: Secondary | ICD-10-CM | POA: Diagnosis not present

## 2020-07-01 DIAGNOSIS — Z87891 Personal history of nicotine dependence: Secondary | ICD-10-CM | POA: Diagnosis not present

## 2020-07-01 DIAGNOSIS — L988 Other specified disorders of the skin and subcutaneous tissue: Secondary | ICD-10-CM | POA: Diagnosis not present

## 2020-07-01 DIAGNOSIS — R05 Cough: Secondary | ICD-10-CM | POA: Diagnosis not present

## 2020-07-01 DIAGNOSIS — Z992 Dependence on renal dialysis: Secondary | ICD-10-CM | POA: Diagnosis not present

## 2020-07-01 DIAGNOSIS — R0789 Other chest pain: Secondary | ICD-10-CM | POA: Diagnosis not present

## 2020-07-01 DIAGNOSIS — N186 End stage renal disease: Secondary | ICD-10-CM | POA: Diagnosis not present

## 2020-07-06 DIAGNOSIS — N186 End stage renal disease: Secondary | ICD-10-CM | POA: Diagnosis not present

## 2020-07-06 DIAGNOSIS — D509 Iron deficiency anemia, unspecified: Secondary | ICD-10-CM | POA: Diagnosis not present

## 2020-07-06 DIAGNOSIS — N2581 Secondary hyperparathyroidism of renal origin: Secondary | ICD-10-CM | POA: Diagnosis not present

## 2020-07-06 DIAGNOSIS — T8249XD Other complication of vascular dialysis catheter, subsequent encounter: Secondary | ICD-10-CM | POA: Diagnosis not present

## 2020-07-06 DIAGNOSIS — Z992 Dependence on renal dialysis: Secondary | ICD-10-CM | POA: Diagnosis not present

## 2020-07-06 DIAGNOSIS — T782XXA Anaphylactic shock, unspecified, initial encounter: Secondary | ICD-10-CM | POA: Diagnosis not present

## 2020-07-06 DIAGNOSIS — D689 Coagulation defect, unspecified: Secondary | ICD-10-CM | POA: Diagnosis not present

## 2020-07-06 DIAGNOSIS — D631 Anemia in chronic kidney disease: Secondary | ICD-10-CM | POA: Diagnosis not present

## 2020-07-06 DIAGNOSIS — L299 Pruritus, unspecified: Secondary | ICD-10-CM | POA: Diagnosis not present

## 2020-07-06 DIAGNOSIS — Z23 Encounter for immunization: Secondary | ICD-10-CM | POA: Diagnosis not present

## 2020-07-11 ENCOUNTER — Encounter: Payer: Self-pay | Admitting: Nephrology

## 2020-07-12 DIAGNOSIS — K219 Gastro-esophageal reflux disease without esophagitis: Secondary | ICD-10-CM | POA: Insufficient documentation

## 2020-07-12 DIAGNOSIS — J3089 Other allergic rhinitis: Secondary | ICD-10-CM | POA: Diagnosis not present

## 2020-07-19 ENCOUNTER — Other Ambulatory Visit: Payer: Self-pay

## 2020-07-19 ENCOUNTER — Ambulatory Visit (INDEPENDENT_AMBULATORY_CARE_PROVIDER_SITE_OTHER): Payer: Self-pay | Admitting: Physician Assistant

## 2020-07-19 VITALS — BP 131/77 | HR 92 | Temp 97.6°F | Resp 20 | Ht 71.0 in | Wt 176.1 lb

## 2020-07-19 DIAGNOSIS — N186 End stage renal disease: Secondary | ICD-10-CM

## 2020-07-19 DIAGNOSIS — Z992 Dependence on renal dialysis: Secondary | ICD-10-CM

## 2020-07-19 NOTE — Progress Notes (Signed)
POST OPERATIVE OFFICE NOTE    CC:  F/u for surgery  HPI:  This is a 69 y.o. male who is s/p Right upper arm AV graft, ligation right brachial cephalic AV fistula on 01/24/8184 by Dr. Oneida Alar.  He is on HD via catheter currently on  MWF.  He denise pain and loss of motor.  He does have decreased sensation in the right hand, but it Is tolerable.   Pt returns today for follow up.    Allergies  Allergen Reactions  . Ace Inhibitors Swelling and Other (See Comments)  . Enalapril Swelling    ANGIOEDEMA of lips  . Iron Sucrose Itching  . Venofer  [Ferric Oxide] Itching    Current Outpatient Medications  Medication Sig Dispense Refill  . acetaminophen (TYLENOL) 500 MG tablet Take 1,000 mg by mouth every 6 (six) hours as needed (pain).     [name] aspirin 81 MG chewable tablet Chew 81 mg by mouth every other day.     . B Complex-C-Zn-Folic Acid (DIALYVITE 631-SHFW 15 PO) Take 1 tablet by mouth daily at 3 pm.    . calcium acetate (PHOSLO) 667 MG capsule Take 1 capsule (667 mg total) by mouth 3 (three) times daily with meals. (Patient taking differently: Take 667 mg by mouth See admin instructions. Take 667 mg with each meal and snack) 90 capsule 0  . diphenhydrAMINE (BENADRYL) 25 MG tablet Take 25 mg by mouth every other day. Allergies/ itching    . Ensure (ENSURE) Take 237 mLs by mouth daily as needed (supplement).    . famotidine (PEPCID) 20 MG tablet Take 1 tablet (20 mg total) by mouth 2 (two) times daily. 30 tablet 0  . fluticasone (FLONASE) 50 MCG/ACT nasal spray Place 1 spray into both nostrils daily as needed for allergies.     [name] gabapentin (NEURONTIN) 300 MG capsule Take 300 mg by mouth 2 (two) times daily as needed (pain).     . hydrALAZINE (APRESOLINE) 25 MG tablet Take 1 tablet (25 mg total) by mouth 3 (three) times daily. (Patient not taking: Reported on 05/26/2020) 90 tablet 0  . HYDROcodone-acetaminophen (NORCO) 5-325 MG tablet Take 1 tablet by mouth every 6 (six) hours as needed for  moderate pain. 15 tablet 0  . hydrocortisone cream 0.5 % Apply 1 application topically daily as needed for itching.    . latanoprost (XALATAN) 0.005 % ophthalmic solution Place 1 drop into both eyes at bedtime.    . lidocaine (LIDODERM) 5 % Place 1 patch onto the skin daily as needed (pain). Remove & Discard patch within 12 hours or as directed by MD    . lidocaine-prilocaine (EMLA) cream Apply 1 application topically as needed (port access).     . metoprolol succinate (TOPROL-XL) 50 MG 24 hr tablet Take 50 mg by mouth daily as needed. Take with or immediately following a meal.    . nitroGLYCERIN (NITROSTAT) 0.4 MG SL tablet Place 0.4 mg under the tongue every 5 (five) minutes as needed for chest pain.     . pantoprazole (PROTONIX) 20 MG tablet Take 1 tablet (20 mg total) by mouth daily. 30 tablet 0  . Phenol (CHLORASEPTIC MT) Use as directed 1 spray in the mouth or throat daily as needed (throat pain).    . sodium chloride (OCEAN) 0.65 % SOLN nasal spray Place 1 spray into both nostrils as needed for congestion.    . traZODone (DESYREL) 100 MG tablet Take 50 mg by mouth at bedtime.  Current Facility-Administered Medications  Medication Dose Route Frequency Provider Last Rate Last Admin  . 0.9 %  sodium chloride infusion  250 mL Intravenous PRN Elam Dutch, MD         ROS:  See HPI  Physical Exam:    Incision:  Well healed Extremities:  Grip 5/5 equal B, Palpable radial and graft thrill in the right UE Lungs : non labored breathing   Assessment/Plan:  This is a 69 y.o. male who is s/p:Right upper arm AV graft, ligation right brachial cephalic AV fistula  The graft may be accessed on 07/29/2020.  Once it is working well he can be scheduled for [facility] removal.    F/U PRN  Roxy Horseman PA-C [facility] [phone]  Clinic MD:  Carlis Abbott

## 2020-07-21 DIAGNOSIS — Z992 Dependence on renal dialysis: Secondary | ICD-10-CM | POA: Diagnosis not present

## 2020-07-21 DIAGNOSIS — I129 Hypertensive chronic kidney disease with stage 1 through stage 4 chronic kidney disease, or unspecified chronic kidney disease: Secondary | ICD-10-CM | POA: Diagnosis not present

## 2020-07-21 DIAGNOSIS — N186 End stage renal disease: Secondary | ICD-10-CM | POA: Diagnosis not present

## 2020-07-22 DIAGNOSIS — Z992 Dependence on renal dialysis: Secondary | ICD-10-CM | POA: Diagnosis not present

## 2020-07-22 DIAGNOSIS — N186 End stage renal disease: Secondary | ICD-10-CM | POA: Diagnosis not present

## 2020-07-22 DIAGNOSIS — D509 Iron deficiency anemia, unspecified: Secondary | ICD-10-CM | POA: Diagnosis not present

## 2020-07-22 DIAGNOSIS — T8249XD Other complication of vascular dialysis catheter, subsequent encounter: Secondary | ICD-10-CM | POA: Diagnosis not present

## 2020-07-22 DIAGNOSIS — L299 Pruritus, unspecified: Secondary | ICD-10-CM | POA: Diagnosis not present

## 2020-07-22 DIAGNOSIS — N2581 Secondary hyperparathyroidism of renal origin: Secondary | ICD-10-CM | POA: Diagnosis not present

## 2020-07-22 DIAGNOSIS — D689 Coagulation defect, unspecified: Secondary | ICD-10-CM | POA: Diagnosis not present

## 2020-07-22 DIAGNOSIS — D631 Anemia in chronic kidney disease: Secondary | ICD-10-CM | POA: Diagnosis not present

## 2020-08-08 DIAGNOSIS — L299 Pruritus, unspecified: Secondary | ICD-10-CM | POA: Diagnosis not present

## 2020-08-08 DIAGNOSIS — Z992 Dependence on renal dialysis: Secondary | ICD-10-CM | POA: Diagnosis not present

## 2020-08-08 DIAGNOSIS — D689 Coagulation defect, unspecified: Secondary | ICD-10-CM | POA: Diagnosis not present

## 2020-08-08 DIAGNOSIS — N186 End stage renal disease: Secondary | ICD-10-CM | POA: Diagnosis not present

## 2020-08-08 DIAGNOSIS — D631 Anemia in chronic kidney disease: Secondary | ICD-10-CM | POA: Diagnosis not present

## 2020-08-08 DIAGNOSIS — T8249XD Other complication of vascular dialysis catheter, subsequent encounter: Secondary | ICD-10-CM | POA: Diagnosis not present

## 2020-08-08 DIAGNOSIS — D509 Iron deficiency anemia, unspecified: Secondary | ICD-10-CM | POA: Diagnosis not present

## 2020-08-08 DIAGNOSIS — N2581 Secondary hyperparathyroidism of renal origin: Secondary | ICD-10-CM | POA: Diagnosis not present

## 2020-08-09 DIAGNOSIS — G629 Polyneuropathy, unspecified: Secondary | ICD-10-CM | POA: Diagnosis not present

## 2020-08-09 DIAGNOSIS — H547 Unspecified visual loss: Secondary | ICD-10-CM | POA: Diagnosis not present

## 2020-08-09 DIAGNOSIS — I120 Hypertensive chronic kidney disease with stage 5 chronic kidney disease or end stage renal disease: Secondary | ICD-10-CM | POA: Diagnosis not present

## 2020-08-09 DIAGNOSIS — N186 End stage renal disease: Secondary | ICD-10-CM | POA: Diagnosis not present

## 2020-08-09 DIAGNOSIS — K08409 Partial loss of teeth, unspecified cause, unspecified class: Secondary | ICD-10-CM | POA: Diagnosis not present

## 2020-08-09 DIAGNOSIS — Z992 Dependence on renal dialysis: Secondary | ICD-10-CM | POA: Diagnosis not present

## 2020-08-09 DIAGNOSIS — H409 Unspecified glaucoma: Secondary | ICD-10-CM | POA: Diagnosis not present

## 2020-08-09 DIAGNOSIS — K219 Gastro-esophageal reflux disease without esophagitis: Secondary | ICD-10-CM | POA: Diagnosis not present

## 2020-08-09 DIAGNOSIS — J309 Allergic rhinitis, unspecified: Secondary | ICD-10-CM | POA: Diagnosis not present

## 2020-08-09 DIAGNOSIS — G4700 Insomnia, unspecified: Secondary | ICD-10-CM | POA: Diagnosis not present

## 2020-08-16 DIAGNOSIS — Z452 Encounter for adjustment and management of vascular access device: Secondary | ICD-10-CM | POA: Diagnosis not present

## 2020-08-16 DIAGNOSIS — Z0189 Encounter for other specified special examinations: Secondary | ICD-10-CM | POA: Diagnosis not present

## 2020-08-16 DIAGNOSIS — W460XXA Contact with hypodermic needle, initial encounter: Secondary | ICD-10-CM | POA: Diagnosis not present

## 2020-08-16 DIAGNOSIS — Z7721 Contact with and (suspected) exposure to potentially hazardous body fluids: Secondary | ICD-10-CM | POA: Diagnosis not present

## 2020-08-21 DIAGNOSIS — N186 End stage renal disease: Secondary | ICD-10-CM | POA: Diagnosis not present

## 2020-08-21 DIAGNOSIS — Z992 Dependence on renal dialysis: Secondary | ICD-10-CM | POA: Diagnosis not present

## 2020-08-21 DIAGNOSIS — I129 Hypertensive chronic kidney disease with stage 1 through stage 4 chronic kidney disease, or unspecified chronic kidney disease: Secondary | ICD-10-CM | POA: Diagnosis not present

## 2020-08-22 DIAGNOSIS — N2581 Secondary hyperparathyroidism of renal origin: Secondary | ICD-10-CM | POA: Diagnosis not present

## 2020-08-22 DIAGNOSIS — N186 End stage renal disease: Secondary | ICD-10-CM | POA: Diagnosis not present

## 2020-08-22 DIAGNOSIS — Z992 Dependence on renal dialysis: Secondary | ICD-10-CM | POA: Diagnosis not present

## 2020-08-22 DIAGNOSIS — D689 Coagulation defect, unspecified: Secondary | ICD-10-CM | POA: Diagnosis not present

## 2020-08-22 DIAGNOSIS — D631 Anemia in chronic kidney disease: Secondary | ICD-10-CM | POA: Diagnosis not present

## 2020-08-24 DIAGNOSIS — D689 Coagulation defect, unspecified: Secondary | ICD-10-CM | POA: Diagnosis not present

## 2020-08-24 DIAGNOSIS — Z992 Dependence on renal dialysis: Secondary | ICD-10-CM | POA: Diagnosis not present

## 2020-08-24 DIAGNOSIS — N186 End stage renal disease: Secondary | ICD-10-CM | POA: Diagnosis not present

## 2020-08-24 DIAGNOSIS — N2581 Secondary hyperparathyroidism of renal origin: Secondary | ICD-10-CM | POA: Diagnosis not present

## 2020-08-24 DIAGNOSIS — D631 Anemia in chronic kidney disease: Secondary | ICD-10-CM | POA: Diagnosis not present

## 2020-08-26 DIAGNOSIS — Z992 Dependence on renal dialysis: Secondary | ICD-10-CM | POA: Diagnosis not present

## 2020-08-26 DIAGNOSIS — N186 End stage renal disease: Secondary | ICD-10-CM | POA: Diagnosis not present

## 2020-08-26 DIAGNOSIS — D631 Anemia in chronic kidney disease: Secondary | ICD-10-CM | POA: Diagnosis not present

## 2020-08-26 DIAGNOSIS — D689 Coagulation defect, unspecified: Secondary | ICD-10-CM | POA: Diagnosis not present

## 2020-08-26 DIAGNOSIS — N2581 Secondary hyperparathyroidism of renal origin: Secondary | ICD-10-CM | POA: Diagnosis not present

## 2020-08-29 ENCOUNTER — Emergency Department (HOSPITAL_COMMUNITY): Payer: Medicare HMO

## 2020-08-29 ENCOUNTER — Observation Stay (HOSPITAL_COMMUNITY)
Admission: EM | Admit: 2020-08-29 | Discharge: 2020-08-30 | DRG: 312 | Disposition: A | Discharge status: Home / Self Care | Payer: Medicare HMO | Source: Ambulatory Visit | Attending: Internal Medicine | Admitting: Internal Medicine

## 2020-08-29 ENCOUNTER — Encounter (HOSPITAL_COMMUNITY): Payer: Self-pay | Admitting: Emergency Medicine

## 2020-08-29 ENCOUNTER — Other Ambulatory Visit: Payer: Self-pay

## 2020-08-29 DIAGNOSIS — E785 Hyperlipidemia, unspecified: Secondary | ICD-10-CM | POA: Diagnosis present

## 2020-08-29 DIAGNOSIS — F1011 Alcohol abuse, in remission: Secondary | ICD-10-CM | POA: Diagnosis present

## 2020-08-29 DIAGNOSIS — Z20822 Contact with and (suspected) exposure to covid-19: Secondary | ICD-10-CM | POA: Diagnosis present

## 2020-08-29 DIAGNOSIS — D689 Coagulation defect, unspecified: Secondary | ICD-10-CM | POA: Diagnosis not present

## 2020-08-29 DIAGNOSIS — Z7982 Long term (current) use of aspirin: Secondary | ICD-10-CM | POA: Diagnosis not present

## 2020-08-29 DIAGNOSIS — R072 Precordial pain: Secondary | ICD-10-CM | POA: Diagnosis present

## 2020-08-29 DIAGNOSIS — Z79899 Other long term (current) drug therapy: Secondary | ICD-10-CM | POA: Diagnosis not present

## 2020-08-29 DIAGNOSIS — Z801 Family history of malignant neoplasm of trachea, bronchus and lung: Secondary | ICD-10-CM | POA: Diagnosis not present

## 2020-08-29 DIAGNOSIS — E869 Volume depletion, unspecified: Secondary | ICD-10-CM | POA: Diagnosis present

## 2020-08-29 DIAGNOSIS — I1311 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, with stage 5 chronic kidney disease, or end stage renal disease: Secondary | ICD-10-CM | POA: Diagnosis present

## 2020-08-29 DIAGNOSIS — E875 Hyperkalemia: Secondary | ICD-10-CM | POA: Diagnosis present

## 2020-08-29 DIAGNOSIS — F419 Anxiety disorder, unspecified: Secondary | ICD-10-CM | POA: Diagnosis present

## 2020-08-29 DIAGNOSIS — K219 Gastro-esophageal reflux disease without esophagitis: Secondary | ICD-10-CM | POA: Diagnosis present

## 2020-08-29 DIAGNOSIS — R002 Palpitations: Secondary | ICD-10-CM | POA: Diagnosis present

## 2020-08-29 DIAGNOSIS — R079 Chest pain, unspecified: Secondary | ICD-10-CM | POA: Insufficient documentation

## 2020-08-29 DIAGNOSIS — R55 Syncope and collapse: Principal | ICD-10-CM

## 2020-08-29 DIAGNOSIS — Z8249 Family history of ischemic heart disease and other diseases of the circulatory system: Secondary | ICD-10-CM | POA: Diagnosis not present

## 2020-08-29 DIAGNOSIS — Z888 Allergy status to other drugs, medicaments and biological substances status: Secondary | ICD-10-CM | POA: Diagnosis not present

## 2020-08-29 DIAGNOSIS — D631 Anemia in chronic kidney disease: Secondary | ICD-10-CM | POA: Diagnosis present

## 2020-08-29 DIAGNOSIS — H409 Unspecified glaucoma: Secondary | ICD-10-CM | POA: Diagnosis present

## 2020-08-29 DIAGNOSIS — N2581 Secondary hyperparathyroidism of renal origin: Secondary | ICD-10-CM | POA: Diagnosis not present

## 2020-08-29 DIAGNOSIS — I509 Heart failure, unspecified: Secondary | ICD-10-CM | POA: Insufficient documentation

## 2020-08-29 DIAGNOSIS — I132 Hypertensive heart and chronic kidney disease with heart failure and with stage 5 chronic kidney disease, or end stage renal disease: Secondary | ICD-10-CM | POA: Insufficient documentation

## 2020-08-29 DIAGNOSIS — M1990 Unspecified osteoarthritis, unspecified site: Secondary | ICD-10-CM | POA: Diagnosis present

## 2020-08-29 DIAGNOSIS — I444 Left anterior fascicular block: Secondary | ICD-10-CM | POA: Diagnosis present

## 2020-08-29 DIAGNOSIS — D649 Anemia, unspecified: Secondary | ICD-10-CM | POA: Diagnosis not present

## 2020-08-29 DIAGNOSIS — Z87891 Personal history of nicotine dependence: Secondary | ICD-10-CM

## 2020-08-29 DIAGNOSIS — H269 Unspecified cataract: Secondary | ICD-10-CM | POA: Diagnosis present

## 2020-08-29 DIAGNOSIS — I951 Orthostatic hypotension: Secondary | ICD-10-CM | POA: Diagnosis present

## 2020-08-29 DIAGNOSIS — Z992 Dependence on renal dialysis: Secondary | ICD-10-CM

## 2020-08-29 DIAGNOSIS — N186 End stage renal disease: Secondary | ICD-10-CM | POA: Diagnosis not present

## 2020-08-29 DIAGNOSIS — E213 Hyperparathyroidism, unspecified: Secondary | ICD-10-CM | POA: Diagnosis present

## 2020-08-29 LAB — CBC WITH DIFFERENTIAL/PLATELET
Abs Immature Granulocytes: 0.04 10*3/uL (ref 0.00–0.07)
Basophils Absolute: 0.1 10*3/uL (ref 0.0–0.1)
Basophils Relative: 1 %
Eosinophils Absolute: 0.1 10*3/uL (ref 0.0–0.5)
Eosinophils Relative: 2 %
HCT: 47.3 % (ref 39.0–52.0)
Hemoglobin: 14.5 g/dL (ref 13.0–17.0)
Immature Granulocytes: 1 %
Lymphocytes Relative: 14 %
Lymphs Abs: 1 10*3/uL (ref 0.7–4.0)
MCH: 29.5 pg (ref 26.0–34.0)
MCHC: 30.7 g/dL (ref 30.0–36.0)
MCV: 96.3 fL (ref 80.0–100.0)
Monocytes Absolute: 0.5 10*3/uL (ref 0.1–1.0)
Monocytes Relative: 7 %
Neutro Abs: 5.4 10*3/uL (ref 1.7–7.7)
Neutrophils Relative %: 75 %
Platelets: 150 10*3/uL (ref 150–400)
RBC: 4.91 MIL/uL (ref 4.22–5.81)
RDW: 14.6 % (ref 11.5–15.5)
WBC: 7.2 10*3/uL (ref 4.0–10.5)
nRBC: 0 % (ref 0.0–0.2)

## 2020-08-29 LAB — HIV ANTIBODY (ROUTINE TESTING W REFLEX): HIV Screen 4th Generation wRfx: NONREACTIVE

## 2020-08-29 LAB — RESPIRATORY PANEL BY RT PCR (FLU A&B, COVID)
Influenza A by PCR: NEGATIVE
Influenza B by PCR: NEGATIVE
SARS Coronavirus 2 by RT PCR: NEGATIVE

## 2020-08-29 LAB — TROPONIN I (HIGH SENSITIVITY)
Troponin I (High Sensitivity): 6 ng/L (ref <18)
Troponin I (High Sensitivity): 5 ng/L (ref <18)

## 2020-08-29 LAB — BASIC METABOLIC PANEL
Anion gap: 14 (ref 5–15)
BUN: 26 mg/dL — ABNORMAL HIGH (ref 8–23)
CO2: 31 mmol/L (ref 22–32)
Calcium: 10.5 mg/dL — ABNORMAL HIGH (ref 8.9–10.3)
Chloride: 91 mmol/L — ABNORMAL LOW (ref 98–111)
Creatinine, Ser: 9.32 mg/dL — ABNORMAL HIGH (ref 0.61–1.24)
GFR, Estimated: 6 mL/min — ABNORMAL LOW (ref >60)
Glucose, Bld: 102 mg/dL — ABNORMAL HIGH (ref 70–99)
Potassium: 6.3 mmol/L (ref 3.5–5.1)
Sodium: 136 mmol/L (ref 135–145)

## 2020-08-29 LAB — POTASSIUM: Potassium: 4.4 mmol/L (ref 3.5–5.1)

## 2020-08-29 LAB — PHOSPHORUS: Phosphorus: 3 mg/dL (ref 2.5–4.6)

## 2020-08-29 LAB — MAGNESIUM: Magnesium: 2.3 mg/dL (ref 1.7–2.4)

## 2020-08-29 MED ORDER — ASPIRIN 81 MG PO CHEW
81.0000 mg | CHEWABLE_TABLET | ORAL | Status: DC
  Administered 2020-08-29: 81 mg via ORAL
  Filled 2020-08-29: qty 1

## 2020-08-29 MED ORDER — FLUTICASONE PROPIONATE 50 MCG/ACT NA SUSP
1.0000 | Freq: Every day | NASAL | Status: DC | PRN
  Filled 2020-08-29: qty 16

## 2020-08-29 MED ORDER — SODIUM ZIRCONIUM CYCLOSILICATE 10 G PO PACK: 10.0000 g | PACK | Freq: Four times a day (QID) | ORAL | Status: DC

## 2020-08-29 MED ORDER — PANTOPRAZOLE SODIUM 40 MG PO TBEC
40.0000 mg | DELAYED_RELEASE_TABLET | Freq: Every day | ORAL | Status: DC
  Administered 2020-08-29 – 2020-08-30 (×2): 40 mg via ORAL
  Filled 2020-08-29 (×2): qty 1

## 2020-08-29 MED ORDER — GABAPENTIN 300 MG PO CAPS
300.0000 mg | ORAL_CAPSULE | Freq: Two times a day (BID) | ORAL | Status: DC | PRN
  Administered 2020-08-29: 300 mg via ORAL
  Filled 2020-08-29: qty 1

## 2020-08-29 MED ORDER — TRAZODONE HCL 50 MG PO TABS
100.0000 mg | ORAL_TABLET | Freq: Every day | ORAL | Status: DC
  Administered 2020-08-29: 100 mg via ORAL
  Filled 2020-08-29: qty 2

## 2020-08-29 MED ORDER — DIPHENHYDRAMINE HCL 25 MG PO CAPS: 25.0000 mg | ORAL_CAPSULE | ORAL | Status: DC

## 2020-08-29 MED ORDER — SODIUM CHLORIDE 0.9% FLUSH
3.0000 mL | Freq: Two times a day (BID) | INTRAVENOUS | Status: DC
  Administered 2020-08-30: 3 mL via INTRAVENOUS

## 2020-08-29 MED ORDER — ACETAMINOPHEN 500 MG PO TABS: 1000.0000 mg | ORAL_TABLET | Freq: Four times a day (QID) | ORAL | Status: DC | PRN

## 2020-08-29 MED ORDER — FAMOTIDINE 20 MG PO TABS
20.0000 mg | ORAL_TABLET | Freq: Every day | ORAL | Status: DC
  Administered 2020-08-29: 20 mg via ORAL
  Filled 2020-08-29: qty 1

## 2020-08-29 MED ORDER — SODIUM ZIRCONIUM CYCLOSILICATE 10 G PO PACK: 10.0000 g | PACK | Freq: Once | ORAL | Status: DC

## 2020-08-29 MED ORDER — CALCIUM ACETATE (PHOS BINDER) 667 MG PO CAPS
667.0000 mg | ORAL_CAPSULE | Freq: Three times a day (TID) | ORAL | Status: DC
  Administered 2020-08-30 (×2): 667 mg via ORAL
  Filled 2020-08-29 (×3): qty 1

## 2020-08-29 MED ORDER — HEPARIN SODIUM (PORCINE) 5000 UNIT/ML IJ SOLN
5000.0000 [IU] | Freq: Two times a day (BID) | INTRAMUSCULAR | Status: DC
  Administered 2020-08-29: 5000 [IU] via SUBCUTANEOUS
  Filled 2020-08-29 (×2): qty 1

## 2020-08-29 MED ORDER — SALINE SPRAY 0.65 % NA SOLN
1.0000 | NASAL | Status: DC | PRN
  Filled 2020-08-29: qty 44

## 2020-08-29 NOTE — ED Notes (Signed)
Repeat potassium sent to lab. Orders to give lokelma if potassium is still elevated.

## 2020-08-29 NOTE — ED Notes (Signed)
Sandwich given to pt.

## 2020-08-29 NOTE — ED Provider Notes (Signed)
  Physical Exam  BP 111/82   Pulse 100   Temp (!) 97.5 F (36.4 C) (Oral)   Resp (!) 22   Ht 5\' 11"  (1.803 m)   Wt 78 kg   SpO2 98%   BMI 23.99 kg/m   Physical Exam  ED Course/Procedures     Procedures  MDM  Care of patient assumed from Dr. Ottis Stain at 1500.  Patient is a 69 year old man, history of ESRD on Monday Wednesday Friday dialysis, who presents with an episode of chest pain and syncope from dialysis.  They are taken off some extra fluid today for unclear reasons.  After his session, he had some chest pressure and felt dizzy, so due to the chest pressure he took his nitroglycerin and then syncopized.  He has been hemodynamically stable since.  He is pending labs.  Labs remarkable for potassium of 6.3 without hemolysis.  Consulted nephrology, who recommended monitoring of the potassium and doses of Lokelma now and in 6 hours.  Consulted hospitalist for observation admission for trending of his potassium and Lokelma administration.  Patient informed of plan and patient in agreement.  Patient accepted to hospitalist service without further incident while under my care.  The care of this patient was overseen by Dr. Laverta Baltimore, ED attending, who agreed with evaluation and management of the patient.       Launa Flight, MD 08/29/20 1629    Margette Fast, MD 09/01/20 [phone]

## 2020-08-29 NOTE — ED Notes (Signed)
Stands on own ability for Ortho VS. No complaints. Steady on feet.

## 2020-08-29 NOTE — ED Notes (Signed)
Vital signs stable. 

## 2020-08-29 NOTE — ED Triage Notes (Signed)
Pt arrives via EMS from HD after complete treatment. After HD, pt began to have slight chest discomfort and took 2 nitro at once, then had a syncopal episode. For EMS pt was A&Ox4 on arrival but had a second syncopal episode when standing.

## 2020-08-29 NOTE — H&P (Signed)
History and Physical    Jesus Ayala XBM:[id number] DOB: April 04, 1951 DOA: 08/29/2020  PCP: Seward Carol, PA-C (Confirm with patient/family/NH records and if not entered, this has to be entered at [facility] point of entry) Patient coming from: Home  I have personally briefly reviewed patient's old medical records in [place]  Chief Complaint: Lightheaded  HPI: Jesus Ayala is a 69 y.o. male with medical history significant of ESRD on HD Monday Wednesday Friday, HLD, GERD, presented with syncope. Right after this afternoon's HD session, patient started to feel lightheadedness when he tried to stand up, he said he also had feeling of palpitation versus sharp retrosternal chest pain he could not remember in detail, in any case he lost consciousness a few seconds not hitting head or any part of the body. No postictal confusion.  Patient otherwise has been healthy, denies any cough no fever chills no diarrhea. He visited his PCP recently for routine follow-up and is recommended for a stress test which was scheduled in 2 weeks.  ED Course: ED found patient blood pressure borderline in the lower 100s, blood work showed potassium 6.3. No EKG changes, repeated potassium 4.4. Patient chest pain-free.  Review of Systems: As per HPI otherwise 14 point review of systems negative.    Past Medical History:  Diagnosis Date  . Anemia   . Anxiety    situational  . Arthritis   . Early cataracts, bilateral   . ESRD (end stage renal disease) ([place])    MWF- [place]  . GERD (gastroesophageal reflux disease)   . Glaucoma   . HLD (hyperlipidemia)   . Hypertension   . Seasonal allergies   . Wears glasses   . Wears partial dentures     Past Surgical History:  Procedure Laterality Date  . A/V FISTULAGRAM Right 06/03/2020   Procedure: A/V FISTULAGRAM;  Surgeon: Elam Dutch, MD;  Location: [facility];  Service: Cardiovascular;  Laterality: Right;  . AV FISTULA  PLACEMENT Left 07/14/2019   Procedure: INSERTION OF ARTERIOVENOUS (AV) GORE-TEX GRAFT ARM;  Surgeon: Angelia Mould, MD;  Location: [facility] OR;  Service: Vascular;  Laterality: Left;  . AV FISTULA PLACEMENT Right 02/23/2020   Procedure: RIGHT ARM ARTERIOVENOUS (AV) FISTULA CREATION;  Surgeon: Elam Dutch, MD;  Location: [facility] OR;  Service: Vascular;  Laterality: Right;  . AV FISTULA PLACEMENT Right 06/28/2020   Procedure: INSERTION OF RIGHT UPPER ARM ARTERIOVENOUS (AV) GORE-TEX GRAFT, Ligation of Right Brachial/Cephalic artery.;  Surgeon: Elam Dutch, MD;  Location: [facility];  Service: Vascular;  Laterality: Right;  . COLONOSCOPY    . HERNIA REPAIR     unbicial  . INSERTION OF DIALYSIS CATHETER Right 06/25/2019   Procedure: INSERTION OF TUNNEL DIALYSIS CATHETER;  Surgeon: Waynetta Sandy, MD;  Location: [facility];  Service: Vascular;  Laterality: Right;  . IR THROMBECTOMY AV FISTULA W/THROMBOLYSIS/PTA INC/SHUNT/IMG LEFT Left 09/14/2019  . IR US GUIDE VASC ACCESS LEFT  09/14/2019  . MULTIPLE TOOTH EXTRACTIONS    . TEE WITHOUT CARDIOVERSION N/A 08/31/2019   Procedure: TRANSESOPHAGEAL ECHOCARDIOGRAM (TEE);  Surgeon: Pixie Casino, MD;  Location: [facility] ENDOSCOPY;  Service: Cardiovascular;  Laterality: N/A;     reports that he quit smoking about 34 years ago. He quit after 2.00 years of use. He has never used smokeless tobacco. He reports previous alcohol use. He reports that he does not use drugs.  Allergies  Allergen Reactions  . Ace Inhibitors Swelling and Other (See Comments)  .  Enalapril Swelling    ANGIOEDEMA of lips  . Iron Sucrose Itching  . Venofer  [Ferric Oxide] Itching    Family History  Problem Relation Age of Onset  . Lung cancer Father   . Hypertension Brother      Prior to Admission medications   Medication Sig Start Date End Date Taking? Authorizing Provider  acetaminophen (TYLENOL) 500 MG tablet Take 1,000 mg by mouth every 6 (six) hours as needed (pain).     Yes [provider]  aspirin 81 MG chewable tablet Chew 81 mg by mouth every other day.    Yes [provider]  B Complex-C-Zn-Folic Acid (DIALYVITE 096-EAVW 15 PO) Take 1 tablet by mouth daily at 3 pm.   Yes [provider]  calcium acetate (PHOSLO) 667 MG capsule Take 1 capsule (667 mg total) by mouth 3 (three) times daily with meals. Patient taking differently: Take 667 mg by mouth See admin instructions. Take 667 mg with each meal and snack 04/22/19  Yes Lavina Hamman, MD  diphenhydrAMINE (BENADRYL) 25 MG tablet Take 25 mg by mouth every other day. Allergies/ itching   Yes [provider]  Ensure (ENSURE) Take 237 mLs by mouth daily as needed (supplement).   Yes [provider]  famotidine (PEPCID) 20 MG tablet Take 1 tablet (20 mg total) by mouth 2 (two) times daily. 06/08/20  Yes Nuala Alpha A, PA-C  fluticasone (FLONASE) 50 MCG/ACT nasal spray Place 1 spray into both nostrils daily as needed for allergies.  01/14/20  Yes [provider]  gabapentin (NEURONTIN) 300 MG capsule Take 300 mg by mouth 2 (two) times daily as needed (pain).  01/14/20  Yes [provider]  hydrocortisone cream 0.5 % Apply 1 application topically daily as needed for itching.   Yes [provider]  latanoprost (XALATAN) 0.005 % ophthalmic solution Place 1 drop into both eyes at bedtime.   Yes [provider]  lidocaine-prilocaine (EMLA) cream Apply 1 application topically as needed (port access).    Yes [provider]  nitroGLYCERIN (NITROSTAT) 0.4 MG SL tablet Place 0.4 mg under the tongue every 5 (five) minutes as needed for chest pain.  03/27/19  Yes [provider]  pantoprazole (PROTONIX) 40 MG tablet Take 40 mg by mouth daily.   Yes [provider]  sodium chloride (OCEAN) 0.65 % SOLN nasal spray Place 1 spray into both nostrils as needed for congestion.   Yes [provider]  traZODone (DESYREL)  100 MG tablet Take 100 mg by mouth at bedtime.  04/02/20  Yes [provider]  hydrALAZINE (APRESOLINE) 25 MG tablet Take 1 tablet (25 mg total) by mouth 3 (three) times daily. Patient not taking: Reported on 08/29/2020 04/22/19   Lavina Hamman, MD  HYDROcodone-acetaminophen [facility]) 5-325 MG tablet Take 1 tablet by mouth every 6 (six) hours as needed for moderate pain. Patient not taking: Reported on 08/29/2020 06/28/20   Dagoberto Ligas, PA-C  pantoprazole (PROTONIX) 20 MG tablet Take 1 tablet (20 mg total) by mouth daily. Patient not taking: Reported on 08/29/2020 06/08/20   Deliah Boston, PA-C    Physical Exam: Vitals:   08/29/20 1600 08/29/20 1615 08/29/20 1645 08/29/20 1700  BP: 113/71 122/82 112/79 118/79  Pulse: 100 (!) 104 98 (!) 101  Resp: 18 18 19 17   Temp:      TempSrc:      SpO2: 98% 98% 99% 95%  Weight:      Height:  Constitutional: NAD, calm, comfortable Vitals:   08/29/20 1600 08/29/20 1615 08/29/20 1645 08/29/20 1700  BP: 113/71 122/82 112/79 118/79  Pulse: 100 (!) 104 98 (!) 101  Resp: 18 18 19 17   Temp:      TempSrc:      SpO2: 98% 98% 99% 95%  Weight:      Height:       Eyes: PERRL, lids and conjunctivae normal ENMT: Mucous membranes are dry. Posterior pharynx clear of any exudate or lesions.Normal dentition.  Neck: normal, supple, no masses, no thyromegaly Respiratory: clear to auscultation bilaterally, no wheezing, no crackles. Normal respiratory effort. No accessory muscle use.  Cardiovascular: Regular rate and rhythm, no murmurs / rubs / gallops. No extremity edema. 2+ pedal pulses. No carotid bruits.  Abdomen: no tenderness, no masses palpated. No hepatosplenomegaly. Bowel sounds positive.  Musculoskeletal: no clubbing / cyanosis. No joint deformity upper and lower extremities. Good ROM, no contractures. Normal muscle tone.  Skin: no rashes, lesions, ulcers. No induration Neurologic: CN 2-12 grossly intact. Sensation intact, DTR normal.  Strength 5/5 in all 4.  Psychiatric: Normal judgment and insight. Alert and oriented x 3. Normal mood.     Labs on Admission: I have personally reviewed following labs and imaging studies  CBC: Recent Labs  Lab 08/29/20 1319  WBC 7.2  NEUTROABS 5.4  HGB 14.5  HCT 47.3  MCV 96.3  PLT 741   Basic Metabolic Panel: Recent Labs  Lab 08/29/20 1410 08/29/20 1603  NA 136  --   K 6.3* 4.4  CL 91*  --   CO2 31  --   GLUCOSE 102*  --   BUN 26*  --   CREATININE 9.32*  --   CALCIUM 10.5*  --   MG 2.3  --   PHOS 3.0  --    GFR: Estimated Creatinine Clearance: 8.1 mL/min (A) (by C-G formula based on SCr of 9.32 mg/dL (H)). Liver Function Tests: No results for input(s): AST, ALT, ALKPHOS, BILITOT, PROT, ALBUMIN in the last 168 hours. No results for input(s): LIPASE, AMYLASE in the last 168 hours. No results for input(s): AMMONIA in the last 168 hours. Coagulation Profile: No results for input(s): INR, PROTIME in the last 168 hours. Cardiac Enzymes: No results for input(s): CKTOTAL, CKMB, CKMBINDEX, TROPONINI in the last 168 hours. BNP (last 3 results) No results for input(s): PROBNP in the last 8760 hours. HbA1C: No results for input(s): HGBA1C in the last 72 hours. CBG: No results for input(s): GLUCAP in the last 168 hours. Lipid Profile: No results for input(s): CHOL, HDL, LDLCALC, TRIG, CHOLHDL, LDLDIRECT in the last 72 hours. Thyroid Function Tests: No results for input(s): TSH, T4TOTAL, FREET4, T3FREE, THYROIDAB in the last 72 hours. Anemia Panel: No results for input(s): VITAMINB12, FOLATE, FERRITIN, TIBC, IRON, RETICCTPCT in the last 72 hours. Urine analysis:    Component Value Date/Time   COLORURINE YELLOW 08/25/2019 1540   APPEARANCEUR CLEAR 08/25/2019 1540   LABSPEC 1.011 08/25/2019 1540   PHURINE 8.0 08/25/2019 1540   GLUCOSEU NEGATIVE 08/25/2019 1540   HGBUR NEGATIVE 08/25/2019 1540   BILIRUBINUR NEGATIVE 08/25/2019 1540   KETONESUR NEGATIVE 08/25/2019  1540   PROTEINUR >=300 (A) 08/25/2019 1540   UROBILINOGEN 1.0 12/12/2009 0201   NITRITE NEGATIVE 08/25/2019 1540   LEUKOCYTESUR NEGATIVE 08/25/2019 1540    Radiological Exams on Admission: DG Chest Portable 1 View  Result Date: 08/29/2020 CLINICAL DATA:  Chest pain.  Syncopal episode. EXAM: PORTABLE CHEST 1 VIEW COMPARISON:  07/01/2020.  FINDINGS: The heart size and mediastinal contours are within normal limits. Both lungs are clear. No visible pleural effusions or pneumothorax. The visualized skeletal structures are unremarkable. IMPRESSION: No acute cardiopulmonary disease. Electronically Signed   By: Margaretha Sheffield MD   On: 08/29/2020 12:24    EKG: Independently reviewed. Normal sinus rhythm no acute ST-T changes  Assessment/Plan Active Problems:   Syncope  (please populate well all problems here in Problem List. (For example, if patient is on BP meds at home and you resume or decide to hold them, it is a problem that needs to be her. Same for CAD, COPD, HLD and so on)  Syncope -Likely vasovagal, clinically looks like patient is volume depleted. However given his kidney function status and dialysis status, recommend no IV fluid but encourage copious p.o. fluid intake. -Orthostatic vital signs -Echo  Chest pain -ACS ruled out. Patient not sure whether this is a palpitation versus real chest pain. Follow-up echo, if no regional wall motion abnormalities, can go to the scheduled stress test in 2 weeks.  ESRD -Hyperkalemia looks like lab error, no treatment indicated -Has hypercalcemia likely secondary to ESRD and possible hyperparathyroidism, repeat BMP and follow-up with nephrology as outpatient.  GERD -Continue PPI  DVT prophylaxis: Heparin subcu Code Status: Full code Family Communication: None at bedside Disposition Plan: Can be discharged home tomorrow after syncope work-up. Consults called: Nephrology Admission status: Telemetry observation   Lequita Halt MD [facility] Pager [phone]  08/29/2020, 5:50 PM

## 2020-08-29 NOTE — Progress Notes (Signed)
Nephrology quick note:   ED called nephrology - pt completed HD treatment today per usual outpt MWF schedule at [facility].  Following procedure he had syncopal episode so he was sent to ED via EMS.  Per ED provider he's been hemodynamically stable with no apparent issues. EKG with nonspecific chronic changes.  Labs showing K 6.3, bicarb 31, ca 10.5, Hb 14.5, WBC 7.2.  Reviewed outpt dialysis records -- generally K is well controlled.  Recheck K now to r/o lab error given this is not a typical issue for him.  If repeat is persistently high treat with PO lokelma.   I think an obs stay overnight is reasonable to sort out these issues.   If pt remains in the hospital tomorrow we can see him.   If he's stable and labs look ok in the AM he can be discharge to attend outpt dialysis on Wed.
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
## 2020-08-30 ENCOUNTER — Inpatient Hospital Stay (HOSPITAL_COMMUNITY): Payer: Medicare HMO

## 2020-08-30 DIAGNOSIS — R002 Palpitations: Secondary | ICD-10-CM | POA: Diagnosis present

## 2020-08-30 DIAGNOSIS — R079 Chest pain, unspecified: Secondary | ICD-10-CM

## 2020-08-30 DIAGNOSIS — I444 Left anterior fascicular block: Secondary | ICD-10-CM | POA: Diagnosis present

## 2020-08-30 DIAGNOSIS — Z20822 Contact with and (suspected) exposure to covid-19: Secondary | ICD-10-CM | POA: Diagnosis present

## 2020-08-30 DIAGNOSIS — Z79899 Other long term (current) drug therapy: Secondary | ICD-10-CM | POA: Diagnosis not present

## 2020-08-30 DIAGNOSIS — F1011 Alcohol abuse, in remission: Secondary | ICD-10-CM | POA: Diagnosis present

## 2020-08-30 DIAGNOSIS — I1311 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, with stage 5 chronic kidney disease, or end stage renal disease: Secondary | ICD-10-CM | POA: Diagnosis not present

## 2020-08-30 DIAGNOSIS — Z7982 Long term (current) use of aspirin: Secondary | ICD-10-CM | POA: Diagnosis not present

## 2020-08-30 DIAGNOSIS — R072 Precordial pain: Secondary | ICD-10-CM | POA: Diagnosis present

## 2020-08-30 DIAGNOSIS — Z801 Family history of malignant neoplasm of trachea, bronchus and lung: Secondary | ICD-10-CM | POA: Diagnosis not present

## 2020-08-30 DIAGNOSIS — R55 Syncope and collapse: Secondary | ICD-10-CM

## 2020-08-30 DIAGNOSIS — F419 Anxiety disorder, unspecified: Secondary | ICD-10-CM | POA: Diagnosis present

## 2020-08-30 DIAGNOSIS — Z992 Dependence on renal dialysis: Secondary | ICD-10-CM | POA: Diagnosis not present

## 2020-08-30 DIAGNOSIS — K219 Gastro-esophageal reflux disease without esophagitis: Secondary | ICD-10-CM | POA: Diagnosis present

## 2020-08-30 DIAGNOSIS — E875 Hyperkalemia: Secondary | ICD-10-CM | POA: Diagnosis present

## 2020-08-30 DIAGNOSIS — Z888 Allergy status to other drugs, medicaments and biological substances status: Secondary | ICD-10-CM | POA: Diagnosis not present

## 2020-08-30 DIAGNOSIS — E869 Volume depletion, unspecified: Secondary | ICD-10-CM | POA: Diagnosis present

## 2020-08-30 DIAGNOSIS — N186 End stage renal disease: Secondary | ICD-10-CM

## 2020-08-30 DIAGNOSIS — Z87891 Personal history of nicotine dependence: Secondary | ICD-10-CM | POA: Diagnosis not present

## 2020-08-30 DIAGNOSIS — D631 Anemia in chronic kidney disease: Secondary | ICD-10-CM | POA: Diagnosis present

## 2020-08-30 DIAGNOSIS — D649 Anemia, unspecified: Secondary | ICD-10-CM

## 2020-08-30 DIAGNOSIS — Z8249 Family history of ischemic heart disease and other diseases of the circulatory system: Secondary | ICD-10-CM | POA: Diagnosis not present

## 2020-08-30 DIAGNOSIS — H409 Unspecified glaucoma: Secondary | ICD-10-CM | POA: Diagnosis present

## 2020-08-30 DIAGNOSIS — I951 Orthostatic hypotension: Secondary | ICD-10-CM | POA: Diagnosis present

## 2020-08-30 DIAGNOSIS — E785 Hyperlipidemia, unspecified: Secondary | ICD-10-CM | POA: Diagnosis present

## 2020-08-30 DIAGNOSIS — H269 Unspecified cataract: Secondary | ICD-10-CM | POA: Diagnosis present

## 2020-08-30 DIAGNOSIS — M1990 Unspecified osteoarthritis, unspecified site: Secondary | ICD-10-CM | POA: Diagnosis present

## 2020-08-30 LAB — BASIC METABOLIC PANEL
Anion gap: 17 — ABNORMAL HIGH (ref 5–15)
BUN: 44 mg/dL — ABNORMAL HIGH (ref 8–23)
CO2: 26 mmol/L (ref 22–32)
Calcium: 9.4 mg/dL (ref 8.9–10.3)
Chloride: 91 mmol/L — ABNORMAL LOW (ref 98–111)
Creatinine, Ser: 10.7 mg/dL — ABNORMAL HIGH (ref 0.61–1.24)
GFR, Estimated: 5 mL/min — ABNORMAL LOW (ref >60)
Glucose, Bld: 97 mg/dL (ref 70–99)
Potassium: 4.1 mmol/L (ref 3.5–5.1)
Sodium: 134 mmol/L — ABNORMAL LOW (ref 135–145)

## 2020-08-30 LAB — ECHOCARDIOGRAM COMPLETE
Area-P 1/2: 2.91 cm2
Height: 71 in
S' Lateral: 2.4 cm
Weight: 2752 oz

## 2020-08-30 LAB — CBG MONITORING, ED: Glucose-Capillary: 89 mg/dL (ref 70–99)

## 2020-08-30 MED ORDER — SODIUM CHLORIDE 0.9 % IV BOLUS
250.0000 mL | Freq: Once | INTRAVENOUS | Status: AC
  Administered 2020-08-30: 250 mL via INTRAVENOUS

## 2020-08-30 MED ORDER — SODIUM CHLORIDE 0.9 % IV BOLUS
500.0000 mL | Freq: Once | INTRAVENOUS | Status: AC
  Administered 2020-08-30: 500 mL via INTRAVENOUS

## 2020-08-30 NOTE — Progress Notes (Signed)
  Echocardiogram 2D Echocardiogram has been performed.  Jennette Dubin 08/30/2020, 10:58 AM

## 2020-08-30 NOTE — ED Notes (Signed)
Pt to ECHO at this time.

## 2020-08-30 NOTE — ED Notes (Signed)
Assuming care of patient at this time. Pt is resting with eyes closed and in NAD. VSS. Monitoring equipment in place. Call bell within reach. Bed In low position.

## 2020-08-30 NOTE — ED Notes (Signed)
Lunch Tray Ordered @ 1033. 

## 2020-08-30 NOTE — Discharge Summary (Signed)
Physician Discharge Summary  Jesus Ayala BZJ:[id number] DOB: [birth year] DOA: 08/29/2020  PCP: Seward Carol, PA-C  Admit date: 08/29/2020 Discharge date: 08/30/2020  Admitted From: Home Disposition:  Home  Recommendations for Outpatient Follow-up:  1. Follow up with PCP in 1-2 weeks 2. Please obtain BMP/CBC in one week   Home Health:No Equipment/Devices:None  Discharge Condition:Stable CODE STATUS:Full Diet recommendation: Heart Healthy   Brief/Interim Summary: 69 y.o. male past medical history significant of end-stage renal disease on hemodialysis Monday Wednesday and Friday comes into the hospital for syncope feeling lightheaded with standing up, palpitations and some mild retrosternal chest pain.  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 He was discharged in stable condition follow-up with nephrology as an outpatient.  Chest pain: Twelve-lead EKG showed no signs of ischemia cardiac biomarkers were negative and flat 2D echo showed no wall motion abnormality likely musculoskeletal.  End-stage renal disease/hyperkalemia: Probably a lab error repeated basic metabolic panel show normal potassium of 4.0. No further work-up.  Normocytic anemia: Follow-up with nephrology as an outpatient.  GERD: Continue PPI.  Discharge Instructions  Discharge Instructions    Diet - low sodium heart healthy   Complete by: As directed    Increase  activity slowly   Complete by: As directed      Allergies as of 08/30/2020      Reactions   Ace Inhibitors Swelling, Other (See Comments)   Enalapril Swelling   ANGIOEDEMA of lips   Iron Sucrose Itching   Venofer  [ferric Oxide] Itching      Medication List    TAKE these medications   acetaminophen 500 MG tablet Commonly known as: TYLENOL Take 1,000 mg by mouth every 6 (six) hours as needed (pain).   aspirin 81 MG chewable tablet Chew 81 mg by mouth every other day.   calcium acetate 667 MG capsule Commonly known as: PHOSLO Take 1 capsule (667 mg total) by mouth 3 (three) times daily with meals. What changed:   when to take this  additional instructions   DIALYVITE 800-ZINC 15 PO Take 1 tablet by mouth daily at 3 pm.   diphenhydrAMINE 25 MG tablet Commonly known as: BENADRYL Take 25 mg by mouth every other day. Allergies/ itching   Ensure Take 237 mLs by mouth daily as needed (supplement).   famotidine 20 MG tablet Commonly known as: PEPCID Take 1 tablet (20 mg total) by mouth 2 (two) times daily.   fluticasone 50 MCG/ACT nasal spray Commonly known as: FLONASE Place 1 spray into both nostrils daily as needed for allergies.   gabapentin 300 MG capsule Commonly known as: NEURONTIN Take 300 mg by mouth 2 (two) times daily as needed (pain).   hydrALAZINE 25 MG tablet Commonly known as: APRESOLINE Take 1 tablet (25 mg total) by mouth 3 (three) times daily.   HYDROcodone-acetaminophen 5-325 MG tablet Commonly known as: Norco Take 1 tablet by mouth every 6 (six) hours as needed for moderate pain.  hydrocortisone cream 0.5 % Apply 1 application topically daily as needed for itching.   latanoprost 0.005 % ophthalmic solution Commonly known as: XALATAN Place 1 drop into both eyes at bedtime.   lidocaine-prilocaine cream Commonly known as: EMLA Apply 1 application topically as needed (port access).   nitroGLYCERIN 0.4 MG SL tablet Commonly known as:  NITROSTAT Place 0.4 mg under the tongue every 5 (five) minutes as needed for chest pain.   pantoprazole 40 MG tablet Commonly known as: PROTONIX Take 40 mg by mouth daily.   pantoprazole 20 MG tablet Commonly known as: PROTONIX Take 1 tablet (20 mg total) by mouth daily.   sodium chloride 0.65 % Soln nasal spray Commonly known as: OCEAN Place 1 spray into both nostrils as needed for congestion.   traZODone 100 MG tablet Commonly known as: DESYREL Take 100 mg by mouth at bedtime.       Allergies  Allergen Reactions  . Ace Inhibitors Swelling and Other (See Comments)  . Enalapril Swelling    ANGIOEDEMA of lips  . Iron Sucrose Itching  . Venofer  [Ferric Oxide] Itching    Consultations:  None   Procedures/Studies: DG Chest Portable 1 View  Result Date: 08/29/2020 CLINICAL DATA:  Chest pain.  Syncopal episode. EXAM: PORTABLE CHEST 1 VIEW COMPARISON:  07/01/2020. FINDINGS: The heart size and mediastinal contours are within normal limits. Both lungs are clear. No visible pleural effusions or pneumothorax. The visualized skeletal structures are unremarkable. IMPRESSION: No acute cardiopulmonary disease. Electronically Signed   By: Margaretha Sheffield MD   On: 08/29/2020 12:24   ECHOCARDIOGRAM COMPLETE  Result Date: 08/30/2020    ECHOCARDIOGRAM REPORT   Patient Name:   Jesus Ayala Date of Exam: 08/30/2020 Medical Rec #:  [id number]         Height:       71.0 in Accession #:    [id number]        Weight:       172.0 lb Date of Birth:  01/26/1951         BSA:          1.978 m Patient Age:    68 years          BP:           100/85 mmHg Patient Gender: M                 HR:           94 bpm. Exam Location:  Inpatient Procedure: 2D Echo Indications:    Syncope R55  History:        Patient has prior history of Echocardiogram examinations, most                 recent 08/31/2019. Risk Factors:Hypertension and Dyslipidemia.  Sonographer:    Mikki Santee RDCS (AE) Referring Phys:  [id number] Lequita Halt IMPRESSIONS  1. Left ventricular ejection fraction, by estimation, is 55 to 60%. The left ventricle has normal function. The left ventricle has no regional wall motion abnormalities. Left ventricular diastolic parameters are consistent with Grade I diastolic dysfunction (impaired relaxation).  2. Right ventricular systolic function is normal. The right ventricular size is normal.  3. The mitral valve is normal in structure. No evidence of mitral valve regurgitation. No evidence of mitral stenosis.  4. The aortic valve is normal in structure. Aortic valve regurgitation is not visualized. No aortic stenosis is present.  5. The inferior vena cava is normal in size  with greater than 50% respiratory variability, suggesting right atrial pressure of 3 mmHg. Comparison(s): No significant change from prior study. Prior images reviewed side by side. Frequent PVCs are see throughout the study. FINDINGS  Left Ventricle: Left ventricular ejection fraction, by estimation, is 55 to 60%. The left ventricle has normal function. The left ventricle has no regional wall motion abnormalities. The left ventricular internal cavity size was normal in size. There is  no left ventricular hypertrophy. Left ventricular diastolic parameters are consistent with Grade I diastolic dysfunction (impaired relaxation). Normal left ventricular filling pressure. Right Ventricle: The right ventricular size is normal. No increase in right ventricular wall thickness. Right ventricular systolic function is normal. Left Atrium: Left atrial size was normal in size. Right Atrium: Right atrial size was normal in size. Pericardium: There is no evidence of pericardial effusion. Mitral Valve: The mitral valve is normal in structure. No evidence of mitral valve regurgitation. No evidence of mitral valve stenosis. Tricuspid Valve: The tricuspid valve is normal in structure. Tricuspid valve regurgitation is not demonstrated. No evidence of  tricuspid stenosis. Aortic Valve: The aortic valve is normal in structure. Aortic valve regurgitation is not visualized. No aortic stenosis is present. Pulmonic Valve: The pulmonic valve was normal in structure. Pulmonic valve regurgitation is not visualized. No evidence of pulmonic stenosis. Aorta: The aortic root is normal in size and structure. Venous: The inferior vena cava is normal in size with greater than 50% respiratory variability, suggesting right atrial pressure of 3 mmHg. IAS/Shunts: No atrial level shunt detected by color flow Doppler.  LEFT VENTRICLE PLAX 2D LVIDd:         3.60 cm  Diastology LVIDs:         2.40 cm  LV e' medial:    7.18 cm/s LV PW:         0.90 cm  LV E/e' medial:  5.5 LV IVS:        0.90 cm  LV e' lateral:   9.55 cm/s LVOT diam:     1.90 cm  LV E/e' lateral: 4.1 LV SV:         42 LV SV Index:   21 LVOT Area:     2.84 cm  RIGHT VENTRICLE RV S prime:     10.30 cm/s TAPSE (M-mode): 1.5 cm LEFT ATRIUM             Index       RIGHT ATRIUM           Index LA diam:        2.00 cm 1.01 cm/m  RA Area:     11.20 cm LA Vol (A2C):   30.6 ml 15.47 ml/m RA Volume:   23.80 ml  12.03 ml/m LA Vol (A4C):   28.1 ml 14.21 ml/m LA Biplane Vol: 31.5 ml 15.93 ml/m  AORTIC VALVE LVOT Vmax:   77.60 cm/s LVOT Vmean:  53.300 cm/s LVOT VTI:    0.148 m  AORTA Ao Root diam: 3.60 cm MITRAL VALVE MV Area (PHT): 2.91 cm    SHUNTS MV Decel Time: 261 msec    Systemic VTI:  0.15 m MV E velocity: 39.40 cm/s  Systemic Diam: 1.90 cm MV A velocity: 67.80 cm/s MV E/A ratio:  0.58 Mihai Croitoru MD Electronically signed by Sanda Klein MD Signature Date/Time: 08/30/2020/12:47:39 PM    Final     (Echo, Carotid, EGD, Colonoscopy, ERCP)    Subjective: No complaints.  Discharge Exam: Vitals:   08/30/20 1000 08/30/20  1129  BP:  115/68  Pulse: 93 (!) 57  Resp: 12 16  Temp:    SpO2: 98% 98%   Vitals:   08/30/20 0930 08/30/20 0945 08/30/20 1000 08/30/20 1129  BP:    115/68  Pulse: 86 (!) 103 93 (!) 57   Resp: (!) 21 16 12 16   Temp:      TempSrc:      SpO2: 100% 98% 98% 98%  Weight:      Height:        General: Pt is alert, awake, not in acute distress Cardiovascular: RRR, S1/S2 +, no rubs, no gallops Respiratory: CTA bilaterally, no wheezing, no rhonchi Abdominal: Soft, NT, ND, bowel sounds + Extremities: no edema, no cyanosis    The results of significant diagnostics from this hospitalization (including imaging, microbiology, ancillary and laboratory) are listed below for reference.     Microbiology: Recent Results (from the past 240 hour(s))  Respiratory Panel by RT PCR (Flu A&B, Covid) - Nasopharyngeal Swab     Status: None   Collection Time: 08/29/20  4:03 PM   Specimen: Nasopharyngeal Swab  Result Value Ref Range Status   SARS Coronavirus 2 by RT PCR NEGATIVE NEGATIVE Final    Comment: (NOTE) SARS-CoV-2 target nucleic acids are NOT DETECTED.  The SARS-CoV-2 RNA is generally detectable in upper respiratoy specimens during the acute phase of infection. The lowest concentration of SARS-CoV-2 viral copies this assay can detect is 131 copies/mL. A negative result does not preclude SARS-Cov-2 infection and should not be used as the sole basis for treatment or other patient management decisions. A negative result may occur with  improper specimen collection/handling, submission of specimen other than nasopharyngeal swab, presence of viral mutation(s) within the areas targeted by this assay, and inadequate number of viral copies (<131 copies/mL). A negative result must be combined with clinical observations, patient history, and epidemiological information. The expected result is Negative.  Fact Sheet for Patients:  PinkCheek.be  Fact Sheet for Healthcare Providers:  GravelBags.it  This test is no t yet approved or cleared by the [place] FDA and  has been authorized for detection and/or diagnosis of  SARS-CoV-2 by FDA under an Emergency Use Authorization (EUA). This EUA will remain  in effect (meaning this test can be used) for the duration of the COVID-19 declaration under Section 564(b)(1) of the Act, 21 U.S.C. section 360bbb-3(b)(1), unless the authorization is terminated or revoked sooner.     Influenza A by PCR NEGATIVE NEGATIVE Final   Influenza B by PCR NEGATIVE NEGATIVE Final    Comment: (NOTE) The Xpert Xpress SARS-CoV-2/FLU/RSV assay is intended as an aid in  the diagnosis of influenza from Nasopharyngeal swab specimens and  should not be used as a sole basis for treatment. Nasal washings and  aspirates are unacceptable for Xpert Xpress SARS-CoV-2/FLU/RSV  testing.  Fact Sheet for Patients: PinkCheek.be  Fact Sheet for Healthcare Providers: GravelBags.it  This test is not yet approved or cleared by the [place] FDA and  has been authorized for detection and/or diagnosis of SARS-CoV-2 by  FDA under an Emergency Use Authorization (EUA). This EUA will remain  in effect (meaning this test can be used) for the duration of the  Covid-19 declaration under Section 564(b)(1) of the Act, 21  U.S.C. section 360bbb-3(b)(1), unless the authorization is  terminated or revoked. Performed at [facility], [place] [address] [place], [place] [address]      Labs: BNP (last 3 results) No results  for input(s): BNP in the last 8760 hours. Basic Metabolic Panel: Recent Labs  Lab 08/29/20 1410 08/29/20 1603 08/30/20 0303  NA 136  --  134*  K 6.3* 4.4 4.1  CL 91*  --  91*  CO2 31  --  26  GLUCOSE 102*  --  97  BUN 26*  --  44*  CREATININE 9.32*  --  10.70*  CALCIUM 10.5*  --  9.4  MG 2.3  --   --   PHOS 3.0  --   --    Liver Function Tests: No results for input(s): AST, ALT, ALKPHOS, BILITOT, PROT, ALBUMIN in the last 168 hours. No results for input(s): LIPASE, AMYLASE in the last 168 hours. No results  for input(s): AMMONIA in the last 168 hours. CBC: Recent Labs  Lab 08/29/20 1319  WBC 7.2  NEUTROABS 5.4  HGB 14.5  HCT 47.3  MCV 96.3  PLT 150   Cardiac Enzymes: No results for input(s): CKTOTAL, CKMB, CKMBINDEX, TROPONINI in the last 168 hours. BNP: Invalid input(s): POCBNP CBG: Recent Labs  Lab 08/30/20 0738  GLUCAP 89   D-Dimer No results for input(s): DDIMER in the last 72 hours. Hgb A1c No results for input(s): HGBA1C in the last 72 hours. Lipid Profile No results for input(s): CHOL, HDL, LDLCALC, TRIG, CHOLHDL, LDLDIRECT in the last 72 hours. Thyroid function studies No results for input(s): TSH, T4TOTAL, T3FREE, THYROIDAB in the last 72 hours.  Invalid input(s): FREET3 Anemia work up No results for input(s): VITAMINB12, FOLATE, FERRITIN, TIBC, IRON, RETICCTPCT in the last 72 hours. Urinalysis    Component Value Date/Time   COLORURINE YELLOW 08/25/2019 1540   APPEARANCEUR CLEAR 08/25/2019 1540   LABSPEC 1.011 08/25/2019 1540   PHURINE 8.0 08/25/2019 1540   GLUCOSEU NEGATIVE 08/25/2019 1540   HGBUR NEGATIVE 08/25/2019 1540   BILIRUBINUR NEGATIVE 08/25/2019 1540   KETONESUR NEGATIVE 08/25/2019 1540   PROTEINUR >=300 (A) 08/25/2019 1540   UROBILINOGEN 1.0 12/12/2009 0201   NITRITE NEGATIVE 08/25/2019 1540   LEUKOCYTESUR NEGATIVE 08/25/2019 1540   Sepsis Labs Invalid input(s): PROCALCITONIN,  WBC,  LACTICIDVEN Microbiology Recent Results (from the past 240 hour(s))  Respiratory Panel by RT PCR (Flu A&B, Covid) - Nasopharyngeal Swab     Status: None   Collection Time: 08/29/20  4:03 PM   Specimen: Nasopharyngeal Swab  Result Value Ref Range Status   SARS Coronavirus 2 by RT PCR NEGATIVE NEGATIVE Final    Comment: (NOTE) SARS-CoV-2 target nucleic acids are NOT DETECTED.  The SARS-CoV-2 RNA is generally detectable in upper respiratoy specimens during the acute phase of infection. The lowest concentration of SARS-CoV-2 viral copies this assay can detect  is 131 copies/mL. A negative result does not preclude SARS-Cov-2 infection and should not be used as the sole basis for treatment or other patient management decisions. A negative result may occur with  improper specimen collection/handling, submission of specimen other than nasopharyngeal swab, presence of viral mutation(s) within the areas targeted by this assay, and inadequate number of viral copies (<131 copies/mL). A negative result must be combined with clinical observations, patient history, and epidemiological information. The expected result is Negative.  Fact Sheet for Patients:  PinkCheek.be  Fact Sheet for Healthcare Providers:  GravelBags.it  This test is no t yet approved or cleared by the [place] FDA and  has been authorized for detection and/or diagnosis of SARS-CoV-2 by FDA under an Emergency Use Authorization (EUA). This EUA will remain  in effect (meaning this test  can be used) for the duration of the COVID-19 declaration under Section 564(b)(1) of the Act, 21 U.S.C. section 360bbb-3(b)(1), unless the authorization is terminated or revoked sooner.     Influenza A by PCR NEGATIVE NEGATIVE Final   Influenza B by PCR NEGATIVE NEGATIVE Final    Comment: (NOTE) The Xpert Xpress SARS-CoV-2/FLU/RSV assay is intended as an aid in  the diagnosis of influenza from Nasopharyngeal swab specimens and  should not be used as a sole basis for treatment. Nasal washings and  aspirates are unacceptable for Xpert Xpress SARS-CoV-2/FLU/RSV  testing.  Fact Sheet for Patients: PinkCheek.be  Fact Sheet for Healthcare Providers: GravelBags.it  This test is not yet approved or cleared by the [place] FDA and  has been authorized for detection and/or diagnosis of SARS-CoV-2 by  FDA under an Emergency Use Authorization (EUA). This EUA will remain  in effect  (meaning this test can be used) for the duration of the  Covid-19 declaration under Section 564(b)(1) of the Act, 21  U.S.C. section 360bbb-3(b)(1), unless the authorization is  terminated or revoked. Performed at [facility], [place] [address] [place], [place] [address]      Time coordinating discharge: Over 40 minutes  SIGNED:   Charlynne Cousins, MD  [facility] 08/30/2020, 12:52 PM Pager   If 7PM-7AM, please contact night-coverage [email or web address] Password [email or web address]

## 2020-08-30 NOTE — Progress Notes (Signed)
[facility] PROGRESS NOTE    Progress Note  Jesus Ayala  TIR:[id number] DOB: 03/26/1951 DOA: 08/29/2020 PCP: Seward Carol, PA-C     Brief Narrative:   Jesus Ayala is an 69 y.o. male past medical history significant of end-stage renal disease on hemodialysis Monday Wednesday and Friday comes into the hospital for syncope feeling lightheaded with standing up, palpitations and some mild retrosternal chest pain.  He also relates he lost consciousness.  Assessment/Plan:   Syncope: He relates dizziness upon standing concern about orthostasis.  Unlikely vasovagal. 2D echo is pending, will allow oral hydration discussed with renal to decrease the amount of net negative during dialysis. Orthostatics were positive.  We will give him a 500 cc bolus recheck orthostatics await 2D echo if echo negative and orthostatics resolved can be discharged later on today.  Chest pain: 2D echo is pending, troponins remain flat twelve-lead EKG showed no signs of ischemia.  End-stage renal disease/hyperkalemia: Probably a lab error, we have consulted renal for dialysis.  Should be fixed with this.    Normocytic anemia Further management per renal.  GERD: Continue PPI.  DVT prophylaxis: lovenxo Family Communication:none Status is: Observation  The patient will require care spanning > 2 midnights and should be moved to inpatient because: Hemodynamically unstable  Dispo: The patient is from: Home              Anticipated d/c is to: Home              Anticipated d/c date is: 1 day              Patient currently is not medically stable to d/c.        Code Status:     Code Status Orders  (From admission, onward)         Start     Ordered   08/29/20 1747  Full code  Continuous        08/29/20 1748        Code Status History    Date Active Date Inactive Code Status Order ID Comments User Context   06/03/2020 0938 06/03/2020 1527 Full Code [id number]  Elam Dutch,  MD Inpatient   06/25/2019 0345 06/26/2019 1920 Full Code [id number]  Etta Quill, DO ED   04/19/2019 0428 04/22/2019 1737 Full Code [id number]  Ivor Costa, MD Inpatient   Advance Care Planning Activity        IV Access:    Peripheral IV   Procedures and diagnostic studies:   DG Chest Portable 1 View  Result Date: 08/29/2020 CLINICAL DATA:  Chest pain.  Syncopal episode. EXAM: PORTABLE CHEST 1 VIEW COMPARISON:  07/01/2020. FINDINGS: The heart size and mediastinal contours are within normal limits. Both lungs are clear. No visible pleural effusions or pneumothorax. The visualized skeletal structures are unremarkable. IMPRESSION: No acute cardiopulmonary disease. Electronically Signed   By: Margaretha Sheffield MD   On: 08/29/2020 12:24     Medical Consultants:    None.  Anti-Infectives:   none  Subjective:    Jesus Ayala no new complaints but he does relate dizziness upon standing very nicely.  Objective:    Vitals:   08/30/20 0245 08/30/20 0300 08/30/20 0415 08/30/20 0500  BP: 112/82 98/70 100/70 (!) 85/66  Pulse: 86 89 89 92  Resp: 12 11 11 11   Temp:      TempSrc:      SpO2: 98% 98% 97% 97%  Weight:  Height:       SpO2: 97 %  No intake or output data in the 24 hours ending 08/30/20 0733 Filed Weights   08/29/20 1154  Weight: 78 kg    Exam: General exam: In no acute distress. Respiratory system: Good air movement and clear to auscultation. Cardiovascular system: S1 & S2 heard, RRR. No JVD. Gastrointestinal system: Abdomen is nondistended, soft and nontender.  Extremities: No pedal edema. Skin: No rashes, lesions or ulcers  Data Reviewed:    Labs: Basic Metabolic Panel: Recent Labs  Lab 08/29/20 1410 08/29/20 1410 08/29/20 1603 08/30/20 0303  NA 136  --   --  134*  K 6.3*   < > 4.4 4.1  CL 91*  --   --  91*  CO2 31  --   --  26  GLUCOSE 102*  --   --  97  BUN 26*  --   --  44*  CREATININE 9.32*  --   --  10.70*  CALCIUM 10.5*  --    --  9.4  MG 2.3  --   --   --   PHOS 3.0  --   --   --    < > = values in this interval not displayed.   GFR Estimated Creatinine Clearance: 7 mL/min (A) (by C-G formula based on SCr of 10.7 mg/dL (H)). Liver Function Tests: No results for input(s): AST, ALT, ALKPHOS, BILITOT, PROT, ALBUMIN in the last 168 hours. No results for input(s): LIPASE, AMYLASE in the last 168 hours. No results for input(s): AMMONIA in the last 168 hours. Coagulation profile No results for input(s): INR, PROTIME in the last 168 hours. COVID-19 Labs  No results for input(s): DDIMER, FERRITIN, LDH, CRP in the last 72 hours.  Lab Results  Component Value Date   SARSCOV2NAA NEGATIVE 08/29/2020   SARSCOV2NAA NEGATIVE 06/25/2020   [place] NEGATIVE 06/02/2020   [place] NEGATIVE 02/22/2020    CBC: Recent Labs  Lab 08/29/20 1319  WBC 7.2  NEUTROABS 5.4  HGB 14.5  HCT 47.3  MCV 96.3  PLT 150   Cardiac Enzymes: No results for input(s): CKTOTAL, CKMB, CKMBINDEX, TROPONINI in the last 168 hours. BNP (last 3 results) No results for input(s): PROBNP in the last 8760 hours. CBG: No results for input(s): GLUCAP in the last 168 hours. D-Dimer: No results for input(s): DDIMER in the last 72 hours. Hgb A1c: No results for input(s): HGBA1C in the last 72 hours. Lipid Profile: No results for input(s): CHOL, HDL, LDLCALC, TRIG, CHOLHDL, LDLDIRECT in the last 72 hours. Thyroid function studies: No results for input(s): TSH, T4TOTAL, T3FREE, THYROIDAB in the last 72 hours.  Invalid input(s): FREET3 Anemia work up: No results for input(s): VITAMINB12, FOLATE, FERRITIN, TIBC, IRON, RETICCTPCT in the last 72 hours. Sepsis Labs: Recent Labs  Lab 08/29/20 1319  WBC 7.2   Microbiology Recent Results (from the past 240 hour(s))  Respiratory Panel by RT PCR (Flu A&B, Covid) - Nasopharyngeal Swab     Status: None   Collection Time: 08/29/20  4:03 PM   Specimen: Nasopharyngeal Swab  Result Value Ref  Range Status   SARS Coronavirus 2 by RT PCR NEGATIVE NEGATIVE Final    Comment: (NOTE) SARS-CoV-2 target nucleic acids are NOT DETECTED.  The SARS-CoV-2 RNA is generally detectable in upper respiratoy specimens during the acute phase of infection. The lowest concentration of SARS-CoV-2 viral copies this assay can detect is 131 copies/mL. A negative result does not preclude SARS-Cov-2 infection  and should not be used as the sole basis for treatment or other patient management decisions. A negative result may occur with  improper specimen collection/handling, submission of specimen other than nasopharyngeal swab, presence of viral mutation(s) within the areas targeted by this assay, and inadequate number of viral copies (<131 copies/mL). A negative result must be combined with clinical observations, patient history, and epidemiological information. The expected result is Negative.  Fact Sheet for Patients:  PinkCheek.be  Fact Sheet for Healthcare Providers:  GravelBags.it  This test is no t yet approved or cleared by the [place] FDA and  has been authorized for detection and/or diagnosis of SARS-CoV-2 by FDA under an Emergency Use Authorization (EUA). This EUA will remain  in effect (meaning this test can be used) for the duration of the COVID-19 declaration under Section 564(b)(1) of the Act, 21 U.S.C. section 360bbb-3(b)(1), unless the authorization is terminated or revoked sooner.     Influenza A by PCR NEGATIVE NEGATIVE Final   Influenza B by PCR NEGATIVE NEGATIVE Final    Comment: (NOTE) The Xpert Xpress SARS-CoV-2/FLU/RSV assay is intended as an aid in  the diagnosis of influenza from Nasopharyngeal swab specimens and  should not be used as a sole basis for treatment. Nasal washings and  aspirates are unacceptable for Xpert Xpress SARS-CoV-2/FLU/RSV  testing.  Fact Sheet for  Patients: PinkCheek.be  Fact Sheet for Healthcare Providers: GravelBags.it  This test is not yet approved or cleared by the [place] FDA and  has been authorized for detection and/or diagnosis of SARS-CoV-2 by  FDA under an Emergency Use Authorization (EUA). This EUA will remain  in effect (meaning this test can be used) for the duration of the  Covid-19 declaration under Section 564(b)(1) of the Act, 21  U.S.C. section 360bbb-3(b)(1), unless the authorization is  terminated or revoked. Performed at [facility], [place] [address] [place], [place] [address]      Medications:   . aspirin  81 mg Oral QODAY  . calcium acetate  667 mg Oral TID with meals  . [START ON 08/31/2020] diphenhydrAMINE  25 mg Oral QODAY  . famotidine  20 mg Oral QHS  . heparin  5,000 Units Subcutaneous Q12H  . pantoprazole  40 mg Oral Daily  . sodium chloride flush  3 mL Intravenous Q12H  . traZODone  100 mg Oral QHS   Continuous Infusions: . sodium chloride        LOS: 0 days   Charlynne Cousins  [facility]  08/30/2020, 7:33 AM

## 2020-08-31 ENCOUNTER — Telehealth: Payer: Self-pay | Admitting: Nephrology

## 2020-08-31 DIAGNOSIS — N186 End stage renal disease: Secondary | ICD-10-CM | POA: Diagnosis not present

## 2020-08-31 DIAGNOSIS — D689 Coagulation defect, unspecified: Secondary | ICD-10-CM | POA: Diagnosis not present

## 2020-08-31 DIAGNOSIS — D631 Anemia in chronic kidney disease: Secondary | ICD-10-CM | POA: Diagnosis not present

## 2020-08-31 DIAGNOSIS — N2581 Secondary hyperparathyroidism of renal origin: Secondary | ICD-10-CM | POA: Diagnosis not present

## 2020-08-31 DIAGNOSIS — Z992 Dependence on renal dialysis: Secondary | ICD-10-CM | POA: Diagnosis not present

## 2020-08-31 NOTE — Telephone Encounter (Signed)
Transition of care contact from inpatient facility  Date of discharge: 08/30/20 Date of contact: 08/31/20 Method: Phone Spoke to: Patient  Patient contacted to discuss transition of care from recent inpatient hospitalization. Patient was admitted to [facility] from 11/8-11/9/21 with discharge diagnosis of syncope/orthostatic hypotension   Medication changes were reviewed.  Patient will follow up with his/her outpatient HD unit on: He went to dialysis today with no issues. His dry weight was raised.

## 2020-09-02 DIAGNOSIS — D689 Coagulation defect, unspecified: Secondary | ICD-10-CM | POA: Diagnosis not present

## 2020-09-02 DIAGNOSIS — D631 Anemia in chronic kidney disease: Secondary | ICD-10-CM | POA: Diagnosis not present

## 2020-09-02 DIAGNOSIS — Z992 Dependence on renal dialysis: Secondary | ICD-10-CM | POA: Diagnosis not present

## 2020-09-02 DIAGNOSIS — N186 End stage renal disease: Secondary | ICD-10-CM | POA: Diagnosis not present

## 2020-09-02 DIAGNOSIS — N2581 Secondary hyperparathyroidism of renal origin: Secondary | ICD-10-CM | POA: Diagnosis not present

## 2020-09-05 DIAGNOSIS — D631 Anemia in chronic kidney disease: Secondary | ICD-10-CM | POA: Diagnosis not present

## 2020-09-05 DIAGNOSIS — N186 End stage renal disease: Secondary | ICD-10-CM | POA: Diagnosis not present

## 2020-09-05 DIAGNOSIS — N2581 Secondary hyperparathyroidism of renal origin: Secondary | ICD-10-CM | POA: Diagnosis not present

## 2020-09-05 DIAGNOSIS — D689 Coagulation defect, unspecified: Secondary | ICD-10-CM | POA: Diagnosis not present

## 2020-09-05 DIAGNOSIS — Z992 Dependence on renal dialysis: Secondary | ICD-10-CM | POA: Diagnosis not present

## 2020-09-07 DIAGNOSIS — D689 Coagulation defect, unspecified: Secondary | ICD-10-CM | POA: Diagnosis not present

## 2020-09-07 DIAGNOSIS — Z992 Dependence on renal dialysis: Secondary | ICD-10-CM | POA: Diagnosis not present

## 2020-09-07 DIAGNOSIS — N2581 Secondary hyperparathyroidism of renal origin: Secondary | ICD-10-CM | POA: Diagnosis not present

## 2020-09-07 DIAGNOSIS — D631 Anemia in chronic kidney disease: Secondary | ICD-10-CM | POA: Diagnosis not present

## 2020-09-07 DIAGNOSIS — N186 End stage renal disease: Secondary | ICD-10-CM | POA: Diagnosis not present

## 2020-09-08 DIAGNOSIS — R079 Chest pain, unspecified: Secondary | ICD-10-CM | POA: Diagnosis not present

## 2020-09-09 DIAGNOSIS — Z992 Dependence on renal dialysis: Secondary | ICD-10-CM | POA: Diagnosis not present

## 2020-09-09 DIAGNOSIS — N186 End stage renal disease: Secondary | ICD-10-CM | POA: Diagnosis not present

## 2020-09-09 DIAGNOSIS — D689 Coagulation defect, unspecified: Secondary | ICD-10-CM | POA: Diagnosis not present

## 2020-09-09 DIAGNOSIS — D631 Anemia in chronic kidney disease: Secondary | ICD-10-CM | POA: Diagnosis not present

## 2020-09-09 DIAGNOSIS — N2581 Secondary hyperparathyroidism of renal origin: Secondary | ICD-10-CM | POA: Diagnosis not present

## 2020-09-11 DIAGNOSIS — D689 Coagulation defect, unspecified: Secondary | ICD-10-CM | POA: Diagnosis not present

## 2020-09-11 DIAGNOSIS — N186 End stage renal disease: Secondary | ICD-10-CM | POA: Diagnosis not present

## 2020-09-11 DIAGNOSIS — Z992 Dependence on renal dialysis: Secondary | ICD-10-CM | POA: Diagnosis not present

## 2020-09-11 DIAGNOSIS — D631 Anemia in chronic kidney disease: Secondary | ICD-10-CM | POA: Diagnosis not present

## 2020-09-11 DIAGNOSIS — N2581 Secondary hyperparathyroidism of renal origin: Secondary | ICD-10-CM | POA: Diagnosis not present

## 2020-09-13 DIAGNOSIS — Z992 Dependence on renal dialysis: Secondary | ICD-10-CM | POA: Diagnosis not present

## 2020-09-13 DIAGNOSIS — D689 Coagulation defect, unspecified: Secondary | ICD-10-CM | POA: Diagnosis not present

## 2020-09-13 DIAGNOSIS — N186 End stage renal disease: Secondary | ICD-10-CM | POA: Diagnosis not present

## 2020-09-13 DIAGNOSIS — D631 Anemia in chronic kidney disease: Secondary | ICD-10-CM | POA: Diagnosis not present

## 2020-09-13 DIAGNOSIS — N2581 Secondary hyperparathyroidism of renal origin: Secondary | ICD-10-CM | POA: Diagnosis not present

## 2020-09-16 DIAGNOSIS — Z992 Dependence on renal dialysis: Secondary | ICD-10-CM | POA: Diagnosis not present

## 2020-09-16 DIAGNOSIS — N186 End stage renal disease: Secondary | ICD-10-CM | POA: Diagnosis not present

## 2020-09-16 DIAGNOSIS — D631 Anemia in chronic kidney disease: Secondary | ICD-10-CM | POA: Diagnosis not present

## 2020-09-16 DIAGNOSIS — N2581 Secondary hyperparathyroidism of renal origin: Secondary | ICD-10-CM | POA: Diagnosis not present

## 2020-09-16 DIAGNOSIS — D689 Coagulation defect, unspecified: Secondary | ICD-10-CM | POA: Diagnosis not present

## 2020-09-19 DIAGNOSIS — D689 Coagulation defect, unspecified: Secondary | ICD-10-CM | POA: Diagnosis not present

## 2020-09-19 DIAGNOSIS — N2581 Secondary hyperparathyroidism of renal origin: Secondary | ICD-10-CM | POA: Diagnosis not present

## 2020-09-19 DIAGNOSIS — D631 Anemia in chronic kidney disease: Secondary | ICD-10-CM | POA: Diagnosis not present

## 2020-09-19 DIAGNOSIS — N186 End stage renal disease: Secondary | ICD-10-CM | POA: Diagnosis not present

## 2020-09-19 DIAGNOSIS — Z992 Dependence on renal dialysis: Secondary | ICD-10-CM | POA: Diagnosis not present

## 2020-09-20 DIAGNOSIS — I129 Hypertensive chronic kidney disease with stage 1 through stage 4 chronic kidney disease, or unspecified chronic kidney disease: Secondary | ICD-10-CM | POA: Diagnosis not present

## 2020-09-20 DIAGNOSIS — N186 End stage renal disease: Secondary | ICD-10-CM | POA: Diagnosis not present

## 2020-09-20 DIAGNOSIS — Z992 Dependence on renal dialysis: Secondary | ICD-10-CM | POA: Diagnosis not present

## 2020-09-21 DIAGNOSIS — D509 Iron deficiency anemia, unspecified: Secondary | ICD-10-CM | POA: Diagnosis not present

## 2020-09-21 DIAGNOSIS — N2581 Secondary hyperparathyroidism of renal origin: Secondary | ICD-10-CM | POA: Diagnosis not present

## 2020-09-21 DIAGNOSIS — Z992 Dependence on renal dialysis: Secondary | ICD-10-CM | POA: Diagnosis not present

## 2020-09-21 DIAGNOSIS — D631 Anemia in chronic kidney disease: Secondary | ICD-10-CM | POA: Diagnosis not present

## 2020-09-21 DIAGNOSIS — L299 Pruritus, unspecified: Secondary | ICD-10-CM | POA: Diagnosis not present

## 2020-09-21 DIAGNOSIS — N186 End stage renal disease: Secondary | ICD-10-CM | POA: Diagnosis not present

## 2020-09-21 DIAGNOSIS — R52 Pain, unspecified: Secondary | ICD-10-CM | POA: Diagnosis not present

## 2020-09-21 DIAGNOSIS — D689 Coagulation defect, unspecified: Secondary | ICD-10-CM | POA: Diagnosis not present

## 2020-09-29 DIAGNOSIS — H5203 Hypermetropia, bilateral: Secondary | ICD-10-CM | POA: Diagnosis not present

## 2020-10-19 DIAGNOSIS — N186 End stage renal disease: Secondary | ICD-10-CM | POA: Diagnosis not present

## 2020-10-19 DIAGNOSIS — D689 Coagulation defect, unspecified: Secondary | ICD-10-CM | POA: Diagnosis not present

## 2020-10-19 DIAGNOSIS — R52 Pain, unspecified: Secondary | ICD-10-CM | POA: Diagnosis not present

## 2020-10-19 DIAGNOSIS — N2581 Secondary hyperparathyroidism of renal origin: Secondary | ICD-10-CM | POA: Diagnosis not present

## 2020-10-19 DIAGNOSIS — L299 Pruritus, unspecified: Secondary | ICD-10-CM | POA: Diagnosis not present

## 2020-10-19 DIAGNOSIS — D631 Anemia in chronic kidney disease: Secondary | ICD-10-CM | POA: Diagnosis not present

## 2020-10-19 DIAGNOSIS — D509 Iron deficiency anemia, unspecified: Secondary | ICD-10-CM | POA: Diagnosis not present

## 2020-10-19 DIAGNOSIS — Z992 Dependence on renal dialysis: Secondary | ICD-10-CM | POA: Diagnosis not present

## 2020-10-21 DIAGNOSIS — N186 End stage renal disease: Secondary | ICD-10-CM | POA: Diagnosis not present

## 2020-10-21 DIAGNOSIS — Z992 Dependence on renal dialysis: Secondary | ICD-10-CM | POA: Diagnosis not present

## 2020-10-21 DIAGNOSIS — I129 Hypertensive chronic kidney disease with stage 1 through stage 4 chronic kidney disease, or unspecified chronic kidney disease: Secondary | ICD-10-CM | POA: Diagnosis not present

## 2021-09-08 ENCOUNTER — Other Ambulatory Visit: Payer: Self-pay | Admitting: Nurse Practitioner

## 2021-11-22 ENCOUNTER — Telehealth (HOSPITAL_COMMUNITY): Payer: Self-pay | Admitting: *Deleted

## 2021-11-22 NOTE — Telephone Encounter (Signed)
Received referral notification from Dr. Lamonte Sakai at [facility] for this pt to participate in Cardiac rehab s/p planned DES LAD on 11/09/21.  Noted that pt has follow up with cardiology on 01/05/22 (previously scheduled prior to procedure)  Pt did complete follow up with PCP on 1/25.  During the appt he did mention having CP while ambulating up a hill did not take NTG.  Also noted that pt has HD on MWF.  Called and spoke to pt to determine whether he felt he could exercise on the same days as dialysis.  Pt usually gets done around 10 or 11.  He is unsure if he could exercise as he is pretty wiped out.   Pt will call to see if he can get seen sooner than March (must complete follow up prior to scheduling) and their advice on whether exercising on his own would be more beneficial.  Pt will contact me with new appt date and will think about whether continuing to walk where he is currently walking. Cherre Huger, BSN Cardiac and Training and development officer

## 2022-03-20 ENCOUNTER — Encounter (HOSPITAL_COMMUNITY): Payer: Self-pay

## 2022-03-27 ENCOUNTER — Ambulatory Visit: Payer: Medicare HMO | Admitting: Dermatology

## 2022-03-27 ENCOUNTER — Telehealth: Payer: Self-pay | Admitting: Dermatology

## 2022-03-27 ENCOUNTER — Ambulatory Visit (INDEPENDENT_AMBULATORY_CARE_PROVIDER_SITE_OTHER): Payer: Medicare HMO | Admitting: Dermatology

## 2022-03-27 DIAGNOSIS — L720 Epidermal cyst: Secondary | ICD-10-CM | POA: Diagnosis not present

## 2022-03-27 DIAGNOSIS — L910 Hypertrophic scar: Secondary | ICD-10-CM | POA: Diagnosis not present

## 2022-03-27 MED ORDER — TRIAMCINOLONE ACETONIDE 40 MG/ML IJ SUSP
40.0000 mg | Freq: Once | INTRAMUSCULAR | Status: AC
  Administered 2022-03-27: 40 mg

## 2022-03-27 NOTE — Telephone Encounter (Signed)
Patient is calling saying that Lavonna Monarch, M.D. gave him injections at his appointment this morning and he wants to know what mind of side effects he may have from these.

## 2022-03-29 NOTE — Telephone Encounter (Signed)
Patient aware no side effects

## 2022-04-13 ENCOUNTER — Encounter: Payer: Self-pay | Admitting: Dermatology

## 2022-04-13 NOTE — Progress Notes (Signed)
   New Patient   Subjective  Jesus Ayala is a 71 y.o. male who presents for the following: New Patient (Initial Visit) (Patient has rash on chest. X years. Its dark and bumpy and sometimes itch. The previous derm gave him TAC ointment and said it was keloids. Patient is a dialysis patient. The cream did not help. /Patient also has a cyst on his back. ).  Spots chest, lesion on back Location:  Duration:  Quality:  Associated Signs/Symptoms: Modifying Factors:  Severity:  Timing: Context:    The following portions of the chart were reviewed this encounter and updated as appropriate:  Tobacco  Allergies  Meds  Problems  Med Hx  Surg Hx  Fam Hx      Objective  Well appearing patient in no apparent distress; mood and affect are within normal limits. Chest - Medial [facility]), Right Breast Multiple keloids, historically symptomatic.  Discussed a series of intralesional triamcinolone injections which help to soften and/or flatten and/or relieve symptoms and roughly 60 to 70% of patients.  He did wish to proceed with this.  Mid Back Noninflamed 1.8 cm deep dermal subcutaneous nodule    All skin waist up examined.   Assessment & Plan  Keloid (2) Chest - Medial [facility]); Right Breast  Intralesional injection - Chest - Medial [facility]), Right Breast Location: chest  Informed Consent: Discussed risks (infection, pain, bleeding, bruising, thinning of the skin, loss of skin pigment,  Indentation, lack of resolution, and recurrence of lesion) and benefits of the procedure, as well as the alternatives. Informed consent was obtained. Preparation: The area was prepared in a standard fashion.   Procedure Details: An intralesional injection was performed with Kenalog 40 mg/cc, . 1 cc in total were injected.  Total number of injections: 6  Plan: The patient was instructed on post-op care. Recommend OTC analgesia as needed for pain.   Related Medications triamcinolone  acetonide (KENALOG-40) injection 40 mg   Epidermal cyst Mid Back  Discussed elective excision, no procedure scheduled

## 2022-05-08 ENCOUNTER — Ambulatory Visit: Payer: Medicare HMO | Admitting: Dermatology

## 2022-05-14 ENCOUNTER — Ambulatory Visit: Payer: Medicare HMO | Admitting: Dermatology

## 2022-06-06 ENCOUNTER — Encounter (HOSPITAL_COMMUNITY): Payer: Self-pay | Admitting: Emergency Medicine

## 2022-06-06 ENCOUNTER — Other Ambulatory Visit: Payer: Self-pay

## 2022-06-06 ENCOUNTER — Emergency Department (HOSPITAL_COMMUNITY)
Admission: EM | Admit: 2022-06-06 | Discharge: 2022-06-07 | Discharge status: Against Medical Advice | Payer: Medicare HMO | Attending: Emergency Medicine | Admitting: Emergency Medicine

## 2022-06-06 ENCOUNTER — Emergency Department (HOSPITAL_COMMUNITY): Payer: Medicare HMO

## 2022-06-06 DIAGNOSIS — R059 Cough, unspecified: Secondary | ICD-10-CM | POA: Insufficient documentation

## 2022-06-06 DIAGNOSIS — R197 Diarrhea, unspecified: Secondary | ICD-10-CM | POA: Diagnosis not present

## 2022-06-06 DIAGNOSIS — Z5321 Procedure and treatment not carried out due to patient leaving prior to being seen by health care provider: Secondary | ICD-10-CM | POA: Diagnosis not present

## 2022-06-06 DIAGNOSIS — Z992 Dependence on renal dialysis: Secondary | ICD-10-CM | POA: Diagnosis not present

## 2022-06-06 DIAGNOSIS — Z20822 Contact with and (suspected) exposure to covid-19: Secondary | ICD-10-CM | POA: Diagnosis not present

## 2022-06-06 LAB — BASIC METABOLIC PANEL
Anion gap: 15 (ref 5–15)
BUN: 16 mg/dL (ref 8–23)
CO2: 27 mmol/L (ref 22–32)
Calcium: 8.3 mg/dL — ABNORMAL LOW (ref 8.9–10.3)
Chloride: 96 mmol/L — ABNORMAL LOW (ref 98–111)
Creatinine, Ser: 8.09 mg/dL — ABNORMAL HIGH (ref 0.61–1.24)
GFR, Estimated: 7 mL/min — ABNORMAL LOW (ref >60)
Glucose, Bld: 91 mg/dL (ref 70–99)
Potassium: 3.6 mmol/L (ref 3.5–5.1)
Sodium: 138 mmol/L (ref 135–145)

## 2022-06-06 LAB — CBC WITH DIFFERENTIAL/PLATELET
Abs Immature Granulocytes: 0.02 10*3/uL (ref 0.00–0.07)
Basophils Absolute: 0 10*3/uL (ref 0.0–0.1)
Basophils Relative: 1 %
Eosinophils Absolute: 0.2 10*3/uL (ref 0.0–0.5)
Eosinophils Relative: 4 %
HCT: 34.2 % — ABNORMAL LOW (ref 39.0–52.0)
Hemoglobin: 11 g/dL — ABNORMAL LOW (ref 13.0–17.0)
Immature Granulocytes: 0 %
Lymphocytes Relative: 22 %
Lymphs Abs: 1.3 10*3/uL (ref 0.7–4.0)
MCH: 29.4 pg (ref 26.0–34.0)
MCHC: 32.2 g/dL (ref 30.0–36.0)
MCV: 91.4 fL (ref 80.0–100.0)
Monocytes Absolute: 0.7 10*3/uL (ref 0.1–1.0)
Monocytes Relative: 12 %
Neutro Abs: 3.5 10*3/uL (ref 1.7–7.7)
Neutrophils Relative %: 61 %
Platelets: 213 10*3/uL (ref 150–400)
RBC: 3.74 MIL/uL — ABNORMAL LOW (ref 4.22–5.81)
RDW: 16.4 % — ABNORMAL HIGH (ref 11.5–15.5)
WBC: 5.7 10*3/uL (ref 4.0–10.5)
nRBC: 0 % (ref 0.0–0.2)

## 2022-06-06 LAB — RESP PANEL BY RT-PCR (FLU A&B, COVID) ARPGX2
Influenza A by PCR: NEGATIVE
Influenza B by PCR: NEGATIVE
SARS Coronavirus 2 by RT PCR: NEGATIVE

## 2022-06-06 NOTE — ED Triage Notes (Addendum)
BIB EMS from dialysis.  Has had a non productive cough x 4 days.  Not sleeping well.  Dialysis put him on O2.  2L [place] at 99%.  Pt was de satting, just placed on O2 for comfort.  He is on amoxicillin x 2 days.  150/90.  2 hours 20 min of dialysis but then was coughing too much to continue.

## 2022-06-06 NOTE — ED Notes (Signed)
Pt removed from O2 and sats remained 100%

## 2022-06-06 NOTE — ED Provider Triage Note (Signed)
Emergency Medicine Provider Triage Evaluation Note  Jesus Ayala , a 71 y.o. male  was evaluated in triage.  Pt complains of nonproductive cough patient reports he has a nonproductive cough for the last 4 days.  Patient states that he was at dialysis cannot take his cough anymore.  Patient came to the emergency department for further evaluation.  Patient reports that he did start antibiotics prescribed for his cough.  Patient denies having any chest x-ray.  Patient also endorses diarrhea however this started after he was placed on antibiotics.  Review of Systems  Positive: Nonproductive cough diarrhea, not sleeping well Negative: Fever, chills, nausea, vomiting, blood in stool, melena, chest pain  Physical Exam  BP (!) 144/79 (BP Location: Left Arm)   Pulse 70   Temp 98.1 F (36.7 C)   Resp 14   SpO2 100%  Gen:   Awake, no distress ,  Resp:  Normal effort, clear to auscultation bilaterally; speaks in focally sentences without difficulty MSK:   Moves extremities without difficulty; no swelling or tenderness bilaterally  Other:    Medical Decision Making  Medically screening exam initiated at 8:54 AM.  Appropriate orders placed.  Hope Pigeon Kennard was informed that the remainder of the evaluation will be completed by another provider, this initial triage assessment does not replace that evaluation, and the importance of remaining in the ED until their evaluation is complete.  Oxygen saturation 100% on room air.     Loni Beckwith, [place] 06/06/22 [phone]

## 2022-06-07 NOTE — ED Notes (Signed)
Pt left AMA °

## 2022-08-14 ENCOUNTER — Emergency Department (HOSPITAL_BASED_OUTPATIENT_CLINIC_OR_DEPARTMENT_OTHER): Payer: Medicare HMO

## 2022-08-14 ENCOUNTER — Emergency Department (HOSPITAL_BASED_OUTPATIENT_CLINIC_OR_DEPARTMENT_OTHER)
Admission: EM | Admit: 2022-08-14 | Discharge: 2022-08-15 | Discharge status: Against Medical Advice | Payer: Medicare HMO | Attending: Emergency Medicine | Admitting: Emergency Medicine

## 2022-08-14 ENCOUNTER — Other Ambulatory Visit: Payer: Self-pay

## 2022-08-14 ENCOUNTER — Encounter (HOSPITAL_BASED_OUTPATIENT_CLINIC_OR_DEPARTMENT_OTHER): Payer: Self-pay | Admitting: Pediatrics

## 2022-08-14 DIAGNOSIS — R059 Cough, unspecified: Secondary | ICD-10-CM | POA: Diagnosis not present

## 2022-08-14 DIAGNOSIS — Z20822 Contact with and (suspected) exposure to covid-19: Secondary | ICD-10-CM | POA: Insufficient documentation

## 2022-08-14 DIAGNOSIS — Z5321 Procedure and treatment not carried out due to patient leaving prior to being seen by health care provider: Secondary | ICD-10-CM | POA: Insufficient documentation

## 2022-08-14 DIAGNOSIS — Z992 Dependence on renal dialysis: Secondary | ICD-10-CM | POA: Insufficient documentation

## 2022-08-14 DIAGNOSIS — N186 End stage renal disease: Secondary | ICD-10-CM | POA: Diagnosis not present

## 2022-08-14 DIAGNOSIS — Z7982 Long term (current) use of aspirin: Secondary | ICD-10-CM | POA: Insufficient documentation

## 2022-08-14 DIAGNOSIS — H532 Diplopia: Secondary | ICD-10-CM | POA: Diagnosis present

## 2022-08-14 DIAGNOSIS — Z1152 Encounter for screening for COVID-19: Secondary | ICD-10-CM | POA: Insufficient documentation

## 2022-08-14 LAB — BASIC METABOLIC PANEL
Anion gap: 13 (ref 5–15)
BUN: 39 mg/dL — ABNORMAL HIGH (ref 8–23)
CO2: 27 mmol/L (ref 22–32)
Calcium: 7.9 mg/dL — ABNORMAL LOW (ref 8.9–10.3)
Chloride: 95 mmol/L — ABNORMAL LOW (ref 98–111)
Creatinine, Ser: 11.05 mg/dL — ABNORMAL HIGH (ref 0.61–1.24)
GFR, Estimated: 5 mL/min — ABNORMAL LOW (ref >60)
Glucose, Bld: 94 mg/dL (ref 70–99)
Potassium: 4 mmol/L (ref 3.5–5.1)
Sodium: 135 mmol/L (ref 135–145)

## 2022-08-14 LAB — CBC WITH DIFFERENTIAL/PLATELET
Abs Immature Granulocytes: 0.01 10*3/uL (ref 0.00–0.07)
Basophils Absolute: 0 10*3/uL (ref 0.0–0.1)
Basophils Relative: 1 %
Eosinophils Absolute: 0.2 10*3/uL (ref 0.0–0.5)
Eosinophils Relative: 3 %
HCT: 32.7 % — ABNORMAL LOW (ref 39.0–52.0)
Hemoglobin: 10.7 g/dL — ABNORMAL LOW (ref 13.0–17.0)
Immature Granulocytes: 0 %
Lymphocytes Relative: 28 %
Lymphs Abs: 2.1 10*3/uL (ref 0.7–4.0)
MCH: 29 pg (ref 26.0–34.0)
MCHC: 32.7 g/dL (ref 30.0–36.0)
MCV: 88.6 fL (ref 80.0–100.0)
Monocytes Absolute: 0.9 10*3/uL (ref 0.1–1.0)
Monocytes Relative: 12 %
Neutro Abs: 4.2 10*3/uL (ref 1.7–7.7)
Neutrophils Relative %: 56 %
Platelets: 240 10*3/uL (ref 150–400)
RBC: 3.69 MIL/uL — ABNORMAL LOW (ref 4.22–5.81)
RDW: 18.5 % — ABNORMAL HIGH (ref 11.5–15.5)
WBC: 7.3 10*3/uL (ref 4.0–10.5)
nRBC: 0 % (ref 0.0–0.2)

## 2022-08-14 LAB — RESP PANEL BY RT-PCR (FLU A&B, COVID) ARPGX2
Influenza A by PCR: NEGATIVE
Influenza B by PCR: NEGATIVE
SARS Coronavirus 2 by RT PCR: NEGATIVE

## 2022-08-14 NOTE — Discharge Instructions (Addendum)
Please go to emergency department at [facility] for MRI of the brain to rule out a stroke. The MRI has been ordered.

## 2022-08-14 NOTE — ED Triage Notes (Signed)
C/o cough x 4 weeks, dry in nature; was seen by PCP and has RX for it but its not helping.  C/O double vision since yesterday,

## 2022-08-14 NOTE — ED Provider Notes (Signed)
[facility] EMERGENCY DEPARTMENT Provider Note   CSN: [id number] Arrival date & time: 08/14/22  1803     History {Add pertinent medical, surgical, social history, OB history to HPI:1} Chief Complaint  Patient presents with  . Cough    Jesus Ayala is a 71 y.o. male.  Patient presents to the emergency department today for evaluation of double vision.  Symptoms started yesterday.  He wears corrective lenses but has not had double vision like this before.  It is worse when he looks upward to the left or right.  Patient denies signs of stroke including: facial droop, slurred speech, aphasia, weakness/numbness in extremities, imbalance/trouble walking.  No loss of vision.  In addition he has had cough ongoing for 4 weeks.  He states that he was provided with codeine cough syrup for cough but cough continues.  No fevers, chest pain, shortness of breath.  He has a history of ESRD.  Dialyzes Monday, Wednesday, Friday.  Last dialyzed yesterday.  Dialyzes through right upper extremity access.      Home Medications Prior to Admission medications   Medication Sig Start Date End Date Taking? Authorizing Provider  acetaminophen (TYLENOL) 500 MG tablet Take 1,000 mg by mouth every 6 (six) hours as needed (pain).     [provider]  aspirin 81 MG chewable tablet Chew 81 mg by mouth every other day.     [provider]  B Complex-C-Zn-Folic Acid (DIALYVITE 637-CHYI 15 PO) Take 1 tablet by mouth daily at 3 pm.    [provider]  calcium acetate (PHOSLO) 667 MG capsule Take 1 capsule (667 mg total) by mouth 3 (three) times daily with meals. Patient taking differently: Take 667 mg by mouth See admin instructions. Take 667 mg with each meal and snack 04/22/19   Lavina Hamman, MD  diphenhydrAMINE (BENADRYL) 25 MG tablet Take 25 mg by mouth every other day. Allergies/ itching    [provider]  Ensure (ENSURE) Take 237 mLs by mouth daily as needed  (supplement).    [provider]  famotidine (PEPCID) 20 MG tablet Take 1 tablet (20 mg total) by mouth 2 (two) times daily. 06/08/20   Nuala Alpha A, PA-C  fluticasone (FLONASE) 50 MCG/ACT nasal spray Place 1 spray into both nostrils daily as needed for allergies.  01/14/20   [provider]  gabapentin (NEURONTIN) 300 MG capsule Take 300 mg by mouth 2 (two) times daily as needed (pain).  01/14/20   [provider]  hydrALAZINE (APRESOLINE) 25 MG tablet Take 1 tablet (25 mg total) by mouth 3 (three) times daily. 04/22/19   Lavina Hamman, MD  HYDROcodone-acetaminophen (NORCO) 5-325 MG tablet Take 1 tablet by mouth every 6 (six) hours as needed for moderate pain. 06/28/20   Dagoberto Ligas, PA-C  hydrocortisone cream 0.5 % Apply 1 application topically daily as needed for itching.    [provider]  latanoprost (XALATAN) 0.005 % ophthalmic solution Place 1 drop into both eyes at bedtime.    [provider]  lidocaine-prilocaine (EMLA) cream Apply 1 application topically as needed (port access).     [provider]  nitroGLYCERIN (NITROSTAT) 0.4 MG SL tablet Place 0.4 mg under the tongue every 5 (five) minutes as needed for chest pain.  03/27/19   [provider]  pantoprazole (PROTONIX) 20 MG tablet Take 1 tablet (20 mg total) by mouth daily. 06/08/20   Nuala Alpha A, PA-C  pantoprazole (PROTONIX) 40 MG tablet Take  40 mg by mouth daily.    [provider]  sodium chloride (OCEAN) 0.65 % SOLN nasal spray Place 1 spray into both nostrils as needed for congestion.    [provider]  traZODone (DESYREL) 100 MG tablet Take 100 mg by mouth at bedtime.  04/02/20   [provider]  triamcinolone ointment (KENALOG) 0.1 % SMARTSIG:1 Application Topical 2-3 Times Daily 03/26/22   [provider]      Allergies    Ace inhibitors, Enalapril, Iron sucrose, and Venofer  [ferric oxide]    Review of Systems    Review of Systems  Physical Exam Updated Vital Signs BP (!) 149/81 (BP Location: Left Arm)   Pulse 85   Temp 98.1 F (36.7 C) (Oral)   Resp 18   Ht 5\' 11"  (1.803 m)   Wt 83.9 kg   SpO2 100%   BMI 25.80 kg/m   Physical Exam Vitals and nursing note reviewed.  Constitutional:      Appearance: He is well-developed.  HENT:     Head: Normocephalic and atraumatic.     Right Ear: External ear normal.     Left Ear: External ear normal.     Nose: Nose normal.     Mouth/Throat:     Pharynx: Uvula midline.  Eyes:     General: Lids are normal.     Conjunctiva/sclera: Conjunctivae normal.     Pupils: Pupils are equal, round, and reactive to light.  Cardiovascular:     Rate and Rhythm: Normal rate and regular rhythm.  Pulmonary:     Effort: Pulmonary effort is normal.     Breath sounds: Normal breath sounds.  Abdominal:     Palpations: Abdomen is soft.     Tenderness: There is no abdominal tenderness.  Musculoskeletal:        General: Normal range of motion.     Cervical back: Normal range of motion and neck supple. No tenderness or bony tenderness.     Comments: Left upper extremity dialysis access no longer working, no palpable thrill.  Skin:    General: Skin is warm and dry.  Neurological:     Mental Status: He is alert and oriented to person, place, and time.     GCS: GCS eye subscore is 4. GCS verbal subscore is 5. GCS motor subscore is 6.     Cranial Nerves: No cranial nerve deficit.     Sensory: No sensory deficit.     Motor: No abnormal muscle tone.     Coordination: Coordination normal.     Gait: Gait normal.     Comments: Patient reports double vision with leftward and rightward gaze, upward gaze.  EOMs appear intact, I do not see any signs of a subtle ophthalmoplegia.    ED Results / Procedures / Treatments   Labs (all labs ordered are listed, but only abnormal results are displayed) Labs Reviewed  CBC WITH DIFFERENTIAL/PLATELET - Abnormal; Notable for the  following components:      Result Value   RBC 3.69 (*)    Hemoglobin 10.7 (*)    HCT 32.7 (*)    RDW 18.5 (*)    All other components within normal limits  BASIC METABOLIC PANEL - Abnormal; Notable for the following components:   Chloride 95 (*)    BUN 39 (*)    Creatinine, Ser 11.05 (*)    Calcium 7.9 (*)    GFR, Estimated 5 (*)    All other components within normal  limits  RESP PANEL BY RT-PCR (FLU A&B, COVID) [id number]    EKG None  Radiology DG Chest 2 View  Result Date: 08/14/2022 CLINICAL DATA:  Cough EXAM: CHEST - 2 VIEW COMPARISON:  Chest 06/06/2022 FINDINGS: Elevated left hemidiaphragm unchanged. Heart size and vascularity normal. Left coronary stent. Lungs are clear without infiltrate or effusion. No interval change. IMPRESSION: No active cardiopulmonary disease. Electronically Signed   By: Franchot Gallo M.D.   On: 08/14/2022 18:45    Procedures Procedures  {Document cardiac monitor, telemetry assessment procedure when appropriate:1}  Medications Ordered in ED Medications - No data to display  ED Course/ Medical Decision Making/ A&P    Patient seen and examined. History obtained directly from patient.   Labs/EKG: Ordered CBC, BMP.  Imaging: Ordered CT head.  Personally reviewed and interpreted chest x-ray, agree no pneumonia.  Medications/Fluids: None ordered  Most recent vital signs reviewed and are as follows: BP (!) 149/81 (BP Location: Left Arm)   Pulse 85   Temp 98.1 F (36.7 C) (Oral)   Resp 18   Ht 5\' 11"  (1.803 m)   Wt 83.9 kg   SpO2 100%   BMI 25.80 kg/m   Initial impression: Binocular diplopia, chronic cough.                            Medical Decision Making Amount and/or Complexity of Data Reviewed Labs: ordered. Radiology: ordered.   ***  {Document critical care time when appropriate:1} {Document review of labs and clinical decision tools ie heart score, Chads2Vasc2 etc:1}  {Document your independent review of radiology  images, and any outside records:1} {Document your discussion with family members, caretakers, and with consultants:1} {Document social determinants of health affecting pt's care:1} {Document your decision making why or why not admission, treatments were needed:1} Final Clinical Impression(s) / ED Diagnoses Final diagnoses:  None    Rx / DC Orders ED Discharge Orders     None

## 2022-08-14 NOTE — ED Notes (Signed)
Patient transported to CT 

## 2022-08-14 NOTE — ED Notes (Signed)
Still in radiology, vitals will be updated upon return to room

## 2022-08-14 NOTE — ED Notes (Signed)
ED Provider at bedside. 

## 2022-08-15 ENCOUNTER — Emergency Department (HOSPITAL_COMMUNITY): Payer: Medicare HMO

## 2022-08-15 NOTE — ED Notes (Signed)
Notified MRI staff of pt transfer from [facility] for MRI procedure

## 2022-08-15 NOTE — ED Notes (Signed)
Notified Claiborne Billings, RN ED charge nurse of pt transfer for MRI

## 2022-08-15 NOTE — ED Notes (Signed)
Patient states the results will be sent to [facility] and he is leaving. IV removed by this NT.

## 2022-11-11 ENCOUNTER — Emergency Department (HOSPITAL_BASED_OUTPATIENT_CLINIC_OR_DEPARTMENT_OTHER): Payer: Medicare HMO

## 2022-11-11 ENCOUNTER — Other Ambulatory Visit: Payer: Self-pay

## 2022-11-11 ENCOUNTER — Encounter (HOSPITAL_BASED_OUTPATIENT_CLINIC_OR_DEPARTMENT_OTHER): Payer: Self-pay | Admitting: Emergency Medicine

## 2022-11-11 ENCOUNTER — Emergency Department (HOSPITAL_BASED_OUTPATIENT_CLINIC_OR_DEPARTMENT_OTHER)
Admission: EM | Admit: 2022-11-11 | Discharge: 2022-11-11 | Disposition: A | Discharge status: Home / Self Care | Payer: Medicare HMO | Attending: Emergency Medicine | Admitting: Emergency Medicine

## 2022-11-11 DIAGNOSIS — Z20822 Contact with and (suspected) exposure to covid-19: Secondary | ICD-10-CM | POA: Diagnosis not present

## 2022-11-11 DIAGNOSIS — K2900 Acute gastritis without bleeding: Secondary | ICD-10-CM | POA: Insufficient documentation

## 2022-11-11 DIAGNOSIS — K2100 Gastro-esophageal reflux disease with esophagitis, without bleeding: Secondary | ICD-10-CM | POA: Insufficient documentation

## 2022-11-11 DIAGNOSIS — Z79899 Other long term (current) drug therapy: Secondary | ICD-10-CM | POA: Insufficient documentation

## 2022-11-11 DIAGNOSIS — R109 Unspecified abdominal pain: Secondary | ICD-10-CM | POA: Diagnosis present

## 2022-11-11 DIAGNOSIS — N186 End stage renal disease: Secondary | ICD-10-CM | POA: Insufficient documentation

## 2022-11-11 DIAGNOSIS — Z7982 Long term (current) use of aspirin: Secondary | ICD-10-CM | POA: Diagnosis not present

## 2022-11-11 DIAGNOSIS — I120 Hypertensive chronic kidney disease with stage 5 chronic kidney disease or end stage renal disease: Secondary | ICD-10-CM | POA: Diagnosis not present

## 2022-11-11 DIAGNOSIS — Z992 Dependence on renal dialysis: Secondary | ICD-10-CM | POA: Insufficient documentation

## 2022-11-11 LAB — HEPATIC FUNCTION PANEL
ALT: 18 U/L (ref 0–44)
AST: 21 U/L (ref 15–41)
Albumin: 3.9 g/dL (ref 3.5–5.0)
Alkaline Phosphatase: 84 U/L (ref 38–126)
Bilirubin, Direct: <0.1 mg/dL (ref 0.0–0.2)
Total Bilirubin: 0.9 mg/dL (ref 0.3–1.2)
Total Protein: 8.6 g/dL — ABNORMAL HIGH (ref 6.5–8.1)

## 2022-11-11 LAB — URINALYSIS, ROUTINE W REFLEX MICROSCOPIC
Bilirubin Urine: NEGATIVE
Glucose, UA: NEGATIVE mg/dL
Ketones, ur: NEGATIVE mg/dL
Nitrite: NEGATIVE
Protein, ur: >=300 mg/dL — AB
Specific Gravity, Urine: 1.02 (ref 1.005–1.030)
pH: 8.5 — ABNORMAL HIGH (ref 5.0–8.0)

## 2022-11-11 LAB — CBC
HCT: 31.7 % — ABNORMAL LOW (ref 39.0–52.0)
Hemoglobin: 10.5 g/dL — ABNORMAL LOW (ref 13.0–17.0)
MCH: 31.1 pg (ref 26.0–34.0)
MCHC: 33.1 g/dL (ref 30.0–36.0)
MCV: 93.8 fL (ref 80.0–100.0)
Platelets: 311 10*3/uL (ref 150–400)
RBC: 3.38 MIL/uL — ABNORMAL LOW (ref 4.22–5.81)
RDW: 16.1 % — ABNORMAL HIGH (ref 11.5–15.5)
WBC: 8 10*3/uL (ref 4.0–10.5)
nRBC: 0 % (ref 0.0–0.2)

## 2022-11-11 LAB — BASIC METABOLIC PANEL
Anion gap: 15 (ref 5–15)
BUN: 30 mg/dL — ABNORMAL HIGH (ref 8–23)
CO2: 30 mmol/L (ref 22–32)
Calcium: 9.3 mg/dL (ref 8.9–10.3)
Chloride: 88 mmol/L — ABNORMAL LOW (ref 98–111)
Creatinine, Ser: 12.15 mg/dL — ABNORMAL HIGH (ref 0.61–1.24)
GFR, Estimated: 4 mL/min — ABNORMAL LOW (ref >60)
Glucose, Bld: 88 mg/dL (ref 70–99)
Potassium: 3.7 mmol/L (ref 3.5–5.1)
Sodium: 133 mmol/L — ABNORMAL LOW (ref 135–145)

## 2022-11-11 LAB — RESP PANEL BY RT-PCR (RSV, FLU A&B, COVID)  RVPGX2
Influenza A by PCR: NEGATIVE
Influenza B by PCR: NEGATIVE
Resp Syncytial Virus by PCR: NEGATIVE
SARS Coronavirus 2 by RT PCR: NEGATIVE

## 2022-11-11 LAB — URINALYSIS, MICROSCOPIC (REFLEX)

## 2022-11-11 LAB — TROPONIN I (HIGH SENSITIVITY)
Troponin I (High Sensitivity): 4 ng/L (ref <18)
Troponin I (High Sensitivity): 4 ng/L (ref <18)

## 2022-11-11 LAB — LIPASE, BLOOD: Lipase: 42 U/L (ref 11–51)

## 2022-11-11 MED ORDER — ONDANSETRON HCL 4 MG PO TABS: 4.0000 mg | ORAL_TABLET | Freq: Four times a day (QID) | ORAL | 0 refills | Status: AC | PRN

## 2022-11-11 MED ORDER — ONDANSETRON HCL 4 MG/2ML IJ SOLN
4.0000 mg | Freq: Once | INTRAMUSCULAR | Status: AC
  Administered 2022-11-11: 4 mg via INTRAVENOUS
  Filled 2022-11-11: qty 2

## 2022-11-11 MED ORDER — ALUM & MAG HYDROXIDE-SIMETH 200-200-20 MG/5ML PO SUSP
30.0000 mL | Freq: Once | ORAL | Status: AC
  Administered 2022-11-11: 30 mL via ORAL
  Filled 2022-11-11: qty 30

## 2022-11-11 MED ORDER — PANTOPRAZOLE SODIUM 40 MG PO TBEC: 40.0000 mg | DELAYED_RELEASE_TABLET | Freq: Every day | ORAL | 0 refills | Status: AC

## 2022-11-11 MED ORDER — PANTOPRAZOLE SODIUM 40 MG PO TBEC: 40.0000 mg | DELAYED_RELEASE_TABLET | Freq: Every day | ORAL | 0 refills | Status: DC

## 2022-11-11 NOTE — Discharge Instructions (Addendum)
You came to the emergency department today with abdominal pain.  Your CT scan shows gastritis.  Please see the information about this attached to these discharge papers.  You may use Maalox for your symptoms which is over-the-counter.  I am prescribing a medication called pantoprazole and it appears you have been prescribed this in the past.  Be sure to take it in the morning on an empty stomach and do not eat or drink for 30 minutes after.  The other medication at your pharmacy is Zofran, this is for the nausea.  Ultimately follow-up with a gastroenterologist if you continue to have symptoms past the next 2 weeks.  Look out for phone call from them within the next 72 hours.  You may also follow-up with your primary care show and for suggestions and referrals.  Do not hesitate to return to the emergency department with any recurring or worsening symptoms.  Continue your dialysis as planned.

## 2022-11-11 NOTE — ED Triage Notes (Signed)
Pt arrives pov, steady gait with c/o epigastric pain  with indigestion, worse after eating x 3 days. Denies CP or fever

## 2022-11-11 NOTE — ED Provider Notes (Signed)
[place] [facility]ERG[facility]PART[facility] AT [facility] Provider Note   CSN: [id number] Arrival date & time: 11/11/22  1156     History  Chief Complaint  Patient presents with   Abdominal Pain    Jesus Ayala is a 72 y.o. male with past medical history of hypertension and ESRD presenting today with abdominal pain.  He reports for the past 3 to 4 days he has been having left upper and epigastric abdominal tenderness.  Also says that he feels like all of his food is not going down.  Describes it as a flare of his reflux.  No known sick contacts but does report some nausea, 1 episode of emesis and mild headache.  Also says that he took Tylenol at 1 point which helped him as well as TheraFlu which also helped him.  Of note, patient is on the kidney transplant list.  He has not missed any dialysis sessions.    Abdominal Pain Associated symptoms: nausea, sore throat and vomiting   Associated symptoms: no chest pain, no fever and no shortness of breath        Home Medications Prior to Admission medications   Medication Sig Start Date End Date Taking? Authorizing Provider  acetaminophen (TYLENOL) 500 MG tablet Take 1,000 mg by mouth every 6 (six) hours as needed (pain).     [provider]  aspirin 81 MG chewable tablet Chew 81 mg by mouth every other day.     [provider]  B Complex-C-Zn-Folic Acid (DIALYVITE 017-PZWC 15 PO) Take 1 tablet by mouth daily at 3 pm.    [provider]  calcium acetate (PHOSLO) 667 MG capsule Take 1 capsule (667 mg total) by mouth 3 (three) times daily with meals. Patient taking differently: Take 667 mg by mouth See admin instructions. Take 667 mg with each meal and snack 04/22/19   Lavina Hamman, MD  diphenhydrAMINE (BENADRYL) 25 MG tablet Take 25 mg by mouth every other day. Allergies/ itching    [provider]  Ensure (ENSURE) Take 237 mLs by mouth daily as needed (supplement).    [provider]   famotidine (PEPCID) 20 MG tablet Take 1 tablet (20 mg total) by mouth 2 (two) times daily. 06/08/20   Nuala Alpha A, PA-C  fluticasone (FLONASE) 50 MCG/ACT nasal spray Place 1 spray into both nostrils daily as needed for allergies.  01/14/20   [provider]  gabapentin (NEURONTIN) 300 MG capsule Take 300 mg by mouth 2 (two) times daily as needed (pain).  01/14/20   [provider]  hydrALAZINE (APRESOLINE) 25 MG tablet Take 1 tablet (25 mg total) by mouth 3 (three) times daily. 04/22/19   Lavina Hamman, MD  hydrocortisone cream 0.5 % Apply 1 application topically daily as needed for itching.    [provider]  latanoprost (XALATAN) 0.005 % ophthalmic solution Place 1 drop into both eyes at bedtime.    [provider]  lidocaine-prilocaine (EMLA) cream Apply 1 application topically as needed (port access).     [provider]  nitroGLYCERIN (NITROSTAT) 0.4 MG SL tablet Place 0.4 mg under the tongue every 5 (five) minutes as needed for chest pain.  03/27/19   [provider]  pantoprazole (PROTONIX) 20 MG tablet Take 1 tablet (20 mg total) by mouth daily. 06/08/20   Nuala Alpha A, PA-C  pantoprazole (PROTONIX) 40 MG tablet Take 40 mg by mouth daily.    [provider]  triamcinolone ointment (  KENALOG) 0.1 % SMARTSIG:1 Application Topical 2-3 Times Daily 03/26/22   [provider]      Allergies    Ace inhibitors, Enalapril, Iron sucrose, and Venofer  [ferric oxide]    Review of Systems   Review of Systems  Constitutional:  Negative for fever.  HENT:  Positive for sore throat.   Respiratory:  Negative for shortness of breath.   Cardiovascular:  Negative for chest pain.  Gastrointestinal:  Positive for abdominal pain, nausea and vomiting.  Neurological:  Positive for headaches.    Physical Exam Updated Vital Signs BP (!) 153/81 (BP Location: Left Arm)   Pulse 82   Temp 98.8 F (37.1 C)   Resp 18   Ht 5\' 11"   (1.803 m)   Wt 81.6 kg   SpO2 98%   BMI 25.10 kg/m  Physical Exam Vitals and nursing note reviewed.  Constitutional:      Appearance: Normal appearance.  HENT:     Head: Normocephalic and atraumatic.  Eyes:     General: No scleral icterus.    Conjunctiva/sclera: Conjunctivae normal.  Pulmonary:     Effort: Pulmonary effort is normal. No respiratory distress.  Abdominal:     General: Abdomen is flat.     Palpations: Abdomen is soft.     Tenderness: There is abdominal tenderness in the left upper quadrant and left lower quadrant.  Skin:    Findings: No rash.  Neurological:     Mental Status: He is alert.  Psychiatric:        Mood and Affect: Mood normal.     ED Results / Procedures / Treatments   Labs (all labs ordered are listed, but only abnormal results are displayed) Labs Reviewed  BASIC METABOLIC PANEL - Abnormal; Notable for the following components:      Result Value   Sodium 133 (*)    Chloride 88 (*)    BUN 30 (*)    Creatinine, Ser 12.15 (*)    GFR, Estimated 4 (*)    All other components within normal limits  CBC - Abnormal; Notable for the following components:   RBC 3.38 (*)    Hemoglobin 10.5 (*)    HCT 31.7 (*)    RDW 16.1 (*)    All other components within normal limits  URINALYSIS, ROUTINE W REFLEX MICROSCOPIC - Abnormal; Notable for the following components:   pH 8.5 (*)    Hgb urine dipstick TRACE (*)    Protein, ur >=300 (*)    Leukocytes,Ua TRACE (*)    All other components within normal limits  URINALYSIS, MICROSCOPIC (REFLEX) - Abnormal; Notable for the following components:   Bacteria, UA FEW (*)    All other components within normal limits  HEPATIC FUNCTION PANEL - Abnormal; Notable for the following components:   Total Protein 8.6 (*)    All other components within normal limits  RESP PANEL BY RT-PCR (RSV, FLU A&B, COVID)  RVPGX2  LIPASE, BLOOD  TROPONIN I (HIGH SENSITIVITY)  TROPONIN I (HIGH SENSITIVITY)     EKG None  Radiology CT ABDOMEN PELVIS WO CONTRAST  Result Date: 11/11/2022 CLINICAL DATA:  Abdominal pain.  Epigastric pain. EXAM: CT ABDOMEN AND PELVIS WITHOUT CONTRAST TECHNIQUE: Multidetector CT imaging of the abdomen and pelvis was performed following the standard protocol without IV contrast. RADIATION DOSE REDUCTION: This exam was performed according to the departmental dose-optimization program which includes automated exposure control, adjustment of the mA and/or kV according to patient size and/or use of  iterative reconstruction technique. COMPARISON:  CT abdomen pelvis 06/27/2021 FINDINGS: Lower chest: Normal heart size. Dependent atelectasis within the left lower lobe greater than the right lower lobe. No pleural effusion. Hepatobiliary: Liver is normal in size and contour. Prior cholecystectomy. No intrahepatic or extrahepatic biliary ductal dilatation. Pancreas: Unremarkable Spleen: Unremarkable Adrenals/Urinary Tract: Normal adrenal glands. Atrophic kidneys bilaterally. No hydronephrosis. Urinary bladder is decompressed. Stomach/Bowel: Normal appendix. No abnormal bowel wall thickening or evidence for bowel obstruction. No free fluid or free intraperitoneal air. Circumferential wall thickening of the distal esophagus. Additionally there is thickening of the gastric wall adjacent to the GE junction (image 15; series 2). Vascular/Lymphatic: Normal caliber abdominal aorta. Peripheral calcified atherosclerotic plaque. No retroperitoneal lymphadenopathy. Reproductive: Heterogeneous prostate Other: Small bowel containing right inguinal hernia without evidence for obstruction. Soft tissue density material adjacent to the left inguinal canal (image 78; series 2), stable when compared to prior exams, potentially from prior left inguinal hernia repair. Musculoskeletal: Lumbar spine degenerative changes. No aggressive or acute appearing osseous lesions. IMPRESSION: 1. There is circumferential wall  thickening of the distal esophagus as well as thickening of the gastric wall adjacent to the GE junction. Findings may be secondary to esophagitis and gastritis. Recommend correlation with upper endoscopy, if not recently performed, to exclude the possibility of underlying mass. 2. Small bowel containing right inguinal hernia without evidence for obstruction. Electronically Signed   By: Lovey Newcomer M.D.   On: 11/11/2022 15:51    Procedures Procedures   Medications Ordered in ED Medications - No data to display  ED Course/ Medical Decision Making/ A&P                             Medical Decision Making Amount and/or Complexity of Data Reviewed Labs: ordered. Radiology: ordered.  Risk OTC drugs. Prescription drug management.   72 year old male presenting with abdominal pain. The differential diagnosis for generalized abdominal pain includes, but is not limited to AAA, gastroenteritis, appendicitis, Bowel obstruction, Bowel perforation. Gastroparesis, DKA, Hernia, Inflammatory bowel disease, mesenteric ischemia, pancreatitis, peritonitis SBP, volvulus.   This is not an exhaustive differential.    Past Medical History / Co-morbidities / Social History: ESRD, GERD   Additional history: Per chart review patient has been seen multiple times at outpatient facilities for paraesophageal hiatal hernias.  It appears he is supposed to be scheduled for an endoscopy which has not been done yet.  Also has had numerous visits with PCP for GERD   Physical Exam: Pertinent physical exam findings include Generalized abdominal tenderness, most severe in the left upper and left lower quadrants  Lab Tests: I ordered, and personally interpreted labs.  The pertinent results include: Chemistry and urinalysis consistent with patient's known kidney function.  Very mild hyponatremia, will not fluid bolus secondary to kidney disease Negative flu, COVID and RSV   Imaging Studies: CT Noncon ordered, viewed  and interpreted by me.  I agree with radiology that the only relevant finding is likely esophagitis/gastritis.     Medications: Maalox and Zofran.  On reevaluation patient reports feeling much better   MDM/Disposition: This is a 72 year old male who presented today with epigastric and left upper quadrant pain.  Differential was broad.  He did have some left lower quadrant pain so I obtained CT imaging.  Patient has a history of ESRD on dialysis so the CT was without contrast.  No signs of diverticulitis.  There was gastritis/esophagitis.  Per chart review patient has  an extensive history of a hiatal hernia and GERD.  He tells me he no longer takes a PPI.  He felt better after Maalox.  I suspect this is a flare of his GERD and gastritis.  We discussed the possibility of PUD as well and he will need to see GI for this.  He tells me that he has been told that he needs an endoscopy.  I have placed an ambulatory referral for this.  He will be started on a PPI and given dietary instructions for esophagitis, gastritis and GERD.  All of his and his wife's questions were answered and they will be discharged at this time.  They will continue dialysis as scheduled.     Final Clinical Impression(s) / ED Diagnoses Final diagnoses:  Acute gastritis without hemorrhage, unspecified gastritis type  Gastroesophageal reflux disease with esophagitis without hemorrhage    Rx / DC Orders ED Discharge Orders          Ordered    pantoprazole (PROTONIX) 40 MG tablet  Daily,   Status:  Discontinued        11/11/22 1645    Ambulatory referral to Gastroenterology        11/11/22 1649    ondansetron (ZOFRAN) 4 MG tablet  Every 6 hours PRN        11/11/22 1649    pantoprazole (PROTONIX) 40 MG tablet  Daily        11/11/22 1656           Results and diagnoses were explained to the patient. Return precautions discussed in full. Patient had no additional questions and expressed complete understanding.   This  chart was dictated using voice recognition software.  Despite best efforts to proofread,  errors can occur which can change the documentation meaning.    Darliss Ridgel 11/11/22 1912    Cristie Hem, MD 11/11/22 2000

## 2022-11-11 NOTE — ED Notes (Addendum)
Patient does dialysis MWF, last dialysis was 11/09/2022

## 2022-11-28 ENCOUNTER — Emergency Department (HOSPITAL_COMMUNITY): Payer: Medicare HMO

## 2022-11-28 ENCOUNTER — Other Ambulatory Visit: Payer: Self-pay

## 2022-11-28 ENCOUNTER — Emergency Department (HOSPITAL_COMMUNITY)
Admission: EM | Admit: 2022-11-28 | Discharge: 2022-11-28 | Disposition: A | Discharge status: Home / Self Care | Payer: Medicare HMO | Attending: Emergency Medicine | Admitting: Emergency Medicine

## 2022-11-28 DIAGNOSIS — Z23 Encounter for immunization: Secondary | ICD-10-CM | POA: Insufficient documentation

## 2022-11-28 DIAGNOSIS — I10 Essential (primary) hypertension: Secondary | ICD-10-CM | POA: Diagnosis not present

## 2022-11-28 DIAGNOSIS — S0990XA Unspecified injury of head, initial encounter: Secondary | ICD-10-CM | POA: Insufficient documentation

## 2022-11-28 DIAGNOSIS — R040 Epistaxis: Secondary | ICD-10-CM | POA: Diagnosis not present

## 2022-11-28 DIAGNOSIS — Y92094 Garage of other non-institutional residence as the place of occurrence of the external cause: Secondary | ICD-10-CM | POA: Diagnosis not present

## 2022-11-28 DIAGNOSIS — Z7982 Long term (current) use of aspirin: Secondary | ICD-10-CM | POA: Insufficient documentation

## 2022-11-28 DIAGNOSIS — S80211A Abrasion, right knee, initial encounter: Secondary | ICD-10-CM | POA: Diagnosis not present

## 2022-11-28 DIAGNOSIS — W01198A Fall on same level from slipping, tripping and stumbling with subsequent striking against other object, initial encounter: Secondary | ICD-10-CM | POA: Diagnosis not present

## 2022-11-28 DIAGNOSIS — Z79899 Other long term (current) drug therapy: Secondary | ICD-10-CM | POA: Insufficient documentation

## 2022-11-28 DIAGNOSIS — S01511A Laceration without foreign body of lip, initial encounter: Secondary | ICD-10-CM | POA: Diagnosis not present

## 2022-11-28 DIAGNOSIS — S0993XA Unspecified injury of face, initial encounter: Secondary | ICD-10-CM | POA: Diagnosis present

## 2022-11-28 LAB — COMPREHENSIVE METABOLIC PANEL
ALT: 17 U/L (ref 0–44)
AST: 24 U/L (ref 15–41)
Albumin: 3.5 g/dL (ref 3.5–5.0)
Alkaline Phosphatase: 97 U/L (ref 38–126)
Anion gap: 18 — ABNORMAL HIGH (ref 5–15)
BUN: 27 mg/dL — ABNORMAL HIGH (ref 8–23)
CO2: 24 mmol/L (ref 22–32)
Calcium: 10.7 mg/dL — ABNORMAL HIGH (ref 8.9–10.3)
Chloride: 94 mmol/L — ABNORMAL LOW (ref 98–111)
Creatinine, Ser: 12.47 mg/dL — ABNORMAL HIGH (ref 0.61–1.24)
GFR, Estimated: 4 mL/min — ABNORMAL LOW (ref >60)
Glucose, Bld: 96 mg/dL (ref 70–99)
Potassium: 4.3 mmol/L (ref 3.5–5.1)
Sodium: 136 mmol/L (ref 135–145)
Total Bilirubin: 0.4 mg/dL (ref 0.3–1.2)
Total Protein: 7.3 g/dL (ref 6.5–8.1)

## 2022-11-28 LAB — CBC
HCT: 33 % — ABNORMAL LOW (ref 39.0–52.0)
Hemoglobin: 10.5 g/dL — ABNORMAL LOW (ref 13.0–17.0)
MCH: 31.1 pg (ref 26.0–34.0)
MCHC: 31.8 g/dL (ref 30.0–36.0)
MCV: 97.6 fL (ref 80.0–100.0)
Platelets: 251 10*3/uL (ref 150–400)
RBC: 3.38 MIL/uL — ABNORMAL LOW (ref 4.22–5.81)
RDW: 16 % — ABNORMAL HIGH (ref 11.5–15.5)
WBC: 9.9 10*3/uL (ref 4.0–10.5)
nRBC: 0 % (ref 0.0–0.2)

## 2022-11-28 LAB — I-STAT CHEM 8, ED
BUN: 32 mg/dL — ABNORMAL HIGH (ref 8–23)
Calcium, Ion: 1.22 mmol/L (ref 1.15–1.40)
Chloride: 97 mmol/L — ABNORMAL LOW (ref 98–111)
Creatinine, Ser: 13.5 mg/dL — ABNORMAL HIGH (ref 0.61–1.24)
Glucose, Bld: 95 mg/dL (ref 70–99)
HCT: 32 % — ABNORMAL LOW (ref 39.0–52.0)
Hemoglobin: 10.9 g/dL — ABNORMAL LOW (ref 13.0–17.0)
Potassium: 4.4 mmol/L (ref 3.5–5.1)
Sodium: 136 mmol/L (ref 135–145)
TCO2: 32 mmol/L (ref 22–32)

## 2022-11-28 LAB — ETHANOL: Alcohol, Ethyl (B): <10 mg/dL (ref <10)

## 2022-11-28 MED ORDER — TETANUS-DIPHTH-ACELL PERTUSSIS 5-2.5-18.5 LF-MCG/0.5 IM SUSY
0.5000 mL | PREFILLED_SYRINGE | Freq: Once | INTRAMUSCULAR | Status: AC
  Administered 2022-11-28: 0.5 mL via INTRAMUSCULAR
  Filled 2022-11-28: qty 0.5

## 2022-11-28 MED ORDER — LIDOCAINE-EPINEPHRINE-TETRACAINE (LET) TOPICAL GEL: 3.0000 mL | Freq: Once | TOPICAL | Status: DC

## 2022-11-28 MED ORDER — LIDOCAINE-EPINEPHRINE-TETRACAINE (LET) TOPICAL GEL
3.0000 mL | Freq: Once | TOPICAL | Status: AC
  Administered 2022-11-28: 3 mL via TOPICAL
  Filled 2022-11-28: qty 3

## 2022-11-28 MED ORDER — ACETAMINOPHEN 325 MG PO TABS
650.0000 mg | ORAL_TABLET | Freq: Four times a day (QID) | ORAL | Status: DC | PRN
  Administered 2022-11-28: 650 mg via ORAL
  Filled 2022-11-28: qty 2

## 2022-11-28 NOTE — ED Triage Notes (Addendum)
Pt bib [facility] from home where he tripped over the rug in his garage. Pt was on the way to dialysis (MWF). Pt denies LOC, head/neck/back pain. Pt has abrasion to bridge of nose and lac to upper lip. Per EMS pt takes Plavix   EMS vitals: 168/86, 95HR, 97%

## 2022-11-28 NOTE — Discharge Instructions (Signed)
You were seen in the ER today for your facial injuries after your fall.  Fortunately there is no bleeding and your CT scan.  You have broken your nose and should follow-up with ear nose and throat doctor listed below for further management of this, however there is does not appear to be any broken bones in your neck.  Your CT scan of your neck did reveal some degenerative changes in your cervical spine that are causing some narrowing around your spinal cord.  You should follow-up with the spine specialist as below for ongoing evaluation of this.  Please follow-up immediately with them or to the emergency department if you develop any numbness tingling or weakness in your arms.  The sutures that were placed in your lip will dissolve on their own. Follow-up with your primary care doctor or ear nose and throat for reevaluation of this wound.  Return to the ER with any severe symptoms.

## 2022-11-28 NOTE — ED Notes (Signed)
Trauma Response Nurse Documentation   Jesus Ayala is a 72 y.o. male arriving to [facility] ED via EMS  On clopidogrel 75 mg daily per pt for coronary stents placed one year ago. Trauma was activated as a Level 2 by ED charge RN based on the following trauma criteria Elderly patients > 65 with head trauma on anti-coagulation (excluding ASA). Trauma team at the bedside on patient arrival.   Patient cleared for CT by Dr. Alfonso Patten. Sponseller PA-C. Pt transported to CT with trauma response nurse present to monitor. RN remained with the patient throughout their absence from the department for clinical observation.   GCS 15.  History   Past Medical History:  Diagnosis Date   Anemia    Anxiety    situational   Arthritis    Early cataracts, bilateral    ESRD (end stage renal disease) ([place])    MWF- [facility]    GERD (gastroesophageal reflux disease)    Glaucoma    HLD (hyperlipidemia)    Hypertension    Seasonal allergies    Wears glasses    Wears partial dentures      Past Surgical History:  Procedure Laterality Date   A/V FISTULAGRAM Right 06/03/2020   Procedure: A/V FISTULAGRAM;  Surgeon: Elam Dutch, MD;  Location: [facility];  Service: Cardiovascular;  Laterality: Right;   AV FISTULA PLACEMENT Left 07/14/2019   Procedure: INSERTION OF ARTERIOVENOUS (AV) GORE-TEX GRAFT ARM;  Surgeon: Angelia Mould, MD;  Location: [facility];  Service: Vascular;  Laterality: Left;   AV FISTULA PLACEMENT Right 02/23/2020   Procedure: RIGHT ARM ARTERIOVENOUS (AV) FISTULA CREATION;  Surgeon: Elam Dutch, MD;  Location: [facility];  Service: Vascular;  Laterality: Right;   AV FISTULA PLACEMENT Right 06/28/2020   Procedure: INSERTION OF RIGHT UPPER ARM ARTERIOVENOUS (AV) GORE-TEX GRAFT, Ligation of Right Brachial/Cephalic artery.;  Surgeon: Elam Dutch, MD;  Location: [facility];  Service: Vascular;  Laterality: Right;   COLONOSCOPY     HERNIA REPAIR     unbicial   INSERTION OF  DIALYSIS CATHETER Right 06/25/2019   Procedure: INSERTION OF TUNNEL DIALYSIS CATHETER;  Surgeon: Waynetta Sandy, MD;  Location: [facility];  Service: Vascular;  Laterality: Right;   IR THROMBECTOMY AV FISTULA W/THROMBOLYSIS/PTA INC/SHUNT/IMG LEFT Left 09/14/2019   IR US GUIDE VASC ACCESS LEFT  09/14/2019   MULTIPLE TOOTH EXTRACTIONS     TEE WITHOUT CARDIOVERSION N/A 08/31/2019   Procedure: TRANSESOPHAGEAL ECHOCARDIOGRAM (TEE);  Surgeon: Pixie Casino, MD;  Location: [facility];  Service: Cardiovascular;  Laterality: N/A;       Initial Focused Assessment (If applicable, or please see trauma documentation): Alert/oriented male presents via EMS after a fall in his home, fell on a rug and landed on his face. Abrasion to right knee, nose laceration and upper lip laceration with bleeding controlled. Airway patent/unobstructed, BS clear No obvious uncontrolled hemorrhage, bleeding to facial wounds controlled GCS 15 PERRLA  CT's Completed:   CT Head, CT Maxillofacial, and CT C-Spine   Interventions:  Trauma lab draw Attempted IV start x2 without success Portable chest and pelvis XRAY CT head, c-spine, maxface LET to lip wound TDAP  Plan for disposition:  Pending workup  Consults completed:  none at the time of this note.  Event Summary: Pt presents via EMS from home after a fall on a rug resulting in facial trauma. No LOC, takes plavix and aspirin, hx stent placement and dialysis MWF. Wounds to nose and upper  lip, bleeding controlled. TDAP updated. Pending workup.   Bedside handoff with ED RN [name].    [name]  Trauma Response RN  Please call TRN at [phone] for further assistance.

## 2022-11-28 NOTE — ED Notes (Signed)
Patient verbalizes understanding of discharge instructions. Opportunity for questioning and answers were provided. Pt discharged from ED. 

## 2022-11-28 NOTE — ED Provider Notes (Signed)
[place] Provider Note   CSN: [id number] Arrival date & time: 11/28/22  0534     History  Chief Complaint  Patient presents with   Jesus Ayala is a 72 y.o. male who is on aspirin and Plavix who presents with concern for mechanical fall at home after tripping on a rug in garage and hitting his face on concrete.  No LOC nausea vomiting blurry double vision since that time.  Level 5 caveat due to acuity of presentation upon arrival.  Patient is also a dialysis patient, last accessed on Monday.  History of hypertension hyperparathyroidism, GERD, hyperlipidemia.  HPI     Home Medications Prior to Admission medications   Medication Sig Start Date End Date Taking? Authorizing Provider  acetaminophen (TYLENOL) 500 MG tablet Take 1,000 mg by mouth every 6 (six) hours as needed (pain).     [provider]  aspirin 81 MG chewable tablet Chew 81 mg by mouth every other day.     [provider]  B Complex-C-Zn-Folic Acid (DIALYVITE 650-PTWS 15 PO) Take 1 tablet by mouth daily at 3 pm.    [provider]  calcium acetate (PHOSLO) 667 MG capsule Take 1 capsule (667 mg total) by mouth 3 (three) times daily with meals. Patient taking differently: Take 667 mg by mouth See admin instructions. Take 667 mg with each meal and snack 04/22/19   Lavina Hamman, MD  diphenhydrAMINE (BENADRYL) 25 MG tablet Take 25 mg by mouth every other day. Allergies/ itching    [provider]  Ensure (ENSURE) Take 237 mLs by mouth daily as needed (supplement).    [provider]  famotidine (PEPCID) 20 MG tablet Take 1 tablet (20 mg total) by mouth 2 (two) times daily. 06/08/20   Nuala Alpha A, PA-C  fluticasone (FLONASE) 50 MCG/ACT nasal spray Place 1 spray into both nostrils daily as needed for allergies.  01/14/20   [provider]  gabapentin (NEURONTIN) 300 MG capsule Take 300 mg by mouth 2 (two)  times daily as needed (pain).  01/14/20   [provider]  hydrALAZINE (APRESOLINE) 25 MG tablet Take 1 tablet (25 mg total) by mouth 3 (three) times daily. 04/22/19   Lavina Hamman, MD  hydrocortisone cream 0.5 % Apply 1 application topically daily as needed for itching.    [provider]  latanoprost (XALATAN) 0.005 % ophthalmic solution Place 1 drop into both eyes at bedtime.    [provider]  lidocaine-prilocaine (EMLA) cream Apply 1 application topically as needed (port access).     [provider]  nitroGLYCERIN (NITROSTAT) 0.4 MG SL tablet Place 0.4 mg under the tongue every 5 (five) minutes as needed for chest pain.  03/27/19   [provider]  ondansetron (ZOFRAN) 4 MG tablet Take 1 tablet (4 mg total) by mouth every 6 (six) hours as needed for nausea or vomiting. 11/11/22   Redwine, Madison A, PA-C  pantoprazole (PROTONIX) 40 MG tablet Take 1 tablet (40 mg total) by mouth daily. 11/11/22   Redwine, Madison A, PA-C  triamcinolone ointment (KENALOG) 0.1 % SMARTSIG:1 Application Topical 2-3 Times Daily 03/26/22   [provider]      Allergies    Ace inhibitors, Enalapril, Iron sucrose, and Venofer  [ferric oxide]    Review of Systems   Review of Systems  Skin:  Positive for wound.  Neurological:  Positive for headaches.  Physical Exam Updated Vital Signs BP (!) 149/84   Pulse 83   Temp 97.9 F (36.6 C) (Tympanic)   Resp 13   Ht 5\' 11"  (1.803 m)   Wt 83.9 kg   SpO2 100%   BMI 25.80 kg/m  Physical Exam Vitals and nursing note reviewed.  Constitutional:      Appearance: He is not ill-appearing or toxic-appearing.  HENT:     Head: Normocephalic and atraumatic.     Right Ear: No hemotympanum.     Left Ear: No hemotympanum.     Nose: Nasal tenderness present. No septal deviation or laceration.     Right Nostril: Epistaxis present. No septal hematoma.     Left Nostril: Epistaxis present. No septal hematoma.       Mouth/Throat:     Mouth: Mucous membranes are moist.     Dentition: Abnormal dentition.     Pharynx: No oropharyngeal exudate or posterior oropharyngeal erythema.      Comments: Patient missing multiple teeth, though he states he has not lost any additional 1 secondary to this fall. Eyes:     General:        Right eye: No discharge.        Left eye: No discharge.     Conjunctiva/sclera: Conjunctivae normal.  Cardiovascular:     Rate and Rhythm: Normal rate and regular rhythm.     Pulses: Normal pulses.     Heart sounds: Normal heart sounds. No murmur heard. Pulmonary:     Effort: Pulmonary effort is normal. No tachypnea, bradypnea, accessory muscle usage, prolonged expiration or respiratory distress.     Breath sounds: Normal breath sounds. No wheezing or rales.  Chest:     Chest wall: No mass, lacerations, deformity, swelling, tenderness, crepitus or edema.  Abdominal:     General: Bowel sounds are normal. There is no distension.     Palpations: Abdomen is soft.     Tenderness: There is no abdominal tenderness. There is no right CVA tenderness, left CVA tenderness, guarding or rebound.  Musculoskeletal:        General: No deformity.     Cervical back: Normal range of motion and neck supple. No crepitus. No pain with movement, spinous process tenderness or muscular tenderness.     Right lower leg: No edema.     Left lower leg: No edema.  Skin:    General: Skin is warm and dry.     Capillary Refill: Capillary refill takes less than 2 seconds.     Findings: Abrasion and laceration present.       Neurological:     General: No focal deficit present.     Mental Status: He is alert and oriented to person, place, and time. Mental status is at baseline.     GCS: GCS eye subscore is 4. GCS verbal subscore is 5. GCS motor subscore is 6.  Psychiatric:        Mood and Affect: Mood normal.     ED Results / Procedures / Treatments   Labs (all labs ordered are listed, but only abnormal  results are displayed) Labs Reviewed  COMPREHENSIVE METABOLIC PANEL - Abnormal; Notable for the following components:      Result Value   Chloride 94 (*)    BUN 27 (*)    Creatinine, Ser 12.47 (*)    Calcium 10.7 (*)    GFR, Estimated 4 (*)    Anion gap 18 (*)    All other components within  normal limits  CBC - Abnormal; Notable for the following components:   RBC 3.38 (*)    Hemoglobin 10.5 (*)    HCT 33.0 (*)    RDW 16.0 (*)    All other components within normal limits  I-STAT CHEM 8, ED - Abnormal; Notable for the following components:   Chloride 97 (*)    BUN 32 (*)    Creatinine, Ser 13.50 (*)    Hemoglobin 10.9 (*)    HCT 32.0 (*)    All other components within normal limits  ETHANOL  LACTIC ACID, PLASMA  SAMPLE TO BLOOD BANK    EKG None  Radiology CT HEAD WO CONTRAST  Result Date: 11/28/2022 CLINICAL DATA:  Fall injury with facial trauma, head and neck pain. EXAM: CT HEAD WITHOUT CONTRAST CT MAXILLOFACIAL WITHOUT CONTRAST CT CERVICAL SPINE WITHOUT CONTRAST TECHNIQUE: Multidetector CT imaging of the head, cervical spine, and maxillofacial structures were performed using the standard protocol without intravenous contrast. Multiplanar CT image reconstructions of the cervical spine and maxillofacial structures were also generated. RADIATION DOSE REDUCTION: This exam was performed according to the departmental dose-optimization program which includes automated exposure control, adjustment of the mA and/or kV according to patient size and/or use of iterative reconstruction technique. COMPARISON:  Head CT of 08/14/2022. No prior cross-sectional imaging of the cervical spine or plain films. FINDINGS: CT HEAD FINDINGS Brain: There is moderate cerebral atrophy with atrophic ventriculomegaly and mild small vessel disease of the cerebral white matter. Relatively mild cerebellar atrophy. No new asymmetry is seen concerning for an acute infarct, hemorrhage or mass. There is no midline  shift. The basal cisterns are clear. Vascular: There are patchy calcifications of the carotid siphons. There are no hyperdense central vessels. Skull: No fracture or focal lesion is seen. There is no appreciable scalp hematoma. Other: None. CT MAXILLOFACIAL FINDINGS Osseous: There is an acute comminuted and slightly depressed fracture of the right side of the nasal bone, and a minimally displaced buckle fracture in the nasal septum in the base of the perpendicular plate of the ethmoid bone and in its midportion. Because of the fractures, the nasal septum is increasingly deviated to the right. No mandibular or dental fracture is seen. There is interval partial anterior subluxation of the mandibular condyles within the TMJ fossae with asymmetric bone-on-bone joint space loss and remodeling of the left TMJ. All sinus walls are intact with no further fractures. No focal bone lesion or destructive process. Orbits: Negative. No traumatic or inflammatory finding. Sinuses: There is a hemorrhagic fluid level partially filling the left maxillary sinus. There is partial opacification of the anterior left ethmoid air cells. The other paranasal sinuses are clear. No mastoid effusion is seen. There are intact nasal turbinates. Soft tissues: There is moderate swelling over the nose and upper lip. There appears to be an upper lip laceration left of the midline. There are moderate calcifications in the proximal cervical right ICA. CT CERVICAL SPINE FINDINGS Alignment: There is trace retrolisthesis C6-7 most likely due to discogenic degenerative arthrosis. No traumatic or further listhesis is seen. There is bone-on-bone anterior atlantodental joint space loss with osteophytes. Skull base and vertebrae: There is normal bone mineralization. No fracture is evident or primary pathologic process. There is vertebral body ankylosis across the collapsed disc space of C5-6 and ankylosis across the left facet joints at this level. 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 Soft tissues and spinal canal: No prevertebral fluid or  swelling. No visible canal hematoma. No laryngeal mass is seen. Disc levels: As above there is chronic ankylosis across the collapsed C5-6 disc space. Chronic disc collapse is also noted at C6-7 and C7-T1, with C6-7 demonstrating bidirectional osteophytes, spinal canal stenosis and at least mild spondylotic cord compression. The other discs are normal in heights but there are posterior disc osteophyte complexes at C2-3 and C3-4 as well, with mild effacement of the ventral cord surface. There are left-greater-than-right uncinate and facet hypertrophic changes with acquired foraminal stenosis which is moderate on the left at C2-3, bilaterally moderate at C3-4, severe on the left and moderate on the right at C4-5, bilaterally moderate C5-6, and severe on the right and moderate to severe on the left at C6-7. Upper chest: Negative. Other: None. IMPRESSION: 1. No acute intracranial CT findings or depressed skull fractures. 2. Atrophy and small-vessel disease. 3. Vascular calcifications. 4. Acute comminuted and slightly depressed fracture of the right side of the nasal bone, with minimally displaced buckle fractures of the proximal and mid nasal septum. 5. Hemorrhagic fluid level in the left maxillary sinus. 6. Partial anterior subluxation of the mandibular condyles within the TMJ fossae, with chronic asymmetric bone-on-bone joint space loss and remodeling of the left TMJ. 7. Cervical spine degenerative changes without evidence of fractures or traumatic listhesis. 8. 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 9. 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 Short cervical pedicles. 10. Remaining findings described above. Electronically Signed   By: Telford Nab M.D.   On: 11/28/2022 06:35   CT MAXILLOFACIAL WO CONTRAST  Result  Date: 11/28/2022 CLINICAL DATA:  Fall injury with facial trauma, head and neck pain. EXAM: CT HEAD WITHOUT CONTRAST CT MAXILLOFACIAL WITHOUT CONTRAST CT CERVICAL SPINE WITHOUT CONTRAST TECHNIQUE: Multidetector CT imaging of the head, cervical spine, and maxillofacial structures were performed using the standard protocol without intravenous contrast. Multiplanar CT image reconstructions of the cervical spine and maxillofacial structures were also generated. RADIATION DOSE REDUCTION: This exam was performed according to the departmental dose-optimization program which includes automated exposure control, adjustment of the mA and/or kV according to patient size and/or use of iterative reconstruction technique. COMPARISON:  Head CT of 08/14/2022. No prior cross-sectional imaging of the cervical spine or plain films. FINDINGS: CT HEAD FINDINGS Brain: There is moderate cerebral atrophy with atrophic ventriculomegaly and mild small vessel disease of the cerebral white matter. Relatively mild cerebellar atrophy. No new asymmetry is seen concerning for an acute infarct, hemorrhage or mass. There is no midline shift. The basal cisterns are clear. Vascular: There are patchy calcifications of the carotid siphons. There are no hyperdense central vessels. Skull: No fracture or focal lesion is seen. There is no appreciable scalp hematoma. Other: None. CT MAXILLOFACIAL FINDINGS Osseous: There is an acute comminuted and slightly depressed fracture of the right side of the nasal bone, and a minimally displaced buckle fracture in the nasal septum in the base of the perpendicular plate of the ethmoid bone and in its midportion. Because of the fractures, the nasal septum is increasingly deviated to the right. No mandibular or dental fracture is seen. There is interval partial anterior subluxation of the mandibular condyles within the TMJ fossae with asymmetric bone-on-bone joint space loss and remodeling of the left TMJ. All sinus walls  are intact with no further fractures. No focal bone lesion or destructive process. Orbits: Negative. No traumatic or inflammatory finding. Sinuses: There is a hemorrhagic fluid level partially  filling the left maxillary sinus. There is partial opacification of the anterior left ethmoid air cells. The other paranasal sinuses are clear. No mastoid effusion is seen. There are intact nasal turbinates. Soft tissues: There is moderate swelling over the nose and upper lip. There appears to be an upper lip laceration left of the midline. There are moderate calcifications in the proximal cervical right ICA. CT CERVICAL SPINE FINDINGS Alignment: There is trace retrolisthesis C6-7 most likely due to discogenic degenerative arthrosis. No traumatic or further listhesis is seen. There is bone-on-bone anterior atlantodental joint space loss with osteophytes. Skull base and vertebrae: There is normal bone mineralization. No fracture is evident or primary pathologic process. There is vertebral body ankylosis across the collapsed disc space of C5-6 and ankylosis across the left facet joints at this level. 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 Soft tissues and spinal canal: No prevertebral fluid or swelling. No visible canal hematoma. No laryngeal mass is seen. Disc levels: As above there is chronic ankylosis across the collapsed C5-6 disc space. Chronic disc collapse is also noted at C6-7 and C7-T1, with C6-7 demonstrating bidirectional osteophytes, spinal canal stenosis and at least mild spondylotic cord compression. The other discs are normal in heights but there are posterior disc osteophyte complexes at C2-3 and C3-4 as well, with mild effacement of the ventral cord surface. There are left-greater-than-right uncinate and facet hypertrophic changes with acquired foraminal stenosis which is moderate on the left at C2-3, bilaterally moderate at C3-4, severe on the left and  moderate on the right at C4-5, bilaterally moderate C5-6, and severe on the right and moderate to severe on the left at C6-7. Upper chest: Negative. Other: None. IMPRESSION: 1. No acute intracranial CT findings or depressed skull fractures. 2. Atrophy and small-vessel disease. 3. Vascular calcifications. 4. Acute comminuted and slightly depressed fracture of the right side of the nasal bone, with minimally displaced buckle fractures of the proximal and mid nasal septum. 5. Hemorrhagic fluid level in the left maxillary sinus. 6. Partial anterior subluxation of the mandibular condyles within the TMJ fossae, with chronic asymmetric bone-on-bone joint space loss and remodeling of the left TMJ. 7. Cervical spine degenerative changes without evidence of fractures or traumatic listhesis. 8. 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 9. 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 Short cervical pedicles. 10. Remaining findings described above. Electronically Signed   By: Telford Nab M.D.   On: 11/28/2022 06:35   CT CERVICAL SPINE WO CONTRAST  Result Date: 11/28/2022 CLINICAL DATA:  Fall injury with facial trauma, head and neck pain. EXAM: CT HEAD WITHOUT CONTRAST CT MAXILLOFACIAL WITHOUT CONTRAST CT CERVICAL SPINE WITHOUT CONTRAST TECHNIQUE: Multidetector CT imaging of the head, cervical spine, and maxillofacial structures were performed using the standard protocol without intravenous contrast. Multiplanar CT image reconstructions of the cervical spine and maxillofacial structures were also generated. RADIATION DOSE REDUCTION: This exam was performed according to the departmental dose-optimization program which includes automated exposure control, adjustment of the mA and/or kV according to patient size and/or use of iterative reconstruction technique. COMPARISON:  Head CT of 08/14/2022. No prior cross-sectional imaging of the cervical spine or plain films.  FINDINGS: CT HEAD FINDINGS Brain: There is moderate cerebral atrophy with atrophic ventriculomegaly and mild small vessel disease of the cerebral white matter. Relatively mild cerebellar atrophy. No new asymmetry is seen concerning for an acute infarct, hemorrhage or mass. There  is no midline shift. The basal cisterns are clear. Vascular: There are patchy calcifications of the carotid siphons. There are no hyperdense central vessels. Skull: No fracture or focal lesion is seen. There is no appreciable scalp hematoma. Other: None. CT MAXILLOFACIAL FINDINGS Osseous: There is an acute comminuted and slightly depressed fracture of the right side of the nasal bone, and a minimally displaced buckle fracture in the nasal septum in the base of the perpendicular plate of the ethmoid bone and in its midportion. Because of the fractures, the nasal septum is increasingly deviated to the right. No mandibular or dental fracture is seen. There is interval partial anterior subluxation of the mandibular condyles within the TMJ fossae with asymmetric bone-on-bone joint space loss and remodeling of the left TMJ. All sinus walls are intact with no further fractures. No focal bone lesion or destructive process. Orbits: Negative. No traumatic or inflammatory finding. Sinuses: There is a hemorrhagic fluid level partially filling the left maxillary sinus. There is partial opacification of the anterior left ethmoid air cells. The other paranasal sinuses are clear. No mastoid effusion is seen. There are intact nasal turbinates. Soft tissues: There is moderate swelling over the nose and upper lip. There appears to be an upper lip laceration left of the midline. There are moderate calcifications in the proximal cervical right ICA. CT CERVICAL SPINE FINDINGS Alignment: There is trace retrolisthesis C6-7 most likely due to discogenic degenerative arthrosis. No traumatic or further listhesis is seen. There is bone-on-bone anterior atlantodental  joint space loss with osteophytes. Skull base and vertebrae: There is normal bone mineralization. No fracture is evident or primary pathologic process. There is vertebral body ankylosis across the collapsed disc space of C5-6 and ankylosis across the left facet joints at this level. 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 Soft tissues and spinal canal: No prevertebral fluid or swelling. No visible canal hematoma. No laryngeal mass is seen. Disc levels: As above there is chronic ankylosis across the collapsed C5-6 disc space. Chronic disc collapse is also noted at C6-7 and C7-T1, with C6-7 demonstrating bidirectional osteophytes, spinal canal stenosis and at least mild spondylotic cord compression. The other discs are normal in heights but there are posterior disc osteophyte complexes at C2-3 and C3-4 as well, with mild effacement of the ventral cord surface. There are left-greater-than-right uncinate and facet hypertrophic changes with acquired foraminal stenosis which is moderate on the left at C2-3, bilaterally moderate at C3-4, severe on the left and moderate on the right at C4-5, bilaterally moderate C5-6, and severe on the right and moderate to severe on the left at C6-7. Upper chest: Negative. Other: None. IMPRESSION: 1. No acute intracranial CT findings or depressed skull fractures. 2. Atrophy and small-vessel disease. 3. Vascular calcifications. 4. Acute comminuted and slightly depressed fracture of the right side of the nasal bone, with minimally displaced buckle fractures of the proximal and mid nasal septum. 5. Hemorrhagic fluid level in the left maxillary sinus. 6. Partial anterior subluxation of the mandibular condyles within the TMJ fossae, with chronic asymmetric bone-on-bone joint space loss and remodeling of the left TMJ. 7. Cervical spine degenerative changes without evidence of fractures or traumatic listhesis. 8. Chronic ankylosis across the  C5-6 disc space and left facet joints. 9. 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 Short cervical pedicles. 10. Remaining findings described above. Electronically Signed   By: Telford Nab M.D.   On: 11/28/2022 06:35  DG Chest Port 1 View  Result Date: 11/28/2022 CLINICAL DATA:  73 year old male with history of trauma from a fall. EXAM: PORTABLE CHEST 1 VIEW COMPARISON:  Chest x-ray 08/14/2022. FINDINGS: Lung volumes are low. Ill-defined opacity at the left base which may reflect atelectasis and/or consolidation. Possible small left pleural effusion. Right lung appears clear. No right pleural effusion. No pneumothorax. No evidence of pulmonary edema. Heart size is normal. Upper mediastinal contours are within normal limits. IMPRESSION: 1. Low lung volumes with ill-defined opacity at the left lung base which may reflect atelectasis and/or consolidation, likely with superimposed small left pleural effusion. Electronically Signed   By: Vinnie Langton M.D.   On: 11/28/2022 06:16   DG Pelvis Portable  Result Date: 11/28/2022 CLINICAL DATA:  72 year old male with history of trauma from a fall. EXAM: PORTABLE PELVIS 1-2 VIEWS COMPARISON:  No priors. FINDINGS: There is no evidence of pelvic fracture or diastasis. No pelvic bone lesions are seen. Joint space narrowing, subchondral sclerosis, subchondral cyst formation and osteophyte formation is noted in the hip joints bilaterally. IMPRESSION: 1. No acute radiographic abnormality of the bony pelvis. 2. Moderate bilateral hip joint osteoarthritis. Electronically Signed   By: Vinnie Langton M.D.   On: 11/28/2022 06:15    Procedures .Marland KitchenLaceration Repair  Date/Time: 11/28/2022 7:29 AM  Performed by: Emeline Darling, PA-C Authorized by: Emeline Darling, PA-C   Consent:    Consent obtained:  Verbal   Consent given by:  Patient   Risks discussed:  Nerve damage, poor cosmetic result, poor wound healing and vascular  damage   Alternatives discussed:  No treatment Universal protocol:    Patient identity confirmed:  Verbally with patient Anesthesia:    Anesthesia method:  Topical application   Topical anesthetic:  LET Laceration details:    Location:  Lip   Lip location:  Upper interior lip   Length (cm):  2 Pre-procedure details:    Preparation:  Patient was prepped and draped in usual sterile fashion and imaging obtained to evaluate for foreign bodies Exploration:    Imaging outcome: foreign body not noted   Treatment:    Area cleansed with:  Saline   Amount of cleaning:  Standard Skin repair:    Repair method:  Sutures   Suture size:  5-0   Wound skin closure material used: Vicryl.   Suture technique:  Simple interrupted   Number of sutures:  3 Approximation:    Approximation:  Close   Vermilion border well-aligned: yes   Repair type:    Repair type:  Intermediate Post-procedure details:    Dressing:  Open (no dressing)     Medications Ordered in ED Medications  acetaminophen (TYLENOL) tablet 650 mg (650 mg Oral Given 11/28/22 0656)  lidocaine-EPINEPHrine-tetracaine (LET) topical gel (3 mLs Topical Given 11/28/22 0548)  Tdap (BOOSTRIX) injection 0.5 mL (0.5 mLs Intramuscular Given 11/28/22 5573)    ED Course/ Medical Decision Making/ A&P                             Medical Decision Making 72 year old male, fall on thinners.  Hypertensive on intake, vitals otherwise normal.  Cardiopulmonary exam is normal, abdominal exam is benign.  Exam of the head as above.  Abrasions and lacerations.  Amount and/or Complexity of Data Reviewed Labs: ordered.    Details: CBC without leukocytosis, mild anemia with hemoglobin of 10.5 near patient's baseline.  CMP with baseline creatinine of 12.4 due for  dialysis today only mildly elevated BUN to 32.  Electrolytes are normal.  Radiology: ordered.    Details:  Planes of the pelvis negative for acute osseous abnormality.  Plain film of the chest  negative for acute cardiopulmonary disease though he does have possible atelectasis in the left lung base question pleural effusion. CT head negative for acute intracranial abnormality acute comminuted inside prescription of the right side of the nasal bone in the minimally displaced buckle fractures of the proximal and mid nasal septum and.  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  Cervical spine Without acute fractures; C6-C7 spondylotic cord compression, mild.     Risk OTC drugs. Prescription drug management.   Laceration to the lip repaired as above, patient remains neurologically intact GCS of 15.  Imaging overall reassuring without acute findings.  Will recommend he follow-up with spine specialist for findings of mild cord compression at the cervical spine at C6-7.  Otherwise reassuring workup no indication for admission to the hospital at this time or further workup in the ED.  Clinical picture most consistent with mechanical fall without acute hemorrhage secondary to antiplatelet medications.  Jalin and his wife voiced understanding of his medical evaluation and treatment plan. Each of their questions answered to their expressed satisfaction.  Return precautions were given.  Patient is well-appearing, stable, and was discharged in good condition.  This chart was dictated using voice recognition software, Dragon. Despite the best efforts of this provider to proofread and correct errors, errors may still occur which can change documentation meaning.   Final Clinical Impression(s) / ED Diagnoses Final diagnoses:  Lip laceration, initial encounter  Traumatic injury of head, initial encounter    Rx / DC Orders ED Discharge Orders     None         Aura Dials 11/28/22 0732    Merryl Hacker, MD 11/29/22 [id number]

## 2022-12-02 ENCOUNTER — Emergency Department (HOSPITAL_COMMUNITY)
Admission: EM | Admit: 2022-12-02 | Discharge: 2022-12-02 | Disposition: A | Discharge status: Home / Self Care | Payer: Medicare HMO | Attending: Emergency Medicine | Admitting: Emergency Medicine

## 2022-12-02 ENCOUNTER — Emergency Department (HOSPITAL_COMMUNITY): Payer: Medicare HMO

## 2022-12-02 ENCOUNTER — Encounter (HOSPITAL_COMMUNITY): Payer: Self-pay | Admitting: *Deleted

## 2022-12-02 ENCOUNTER — Other Ambulatory Visit: Payer: Self-pay

## 2022-12-02 DIAGNOSIS — I120 Hypertensive chronic kidney disease with stage 5 chronic kidney disease or end stage renal disease: Secondary | ICD-10-CM | POA: Diagnosis not present

## 2022-12-02 DIAGNOSIS — R944 Abnormal results of kidney function studies: Secondary | ICD-10-CM | POA: Insufficient documentation

## 2022-12-02 DIAGNOSIS — N186 End stage renal disease: Secondary | ICD-10-CM | POA: Diagnosis not present

## 2022-12-02 DIAGNOSIS — Z7982 Long term (current) use of aspirin: Secondary | ICD-10-CM | POA: Diagnosis not present

## 2022-12-02 DIAGNOSIS — K921 Melena: Secondary | ICD-10-CM | POA: Diagnosis not present

## 2022-12-02 DIAGNOSIS — Z79899 Other long term (current) drug therapy: Secondary | ICD-10-CM | POA: Diagnosis not present

## 2022-12-02 DIAGNOSIS — Z992 Dependence on renal dialysis: Secondary | ICD-10-CM | POA: Diagnosis not present

## 2022-12-02 DIAGNOSIS — S022XXS Fracture of nasal bones, sequela: Secondary | ICD-10-CM | POA: Diagnosis not present

## 2022-12-02 DIAGNOSIS — W19XXXS Unspecified fall, sequela: Secondary | ICD-10-CM | POA: Diagnosis not present

## 2022-12-02 DIAGNOSIS — D649 Anemia, unspecified: Secondary | ICD-10-CM | POA: Diagnosis not present

## 2022-12-02 DIAGNOSIS — R0981 Nasal congestion: Secondary | ICD-10-CM | POA: Insufficient documentation

## 2022-12-02 DIAGNOSIS — R195 Other fecal abnormalities: Secondary | ICD-10-CM

## 2022-12-02 LAB — COMPREHENSIVE METABOLIC PANEL
ALT: 44 U/L (ref 0–44)
AST: 71 U/L — ABNORMAL HIGH (ref 15–41)
Albumin: 3.4 g/dL — ABNORMAL LOW (ref 3.5–5.0)
Alkaline Phosphatase: 78 U/L (ref 38–126)
Anion gap: 15 (ref 5–15)
BUN: 29 mg/dL — ABNORMAL HIGH (ref 8–23)
CO2: 29 mmol/L (ref 22–32)
Calcium: 8.9 mg/dL (ref 8.9–10.3)
Chloride: 91 mmol/L — ABNORMAL LOW (ref 98–111)
Creatinine, Ser: 11.26 mg/dL — ABNORMAL HIGH (ref 0.61–1.24)
GFR, Estimated: 4 mL/min — ABNORMAL LOW (ref >60)
Glucose, Bld: 96 mg/dL (ref 70–99)
Potassium: 3.5 mmol/L (ref 3.5–5.1)
Sodium: 135 mmol/L (ref 135–145)
Total Bilirubin: 1 mg/dL (ref 0.3–1.2)
Total Protein: 7.6 g/dL (ref 6.5–8.1)

## 2022-12-02 LAB — CBC WITH DIFFERENTIAL/PLATELET
Abs Immature Granulocytes: 0.03 10*3/uL (ref 0.00–0.07)
Basophils Absolute: 0 10*3/uL (ref 0.0–0.1)
Basophils Relative: 1 %
Eosinophils Absolute: 0.2 10*3/uL (ref 0.0–0.5)
Eosinophils Relative: 3 %
HCT: 28.1 % — ABNORMAL LOW (ref 39.0–52.0)
Hemoglobin: 9.4 g/dL — ABNORMAL LOW (ref 13.0–17.0)
Immature Granulocytes: 1 %
Lymphocytes Relative: 15 %
Lymphs Abs: 0.9 10*3/uL (ref 0.7–4.0)
MCH: 30.9 pg (ref 26.0–34.0)
MCHC: 33.5 g/dL (ref 30.0–36.0)
MCV: 92.4 fL (ref 80.0–100.0)
Monocytes Absolute: 0.7 10*3/uL (ref 0.1–1.0)
Monocytes Relative: 12 %
Neutro Abs: 4.1 10*3/uL (ref 1.7–7.7)
Neutrophils Relative %: 68 %
Platelets: 262 10*3/uL (ref 150–400)
RBC: 3.04 MIL/uL — ABNORMAL LOW (ref 4.22–5.81)
RDW: 15.9 % — ABNORMAL HIGH (ref 11.5–15.5)
WBC: 6 10*3/uL (ref 4.0–10.5)
nRBC: 0 % (ref 0.0–0.2)

## 2022-12-02 LAB — POC OCCULT BLOOD, ED: Fecal Occult Bld: POSITIVE — AB

## 2022-12-02 LAB — LIPASE, BLOOD: Lipase: 53 U/L — ABNORMAL HIGH (ref 11–51)

## 2022-12-02 NOTE — ED Notes (Signed)
AVS reviewed with pt prior to discharge. Pt verbalizes understanding. Belongings with pt upon depart. Ambulatory to POV with family.

## 2022-12-02 NOTE — ED Notes (Signed)
Rectal exam performed by Athena Masse, PA chaperoned by this RN at bedside. Patient tolerated well.

## 2022-12-02 NOTE — Discharge Instructions (Addendum)
At this time there does not appear to be the presence of an emergent medical condition, however there is always the potential for conditions to change. Please read and follow the below instructions.  Please return to the Emergency Department immediately for any new or worsening symptoms. Please be sure to follow up with your Primary Care Provider within one week regarding your visit today; please call their office to schedule an appointment even if you are feeling better for a follow-up visit. Please go to your appointment with the ear nose and throat specialist Dr. Janace Hoard for further treatment of your broken nose. As we discussed there was some blood in your stool today this will need follow-up closely with your primary care provider for recheck and further management.  Additionally your hemoglobin level was slightly low today and your AST level (a liver test) was slightly high please have both of these rechecked by your primary care provider this week as well. If you develop any chest pain or shortness of breath or any other concerning symptoms please call 911 and return immediately to the emergency department.   Please read the additional information packets attached to your discharge summary.  Go to the nearest Emergency Department immediately if: You have fever or chills You have clear fluid draining out of your nose. You have a swelling on the inside of your nose that does not get better. You have trouble moving your eyes. You keep vomiting. You have any new/concerning or worsening of symptoms.  Do not take your medicine if  develop an itchy rash, swelling in your mouth or lips, or difficulty breathing; call 911 and seek immediate emergency medical attention if this occurs.  You may review your lab tests and imaging results in their entirety on your MyChart account.  Please discuss all results of fully with your primary care provider and other specialist at your follow-up visit.  Note:  Portions of this text may have been transcribed using voice recognition software. Every effort was made to ensure accuracy; however, inadvertent computerized transcription errors may still be present.

## 2022-12-02 NOTE — ED Provider Notes (Signed)
[facility] Provider Note   CSN: [id number] Arrival date & time: 12/02/22  [id number]     History  Chief Complaint  Patient presents with   Respiratory Distress    Jesus Ayala is a 72 y.o. male history includes ESRD T/TH/SA, hypertension, hyperlipidemia, GERD, anemia, arthritis.  Patient presented to the ER today for difficulty breathing through his nose that has been a problem for him for the past 3 days.  He reports he fell and fractured his nose on Thursday, he has called ENT and has a follow-up appointment on the 13th of this month.  He was wondering if there is anything to do about the difficulty breathing through his nose.  Patient denies chest pain/shortness of breath, cough, fever, chills, abdominal pain, nausea, vomiting. HPI     Home Medications Prior to Admission medications   Medication Sig Start Date End Date Taking? Authorizing Provider  acetaminophen (TYLENOL) 500 MG tablet Take 1,000 mg by mouth every 6 (six) hours as needed (pain).     [provider]  aspirin 81 MG chewable tablet Chew 81 mg by mouth every other day.     [provider]  B Complex-C-Zn-Folic Acid (DIALYVITE [id number] 15 PO) Take 1 tablet by mouth daily at 3 pm.    [provider]  calcium acetate (PHOSLO) 667 MG capsule Take 1 capsule (667 mg total) by mouth 3 (three) times daily with meals. Patient taking differently: Take 667 mg by mouth See admin instructions. Take 667 mg with each meal and snack 04/22/19   Lavina Hamman, MD  diphenhydrAMINE (BENADRYL) 25 MG tablet Take 25 mg by mouth every other day. Allergies/ itching    [provider]  Ensure (ENSURE) Take 237 mLs by mouth daily as needed (supplement).    [provider]  famotidine (PEPCID) 20 MG tablet Take 1 tablet (20 mg total) by mouth 2 (two) times daily. 06/08/20   Nuala Alpha A, PA-C  fluticasone (FLONASE) 50 MCG/ACT nasal spray Place 1  spray into both nostrils daily as needed for allergies.  01/14/20   [provider]  gabapentin (NEURONTIN) 300 MG capsule Take 300 mg by mouth 2 (two) times daily as needed (pain).  01/14/20   [provider]  hydrALAZINE (APRESOLINE) 25 MG tablet Take 1 tablet (25 mg total) by mouth 3 (three) times daily. 04/22/19   Lavina Hamman, MD  hydrocortisone cream 0.5 % Apply 1 application topically daily as needed for itching.    [provider]  latanoprost (XALATAN) 0.005 % ophthalmic solution Place 1 drop into both eyes at bedtime.    [provider]  lidocaine-prilocaine (EMLA) cream Apply 1 application topically as needed (port access).     [provider]  nitroGLYCERIN (NITROSTAT) 0.4 MG SL tablet Place 0.4 mg under the tongue every 5 (five) minutes as needed for chest pain.  03/27/19   [provider]  ondansetron (ZOFRAN) 4 MG tablet Take 1 tablet (4 mg total) by mouth every 6 (six) hours as needed for nausea or vomiting. 11/11/22   Redwine, Madison A, PA-C  pantoprazole (PROTONIX) 40 MG tablet Take 1 tablet (40 mg total) by mouth daily. 11/11/22   Redwine, Madison A, PA-C  triamcinolone ointment (KENALOG) 0.1 % SMARTSIG:1 Application Topical 2-3 Times Daily 03/26/22   [provider]      Allergies    Ace inhibitors, Enalapril, Iron sucrose, and Venofer  [ferric oxide]  Review of Systems   Review of Systems Ten systems are reviewed and are negative for acute change except as noted in the HPI  Physical Exam Updated Vital Signs BP (!) 119/91   Pulse 76   Temp 98.5 F (36.9 C)   Resp 18   Ht 5' 11"$  (1.803 m)   Wt 82.6 kg   SpO2 100%   BMI 25.38 kg/m  Physical Exam Constitutional:      General: He is not in acute distress.    Appearance: Normal appearance. He is well-developed. He is not ill-appearing or diaphoretic.  HENT:     Head: Normocephalic and atraumatic.     Jaw: There is normal jaw occlusion.      Right Ear:  External ear normal.     Left Ear: External ear normal.     Nose:     Comments: Dried epistaxis of the bilateral nares, swelling to the nose small abrasion overlying the nasal bridge.    Mouth/Throat:     Mouth: Mucous membranes are moist.     Pharynx: Oropharynx is clear.     Comments: Oropharynx clear. Eyes:     General: Vision grossly intact. Gaze aligned appropriately.     Extraocular Movements: Extraocular movements intact.     Conjunctiva/sclera: Conjunctivae normal.     Pupils: Pupils are equal, round, and reactive to light.  Neck:     Trachea: Trachea and phonation normal. No tracheal tenderness or tracheal deviation.  Cardiovascular:     Rate and Rhythm: Normal rate and regular rhythm.  Pulmonary:     Effort: Pulmonary effort is normal. No respiratory distress.     Breath sounds: Normal breath sounds and air entry.  Chest:     Chest wall: No tenderness.  Abdominal:     General: There is no distension.     Palpations: Abdomen is soft.     Tenderness: There is no abdominal tenderness. There is no guarding or rebound.  Genitourinary:    Comments: Rectal exam chaperoned by Danie Binder. No hemorrhoids or fissures.  Normal rectal tone.  No palpable internal hemorrhoids.  Light brown stool, no gross blood or melena. Musculoskeletal:        General: Normal range of motion.     Cervical back: Normal range of motion and neck supple.     Right lower leg: No edema.     Left lower leg: No edema.  Skin:    General: Skin is warm and dry.  Neurological:     Mental Status: He is alert.     GCS: GCS eye subscore is 4. GCS verbal subscore is 5. GCS motor subscore is 6.     Comments: Speech is clear and goal oriented, follows commands Major Cranial nerves without deficit, no facial droop Moves extremities without ataxia, coordination intact  Psychiatric:        Behavior: Behavior normal.     ED Results / Procedures / Treatments   Labs (all labs ordered are listed, but only  abnormal results are displayed) Labs Reviewed  CBC WITH DIFFERENTIAL/PLATELET - Abnormal; Notable for the following components:      Result Value   RBC 3.04 (*)    Hemoglobin 9.4 (*)    HCT 28.1 (*)    RDW 15.9 (*)    All other components within normal limits  COMPREHENSIVE METABOLIC PANEL - Abnormal; Notable for the following components:   Chloride 91 (*)    BUN 29 (*)    Creatinine, Ser  11.26 (*)    Albumin 3.4 (*)    AST 71 (*)    GFR, Estimated 4 (*)    All other components within normal limits  LIPASE, BLOOD - Abnormal; Notable for the following components:   Lipase 53 (*)    All other components within normal limits  POC OCCULT BLOOD, ED - Abnormal; Notable for the following components:   Fecal Occult Bld POSITIVE (*)    All other components within normal limits    EKG EKG Interpretation  Date/Time:  Sunday December 02 2022 06:33:11 EST Ventricular Rate:  86 PR Interval:  140 QRS Duration: 98 QT Interval:  390 QTC Calculation: 466 R Axis:   -50 Text Interpretation: Normal sinus rhythm Right atrial enlargement Left anterior fascicular block Moderate voltage criteria for LVH, may be normal variant ( R in aVL , Cornell product ) Nonspecific ST and T wave abnormality Abnormal ECG No previous ECGs available when compared to prior,  similar appearance. no STEMI Confirmed by Antony Blackbird [phone]) on 12/02/2022 7:19:04 AM  Radiology DG Chest Portable 1 View  Result Date: 12/02/2022 CLINICAL DATA:  Dialysis. EXAM: PORTABLE CHEST 1 VIEW COMPARISON:  11/28/2022 FINDINGS: Stable asymmetric elevation left hemidiaphragm. The lungs are clear without focal pneumonia, edema, pneumothorax or pleural effusion. The cardiopericardial silhouette is within normal limits for size. The visualized bony structures of the thorax are unremarkable. IMPRESSION: No active disease. Electronically Signed   By: Misty Stanley M.D.   On: 12/02/2022 09:56    Procedures Procedures    Medications  Ordered in ED Medications - No data to display  ED Course/ Medical Decision Making/ A&P Clinical Course as of 12/02/22 1032  Sun Dec 02, 2022  1024 DG Chest Portable 1 View I have personally reviewed and interpreted patient's 1 view chest x-ray I do not appreciate any obvious PTX, PNA, effusion or other acute cardiopulmonary process.  I agree with radiologist interpretation. [BM]  1025 CBC with Differential(!) CBC shows anemia of 9.4 which is around 1 point lower than 4 days ago.  Will need rechecked by PCP.  No complaint to suggest symptomatic anemia.  No leukocytosis to suggest acute infectious process.  No thrombocytopenia. [BM]  1025 Lipase, blood(!) Lipase minimally elevated at 53.  No symptoms to suggest acute pancreatitis.  Will encourage PCP recheck. [BM]  1026 Comprehensive metabolic panel(!) CMP shows elevation of creatinine which is near baseline given ESRD.  No emergent electrolyte derangement or gap.  Minimal elevation of AST at 71.  Will encourage PCP recheck. [BM]  1026 POC occult blood, ED(!) Hemoccult positive, this is likely due to swallowing some blood recently following his nasal fracture and lip laceration.  Will encourage recheck by PCP. [BM]    Clinical Course User Index [BM] Gari Crown                             Medical Decision Making 72 year old male presented with concern of nasal congestion, he suffered a comminuted nasal fracture a few days ago.  On exam I do not appreciate any evidence for infection.  Suspect this is normal nasal congestion after his fracture and he has a follow-up with ENT Dr. Janace Hoard this week which is appropriate for definitive management of this.  Patient did mention that he was concerned for possible blood in his stool and thinks it may be related to his recent nasal fracture and lip lacerations.  Basic labs were obtained  and reviewed as above, his hemoglobin is slightly lower than 4 days ago, he has no symptoms to suggest  symptomatic anemia.  He has no chest pain/SOB and his difficulty breathing that he reports is due to his nose being clogged and unable to breathe through it.  There is no indication for cardiac enzymes or further ER workup at this point.  I discussed strict ER precautions with patient and his wife today and they stated understanding at time of discharge patient is well-appearing and in no acute distress lying in bed.  Vital signs are stable on room air.  All questions were answered.  Patient is encouraged to have blood work and Hemoccult rechecked by PCP this week.   Amount and/or Complexity of Data Reviewed Labs: ordered. Decision-making details documented in ED Course. Radiology: ordered. Decision-making details documented in ED Course.   At this time there does not appear to be any evidence of an acute emergency medical condition and the patient appears stable for discharge with appropriate outpatient follow up. Diagnosis was discussed with patient who verbalizes understanding of care plan and is agreeable to discharge. I have discussed return precautions with patient who verbalizes understanding. Patient encouraged to follow-up with their PCP and ENT. All questions answered.  Patient's case discussed with Dr. Sherry Ruffing who agrees with plan to discharge with follow-up.   Note: Portions of this report may have been transcribed using voice recognition software. Every effort was made to ensure accuracy; however, inadvertent computerized transcription errors may still be present.         Final Clinical Impression(s) / ED Diagnoses Final diagnoses:  Nasal congestion  Closed fracture of nasal bone, sequela  Occult blood in stools    Rx / DC Orders ED Discharge Orders     None         Gari Crown 12/02/22 1032    Tegeler, Gwenyth Allegra, MD 12/02/22 [phone]

## 2022-12-02 NOTE — ED Triage Notes (Signed)
Thr pt was here ealrier after he fell and broke his nose  now he is c/o not being able to brerathe from his nose dialysis pt that was dialyzed yesterday dialysis fistula  rt arm

## 2023-01-10 ENCOUNTER — Ambulatory Visit (INDEPENDENT_AMBULATORY_CARE_PROVIDER_SITE_OTHER): Payer: Medicare HMO | Admitting: Podiatry

## 2023-01-10 ENCOUNTER — Encounter: Payer: Self-pay | Admitting: Podiatry

## 2023-01-10 DIAGNOSIS — B353 Tinea pedis: Secondary | ICD-10-CM | POA: Diagnosis not present

## 2023-01-10 MED ORDER — CLOTRIMAZOLE-BETAMETHASONE 1-0.05 % EX CREA: 1.0000 | TOPICAL_CREAM | Freq: Two times a day (BID) | CUTANEOUS | 0 refills | Status: DC

## 2023-01-13 NOTE — Progress Notes (Signed)
  Subjective:  Patient ID: Jesus Ayala, male    DOB: 04-18-1951,  MRN: [id number]  Chief Complaint  Patient presents with   Foot Ulcer    left heel wound - dry & cracked - little pain - both heels have fissures    72 y.o. male presents with the above complaint. History confirmed with patient.  Skin on both heels keeps cracking, does report some itching in his feet.  Objective:  Physical Exam: warm, good capillary refill, no trophic changes or ulcerative lesions, normal DP and PT pulses, normal sensory exam, and tinea pedis with fissuring of heels  Assessment:   1. Tinea pedis of both feet      Plan:  Patient was evaluated and treated and all questions answered.  Discussed the etiology and treatment options for tinea pedis.  Discussed topical and oral treatment.  Recommended topical treatment with   Lotrisone cream.  This was sent to the patient's pharmacy.  Also discussed appropriate foot hygiene, use of antifungal spray such as Tinactin in shoes, as well as cleaning her foot surfaces such as showers and bathroom floors with bleach.    Return in about 3 weeks (around 01/31/2023), or if symptoms worsen or fail to improve.

## 2023-01-24 ENCOUNTER — Other Ambulatory Visit: Payer: Self-pay | Admitting: Physician Assistant

## 2023-01-31 ENCOUNTER — Ambulatory Visit (INDEPENDENT_AMBULATORY_CARE_PROVIDER_SITE_OTHER): Payer: Medicare HMO | Admitting: Podiatry

## 2023-01-31 DIAGNOSIS — B353 Tinea pedis: Secondary | ICD-10-CM

## 2023-02-01 ENCOUNTER — Other Ambulatory Visit: Payer: Self-pay | Admitting: Podiatry

## 2023-02-03 MED ORDER — AMMONIUM LACTATE 12 % EX CREA: 1.0000 | TOPICAL_CREAM | CUTANEOUS | 0 refills | Status: AC | PRN

## 2023-02-03 NOTE — Progress Notes (Signed)
  Subjective:  Patient ID: Jesus Ayala, male    DOB: [birth year],  MRN: [id number]  Chief Complaint  Patient presents with   Tinea Pedis    3 week follow up bilateral    72 y.o. male presents with the above complaint. History confirmed with patient.  Itching resolved but the dry skin on the heels has not completely resolved  Objective:  Physical Exam: warm, good capillary refill, no trophic changes or ulcerative lesions, normal DP and PT pulses, normal sensory exam, and tinea pedis has resolved, there is still dry skin Assessment:   1. Tinea pedis of both feet      Plan:  Patient was evaluated and treated and all questions answered.  Continue utilizing the Lotrisone cream until the cracking improves, for the dry skin long-term I prescribed ammonium lactate cream.  Return to see me follow-up as needed if not improving or worsening    Return if symptoms worsen or fail to improve.

## 2023-02-04 ENCOUNTER — Telehealth: Payer: Self-pay | Admitting: *Deleted

## 2023-02-04 NOTE — Telephone Encounter (Signed)
Patient is calling to ask for a refill on the Lotrisone cream, feet are still cracking a little,please advise.

## 2023-02-05 ENCOUNTER — Telehealth: Payer: Self-pay | Admitting: *Deleted

## 2023-02-05 NOTE — Telephone Encounter (Signed)
Patient updated that  prescription was sent, verbalized that he has picked up.

## 2023-04-05 ENCOUNTER — Ambulatory Visit (INDEPENDENT_AMBULATORY_CARE_PROVIDER_SITE_OTHER): Payer: Medicare HMO | Admitting: Podiatry

## 2023-04-05 ENCOUNTER — Ambulatory Visit: Payer: Medicare HMO

## 2023-04-05 DIAGNOSIS — L84 Corns and callosities: Secondary | ICD-10-CM | POA: Diagnosis not present

## 2023-04-05 DIAGNOSIS — M779 Enthesopathy, unspecified: Secondary | ICD-10-CM

## 2023-04-05 NOTE — Progress Notes (Unsigned)
       Subjective:  Patient ID: Jesus Ayala, male    DOB: 04/13/1951,  MRN: [id number]  Jesus Ayala presents to clinic today for:  Chief Complaint  Patient presents with   Foot Problem    Dry and thick skin around the left heel. Patient was seen on April 2024 and was prescribed ammonium lactate cream but patient does not believe it is working.   [name]  PCP is Ezekiel Slocumb, PA-C.  Allergies  Allergen Reactions   Ace Inhibitors Swelling and Other (See Comments)   Enalapril Swelling    ANGIOEDEMA of lips   Methoxy Polyethylene Glycol-Epoetin Beta Itching   Iron Sucrose Itching   Venofer  [Ferric Oxide] Itching    Review of Systems: Negative except as noted in the HPI.  Objective: No changes noted in today's physical examination. There were no vitals filed for this visit.  Jesus Ayala is a pleasant 72 y.o. male in NAD. AAO x 3.  Vascular Examination: Capillary refill time is 3-5 seconds to toes bilateral. Palpable pedal pulses b/l LE. Digital hair present b/l. No pedal edema b/l. Skin temperature gradient WNL b/l. No varicosities b/l. No cyanosis or clubbing noted b/l.   Dermatological Examination: Pedal skin with normal turgor, texture and tone b/l. No open wounds. No interdigital macerations b/l.   Hyperkeratotic lesion present, with pain on palpation, located *** .  Neurological Examination: Protective sensation intact with Semmes-Weinstein 10 gram monofilament b/l LE. Vibratory sensation intact b/l LE.  Musculoskeletal Examination: Muscle strength 5/5 to all LE muscle groups b/l.       No data to display           Assessment/Plan: 1. Capsulitis     No orders of the defined types were placed in this encounter.  None  The hyperkeratotic lesions were sharply debrided/shaved with sterile #312 blade.  Discussed with the patient what is causing the corns/calluses and reviewed treatment options today, including shaving the the painful lesion(s),  off-loading techniques and pads, custom orthotics / shoe modifications.  If conservative treatment fails to provide enough relief, or if the frequency of recurrence increases, then will recommend surgical intervention to help prevent the painful skin lesions from recurring.    Return if symptoms worsen or fail to improve.   Clerance Lav, DPM, FACFAS [facility]     [address] [place], [place] [address]                Office [phone]  Fax [phone]

## 2023-04-07 DIAGNOSIS — L84 Corns and callosities: Secondary | ICD-10-CM | POA: Insufficient documentation

## 2023-05-20 ENCOUNTER — Ambulatory Visit: Payer: Medicare HMO

## 2023-05-20 ENCOUNTER — Ambulatory Visit (INDEPENDENT_AMBULATORY_CARE_PROVIDER_SITE_OTHER): Payer: Medicare HMO | Admitting: Podiatry

## 2023-05-20 DIAGNOSIS — M79672 Pain in left foot: Secondary | ICD-10-CM | POA: Diagnosis not present

## 2023-05-20 DIAGNOSIS — M79671 Pain in right foot: Secondary | ICD-10-CM

## 2023-05-20 DIAGNOSIS — M722 Plantar fascial fibromatosis: Secondary | ICD-10-CM

## 2023-05-20 MED ORDER — MELOXICAM 7.5 MG PO TABS: 7.5000 mg | ORAL_TABLET | Freq: Every day | ORAL | 0 refills | Status: DC

## 2023-05-20 NOTE — Patient Instructions (Signed)

## 2023-05-20 NOTE — Progress Notes (Signed)
Chief Complaint  Patient presents with   Foot Pain    HEEL PAIN BILAT, BEEN GOING ON FOR 1-2 WKS, TAKING TYLENOL AND PUTTING CREAM ON IT. PAIN LEVEL - 6, NO INJURY.   HPI: 72 y.o. male presenting today with c/o pain in the bottom of both heels.  Denies trauma.  States has been present for 1 to 2 weeks.  Denies bruising.  Past Medical History:  Diagnosis Date   Anemia    Anxiety    situational   Arthritis    Early cataracts, bilateral    ESRD (end stage renal disease) (HCC)    MWF- [facility] [place]   GERD (gastroesophageal reflux disease)    Glaucoma    HLD (hyperlipidemia)    Hypertension    Seasonal allergies    Wears glasses    Wears partial dentures     Past Surgical History:  Procedure Laterality Date   A/V FISTULAGRAM Right 06/03/2020   Procedure: A/V FISTULAGRAM;  Surgeon: Sherren Kerns, MD;  Location: [facility];  Service: Cardiovascular;  Laterality: Right;   AV FISTULA PLACEMENT Left 07/14/2019   Procedure: INSERTION OF ARTERIOVENOUS (AV) GORE-TEX GRAFT ARM;  Surgeon: Chuck Hint, MD;  Location: [facility] OR;  Service: Vascular;  Laterality: Left;   AV FISTULA PLACEMENT Right 02/23/2020   Procedure: RIGHT ARM ARTERIOVENOUS (AV) FISTULA CREATION;  Surgeon: Sherren Kerns, MD;  Location: [facility] OR;  Service: Vascular;  Laterality: Right;   AV FISTULA PLACEMENT Right 06/28/2020   Procedure: INSERTION OF RIGHT UPPER ARM ARTERIOVENOUS (AV) GORE-TEX GRAFT, Ligation of Right Brachial/Cephalic artery.;  Surgeon: Sherren Kerns, MD;  Location: [facility] OR;  Service: Vascular;  Laterality: Right;   COLONOSCOPY     HERNIA REPAIR     unbicial   INSERTION OF DIALYSIS CATHETER Right 06/25/2019   Procedure: INSERTION OF TUNNEL DIALYSIS CATHETER;  Surgeon: Maeola Harman, MD;  Location: [facility] OR;  Service: Vascular;  Laterality: Right;   IR THROMBECTOMY AV FISTULA W/THROMBOLYSIS/PTA INC/SHUNT/IMG LEFT Left 09/14/2019   IR US GUIDE VASC ACCESS LEFT   09/14/2019   MULTIPLE TOOTH EXTRACTIONS     TEE WITHOUT CARDIOVERSION N/A 08/31/2019   Procedure: TRANSESOPHAGEAL ECHOCARDIOGRAM (TEE);  Surgeon: Chrystie Nose, MD;  [facility] ENDOSCOPY;  Service: Cardiovascular;  Laterality: N/A;    Allergies  Allergen Reactions   Ace Inhibitors Swelling and Other (See Comments)   Enalapril Swelling    ANGIOEDEMA of lips   Methoxy Polyethylene Glycol-Epoetin Beta Itching   Iron Sucrose Itching   Venofer  [Ferric Oxide] Itching     Physical Exam: General: The patient is alert and oriented x3 in no acute distress.  Dermatology:  No ecchymosis, erythema, or edema bilateral.  No open lesions.    Vascular: Palpable pedal pulses bilaterally. Capillary refill within normal limits.  No appreciable edema.    Neurological: Light touch sensation intact bilateral.  MMT 5/5 to lower extremity bilateral. Negative Tinel's sign with percussion of the posterior tibial nerve on the affected extremity.    Musculoskeletal Exam:  There is pain on palpation of the plantarmedial & plantarcentral aspect of both heels.  No gaps or nodules within the plantar fascia.  Positive Windlass mechanism bilateral.  Antalgic gait noted with first few steps upon standing.  No pain on palpation of achilles tendon bilateral.  Ankle df less than 10 degrees with knee extended b/l.  Radiographic Exam (bilateral foot, 3 weightbearing views, 05/20/2023):  Normal osseous mineralization.  No calcaneal spurring noted.  No fracture seen.  Uneven joint space narrowing at the first metatarsophalangeal joint bilateral  Assessment/Plan of Care: 1. Plantar fasciitis, bilateral   2. Pain of both heels     Meds ordered this encounter  Medications   meloxicam (MOBIC) 7.5 MG tablet    Sig: Take 1 tablet (7.5 mg total) by mouth daily.    Dispense:  30 tablet    Refill:  0   DG FOOT COMPLETE LEFT  -Reviewed etiology of plantar fasciitis with patient.  Discussed treatment options with patient  today, including cortisone injection, NSAID course of treatment, stretching exercises, physical therapy, use of night splint, rest, icing the heel, arch supports/orthotics, and supportive shoe gear.    Patient refused a corticosteroid injection today.  Dispensed elastic ankle sleeves bilateral.  Refused power step arch supports today.  Calf stretching exercises were printed out and given to patient.  Return if symptoms worsen or fail to improve.   Clerance Lav, DPM, FACFAS [facility]     [address] [place], [place] [address]                Office [phone]  Fax [phone]

## 2023-05-21 ENCOUNTER — Other Ambulatory Visit: Payer: Self-pay | Admitting: Podiatry

## 2023-05-21 DIAGNOSIS — M722 Plantar fascial fibromatosis: Secondary | ICD-10-CM

## 2023-05-21 DIAGNOSIS — M79671 Pain in right foot: Secondary | ICD-10-CM

## 2023-06-16 ENCOUNTER — Other Ambulatory Visit: Payer: Self-pay | Admitting: Podiatry

## 2024-09-22 ENCOUNTER — Encounter: Payer: Self-pay | Admitting: Podiatry

## 2024-09-22 ENCOUNTER — Ambulatory Visit: Admitting: Podiatry

## 2024-09-22 ENCOUNTER — Ambulatory Visit (INDEPENDENT_AMBULATORY_CARE_PROVIDER_SITE_OTHER)

## 2024-09-22 DIAGNOSIS — M722 Plantar fascial fibromatosis: Secondary | ICD-10-CM

## 2024-09-22 NOTE — Patient Instructions (Signed)

## 2024-09-22 NOTE — Progress Notes (Unsigned)
 Chief Complaint  Patient presents with   Plantar Fasciitis    Left foot heel pain x 3 weeks. 7 pain. Taking Tyenol and compression socks. Non diabetic.   Discussed the use of AI scribe software for clinical note transcription with the patient, who gave verbal consent to proceed.  History of Present Illness Jesus Ayala is a 73 year old male with a history of kidney transplant who presents with plantar fasciitis in the left foot.  He experiences pain primarily at the bottom of the left heel, sometimes radiating into the arch, with severity reaching up to 9/10. The pain is not constant and is not currently severe. He has a history of a fibroma within the plantar fascia, which he reports was previously checked out and told to him as a cyst or something similar.  He has not been using effective arch supports due to work shoe constraints and has not been performing stretching exercises. Pain management includes Tylenol , limited by his kidney transplant. He is on steroids, which he reports have provided some relief, and takes gabapentin . He also takes baby aspirin .  He works three to four days a week.  No pain in the ball of the foot. Reports a little soreness at the back of the heel and tightness in flexibility. No major pain elsewhere in the foot.     Past Medical History:  Diagnosis Date   Anemia    Anxiety    situational   Arthritis    Early cataracts, bilateral    ESRD (end stage renal disease) (HCC)    MWF- [facility] [place]   GERD (gastroesophageal reflux disease)    Glaucoma    HLD (hyperlipidemia)    Hypertension    Seasonal allergies    Wears glasses    Wears partial dentures    Past Surgical History:  Procedure Laterality Date   A/V FISTULAGRAM Right 06/03/2020   Procedure: A/V FISTULAGRAM;  Surgeon: Harvey Carlin BRAVO, MD;  Location: [facility];  Service: Cardiovascular;  Laterality: Right;   AV FISTULA PLACEMENT Left 07/14/2019   Procedure: INSERTION  OF ARTERIOVENOUS (AV) GORE-TEX GRAFT ARM;  Surgeon: Eliza Lonni RAMAN, MD;  Location: [facility] OR;  Service: Vascular;  Laterality: Left;   AV FISTULA PLACEMENT Right 02/23/2020   Procedure: RIGHT ARM ARTERIOVENOUS (AV) FISTULA CREATION;  Surgeon: Harvey Carlin BRAVO, MD;  Location: [facility] OR;  Service: Vascular;  Laterality: Right;   AV FISTULA PLACEMENT Right 06/28/2020   Procedure: INSERTION OF RIGHT UPPER ARM ARTERIOVENOUS (AV) GORE-TEX GRAFT, Ligation of Right Brachial/Cephalic artery.;  Surgeon: Harvey Carlin BRAVO, MD;  Location: [facility] OR;  Service: Vascular;  Laterality: Right;   COLONOSCOPY     HERNIA REPAIR     unbicial   INSERTION OF DIALYSIS CATHETER Right 06/25/2019   Procedure: INSERTION OF TUNNEL DIALYSIS CATHETER;  Surgeon: Sheree Penne Lonni, MD;  Location: [facility] OR;  Service: Vascular;  Laterality: Right;   IR THROMBECTOMY AV FISTULA W/THROMBOLYSIS/PTA INC/SHUNT/IMG LEFT Left 09/14/2019   IR US  GUIDE VASC ACCESS LEFT  09/14/2019   MULTIPLE TOOTH EXTRACTIONS     TEE WITHOUT CARDIOVERSION N/A 08/31/2019   Procedure: TRANSESOPHAGEAL ECHOCARDIOGRAM (TEE);  Surgeon: Mona Vinie BROCKS, MD;  Location: [facility] ENDOSCOPY;  Service: Cardiovascular;  Laterality: N/A;   Allergies  Allergen Reactions   Ace Inhibitors Swelling and Other (See Comments)   Enalapril Swelling    ANGIOEDEMA of lips   Methoxy Polyethylene Glycol-Epoetin  Beta Itching   Iron Sucrose  Itching   Venofer   [Ferric Oxide]  Itching    Physical Exam Tenderness on palpation at the plantar medial and plantar central portion of the left heel. Tightness and well-defined plantar fibroma in the plantar fascia of the left foot along the medial plantar fascial band.  There is also pain on palpation of the fibroma.  There is no surrounding erythema.  Palpable pedal pulses are noted.  No open lesions are noted.  No ecchymosis is identified.  Manual muscle testing 5/5.  Epicritic sensation intact.   Radiology: Left foot, 3 weightbearing views,  09/22/2024: Normal osseous mineralization.  No inferior calcaneal spur identified.  No fracture seen. Results Procedure: Corticosteroid injection Description: Cold spray applied to the foot.  Administration of a mixture of 1% lidocaine  plain, 0.5% Marcaine plain, and Kenalog  10 were administered to the plantar aspect of the left heel for a total of 1.2 cc administered.  Patient tolerated this well and a Band-Aid was applied.  He can remove this later today.    Assessment/Plan of Care: 1. Plantar fasciitis of left foot   2. Plantar fascial fibromatosis of left foot     FOR HOME USE ONLY DME POWER STEP INSERTS Assessment & Plan Plantar fasciitis of the left foot Chronic plantar fasciitis of the left foot with pain primarily at the bottom of the left heel, extending into the arch. Pain severity reaches up to 9/10. Current management includes Tylenol  and gabapentin , with limited relief. Steroids have provided some improvement. No significant improvement with current arch support. He prefers cortisone injection and arch supports. - Administered cortisone injection to the left foot. - Provided PowerStep inserts for arch support. - Printed stretching exercises for plantar fasciitis. - Advised to discuss gabapentin  dose increase with prescribing doctor. - Recommended extra strength Tylenol  for pain management. - Instructed to wear compressive socks during the day, not at night.  Plantar fibroma of the left foot Presence of a plantar fibroma within the plantar fascia ligament. The fibroma may fluctuate in size and can sometimes resolve spontaneously. Cortisone injection may help shrink the fibroma. - Administered cortisone injection to address plantar fasciitis but not directly into the fibroma.  Will hold off on the fibroma at this time.        [name], DPM, FACFAS [facility]     [address] [place], [place] [address]                 Office [phone]  Fax [phone]

## 2024-09-23 ENCOUNTER — Telehealth: Payer: Self-pay | Admitting: Lab

## 2024-09-23 NOTE — Telephone Encounter (Signed)
 Patient was needing to know what type of injection he received in our office on his visit and a summary so he can take it to his kidney doctor he is a transplant recipient and needs all info to go to his provider.

## 2024-10-07 ENCOUNTER — Telehealth: Payer: Self-pay

## 2024-10-07 NOTE — Telephone Encounter (Signed)
 Patient called stating that his foot has not gotten any better. He would ike to know if there is something he can take or if he needs to come back to be seen.

## 2024-10-08 ENCOUNTER — Other Ambulatory Visit: Payer: Self-pay | Admitting: Podiatry

## 2024-10-08 DIAGNOSIS — M722 Plantar fascial fibromatosis: Secondary | ICD-10-CM

## 2024-10-08 NOTE — Progress Notes (Signed)
 Order placed for PT for plantar fasciitis bilateral

## 2024-11-04 ENCOUNTER — Encounter: Payer: Self-pay | Admitting: Physical Therapy

## 2024-11-04 ENCOUNTER — Ambulatory Visit: Attending: Neurology | Admitting: Physical Therapy

## 2024-11-04 ENCOUNTER — Other Ambulatory Visit: Payer: Self-pay

## 2024-11-04 DIAGNOSIS — G8929 Other chronic pain: Secondary | ICD-10-CM | POA: Insufficient documentation

## 2024-11-04 DIAGNOSIS — R2689 Other abnormalities of gait and mobility: Secondary | ICD-10-CM | POA: Insufficient documentation

## 2024-11-04 DIAGNOSIS — M25562 Pain in left knee: Secondary | ICD-10-CM | POA: Diagnosis present

## 2024-11-04 DIAGNOSIS — M79672 Pain in left foot: Secondary | ICD-10-CM | POA: Insufficient documentation

## 2024-11-04 DIAGNOSIS — M6281 Muscle weakness (generalized): Secondary | ICD-10-CM | POA: Diagnosis present

## 2024-11-04 DIAGNOSIS — M722 Plantar fascial fibromatosis: Secondary | ICD-10-CM | POA: Diagnosis not present
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
## 2024-11-16 ENCOUNTER — Ambulatory Visit
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
## 2024-11-19 ENCOUNTER — Ambulatory Visit: Admitting: Physical Therapy

## 2024-11-19 ENCOUNTER — Encounter: Payer: Self-pay | Admitting: Physical Therapy

## 2024-11-19 DIAGNOSIS — M79672 Pain in left foot: Secondary | ICD-10-CM | POA: Diagnosis not present

## 2024-11-19 DIAGNOSIS — R2689 Other abnormalities of gait and mobility: Secondary | ICD-10-CM

## 2024-11-19 DIAGNOSIS — M6281 Muscle weakness (generalized): Secondary | ICD-10-CM

## 2024-11-19 DIAGNOSIS — G8929 Other chronic pain: Secondary | ICD-10-CM

## 2024-11-23 ENCOUNTER — Ambulatory Visit

## 2024-11-26 ENCOUNTER — Ambulatory Visit

## 2024-12-01 ENCOUNTER — Ambulatory Visit

## 2024-12-08 ENCOUNTER — Ambulatory Visit

## 2024-12-16 ENCOUNTER — Ambulatory Visit

## 2024-12-23 ENCOUNTER — Ambulatory Visit
